# Patient Record
Sex: Female | Born: 1945 | Race: White | Hispanic: No | Marital: Married | Smoking: Former smoker
Health system: Southern US, Community
[De-identification: ages and names within clinical notes are randomized; demographics above are authoritative.]

## PROBLEM LIST (undated history)

## (undated) DIAGNOSIS — E119 Type 2 diabetes mellitus without complications: Secondary | ICD-10-CM

## (undated) DIAGNOSIS — N189 Chronic kidney disease, unspecified: Secondary | ICD-10-CM

## (undated) DIAGNOSIS — I10 Essential (primary) hypertension: Secondary | ICD-10-CM

## (undated) HISTORY — PX: BACK SURGERY: SHX140

## (undated) HISTORY — DX: Chronic kidney disease, unspecified: N18.9

## (undated) HISTORY — DX: Type 2 diabetes mellitus without complications: E11.9

## (undated) HISTORY — PX: BREAST BIOPSY: SHX20

## (undated) HISTORY — DX: Essential (primary) hypertension: I10

---

## 2014-07-14 DIAGNOSIS — E1149 Type 2 diabetes mellitus with other diabetic neurological complication: Secondary | ICD-10-CM | POA: Diagnosis not present

## 2014-07-14 DIAGNOSIS — E1165 Type 2 diabetes mellitus with hyperglycemia: Secondary | ICD-10-CM | POA: Diagnosis not present

## 2014-07-14 DIAGNOSIS — E782 Mixed hyperlipidemia: Secondary | ICD-10-CM | POA: Diagnosis not present

## 2014-07-14 DIAGNOSIS — I129 Hypertensive chronic kidney disease with stage 1 through stage 4 chronic kidney disease, or unspecified chronic kidney disease: Secondary | ICD-10-CM | POA: Diagnosis not present

## 2014-07-21 DIAGNOSIS — J189 Pneumonia, unspecified organism: Secondary | ICD-10-CM | POA: Diagnosis not present

## 2014-07-21 DIAGNOSIS — I129 Hypertensive chronic kidney disease with stage 1 through stage 4 chronic kidney disease, or unspecified chronic kidney disease: Secondary | ICD-10-CM | POA: Diagnosis not present

## 2014-07-21 DIAGNOSIS — E1165 Type 2 diabetes mellitus with hyperglycemia: Secondary | ICD-10-CM | POA: Diagnosis not present

## 2014-07-21 DIAGNOSIS — F17200 Nicotine dependence, unspecified, uncomplicated: Secondary | ICD-10-CM | POA: Diagnosis not present

## 2014-07-21 DIAGNOSIS — E1149 Type 2 diabetes mellitus with other diabetic neurological complication: Secondary | ICD-10-CM | POA: Diagnosis not present

## 2014-07-21 DIAGNOSIS — N183 Chronic kidney disease, stage 3 (moderate): Secondary | ICD-10-CM | POA: Diagnosis not present

## 2014-10-14 DIAGNOSIS — H5211 Myopia, right eye: Secondary | ICD-10-CM | POA: Diagnosis not present

## 2014-10-14 DIAGNOSIS — H52223 Regular astigmatism, bilateral: Secondary | ICD-10-CM | POA: Diagnosis not present

## 2014-10-14 DIAGNOSIS — H25813 Combined forms of age-related cataract, bilateral: Secondary | ICD-10-CM | POA: Diagnosis not present

## 2014-10-14 DIAGNOSIS — H43811 Vitreous degeneration, right eye: Secondary | ICD-10-CM | POA: Diagnosis not present

## 2014-10-14 DIAGNOSIS — I10 Essential (primary) hypertension: Secondary | ICD-10-CM | POA: Diagnosis not present

## 2014-10-14 DIAGNOSIS — H524 Presbyopia: Secondary | ICD-10-CM | POA: Diagnosis not present

## 2014-10-14 DIAGNOSIS — E119 Type 2 diabetes mellitus without complications: Secondary | ICD-10-CM | POA: Diagnosis not present

## 2014-10-28 DIAGNOSIS — N183 Chronic kidney disease, stage 3 (moderate): Secondary | ICD-10-CM | POA: Diagnosis not present

## 2014-10-28 DIAGNOSIS — N261 Atrophy of kidney (terminal): Secondary | ICD-10-CM | POA: Diagnosis not present

## 2014-10-28 DIAGNOSIS — I129 Hypertensive chronic kidney disease with stage 1 through stage 4 chronic kidney disease, or unspecified chronic kidney disease: Secondary | ICD-10-CM | POA: Diagnosis not present

## 2014-11-12 DIAGNOSIS — I129 Hypertensive chronic kidney disease with stage 1 through stage 4 chronic kidney disease, or unspecified chronic kidney disease: Secondary | ICD-10-CM | POA: Diagnosis not present

## 2014-11-12 DIAGNOSIS — E782 Mixed hyperlipidemia: Secondary | ICD-10-CM | POA: Diagnosis not present

## 2014-11-12 DIAGNOSIS — E1165 Type 2 diabetes mellitus with hyperglycemia: Secondary | ICD-10-CM | POA: Diagnosis not present

## 2014-11-12 DIAGNOSIS — E1149 Type 2 diabetes mellitus with other diabetic neurological complication: Secondary | ICD-10-CM | POA: Diagnosis not present

## 2014-11-17 DIAGNOSIS — E1149 Type 2 diabetes mellitus with other diabetic neurological complication: Secondary | ICD-10-CM | POA: Diagnosis not present

## 2014-11-17 DIAGNOSIS — N183 Chronic kidney disease, stage 3 (moderate): Secondary | ICD-10-CM | POA: Diagnosis not present

## 2014-11-17 DIAGNOSIS — E1165 Type 2 diabetes mellitus with hyperglycemia: Secondary | ICD-10-CM | POA: Diagnosis not present

## 2014-11-17 DIAGNOSIS — I129 Hypertensive chronic kidney disease with stage 1 through stage 4 chronic kidney disease, or unspecified chronic kidney disease: Secondary | ICD-10-CM | POA: Diagnosis not present

## 2015-02-10 DIAGNOSIS — E1149 Type 2 diabetes mellitus with other diabetic neurological complication: Secondary | ICD-10-CM | POA: Diagnosis not present

## 2015-02-10 DIAGNOSIS — E782 Mixed hyperlipidemia: Secondary | ICD-10-CM | POA: Diagnosis not present

## 2015-02-10 DIAGNOSIS — E1165 Type 2 diabetes mellitus with hyperglycemia: Secondary | ICD-10-CM | POA: Diagnosis not present

## 2015-02-10 DIAGNOSIS — I129 Hypertensive chronic kidney disease with stage 1 through stage 4 chronic kidney disease, or unspecified chronic kidney disease: Secondary | ICD-10-CM | POA: Diagnosis not present

## 2015-02-18 DIAGNOSIS — I129 Hypertensive chronic kidney disease with stage 1 through stage 4 chronic kidney disease, or unspecified chronic kidney disease: Secondary | ICD-10-CM | POA: Diagnosis not present

## 2015-02-18 DIAGNOSIS — E1165 Type 2 diabetes mellitus with hyperglycemia: Secondary | ICD-10-CM | POA: Diagnosis not present

## 2015-02-18 DIAGNOSIS — E1149 Type 2 diabetes mellitus with other diabetic neurological complication: Secondary | ICD-10-CM | POA: Diagnosis not present

## 2015-02-18 DIAGNOSIS — N183 Chronic kidney disease, stage 3 (moderate): Secondary | ICD-10-CM | POA: Diagnosis not present

## 2015-03-01 DIAGNOSIS — Z6827 Body mass index (BMI) 27.0-27.9, adult: Secondary | ICD-10-CM | POA: Diagnosis not present

## 2015-03-01 DIAGNOSIS — B370 Candidal stomatitis: Secondary | ICD-10-CM | POA: Diagnosis not present

## 2015-03-01 DIAGNOSIS — J029 Acute pharyngitis, unspecified: Secondary | ICD-10-CM | POA: Diagnosis not present

## 2015-04-18 DIAGNOSIS — Z23 Encounter for immunization: Secondary | ICD-10-CM | POA: Diagnosis not present

## 2015-07-05 DIAGNOSIS — E1149 Type 2 diabetes mellitus with other diabetic neurological complication: Secondary | ICD-10-CM | POA: Diagnosis not present

## 2015-07-05 DIAGNOSIS — E1165 Type 2 diabetes mellitus with hyperglycemia: Secondary | ICD-10-CM | POA: Diagnosis not present

## 2015-07-05 DIAGNOSIS — N183 Chronic kidney disease, stage 3 (moderate): Secondary | ICD-10-CM | POA: Diagnosis not present

## 2015-07-05 DIAGNOSIS — I129 Hypertensive chronic kidney disease with stage 1 through stage 4 chronic kidney disease, or unspecified chronic kidney disease: Secondary | ICD-10-CM | POA: Diagnosis not present

## 2015-07-05 DIAGNOSIS — E785 Hyperlipidemia, unspecified: Secondary | ICD-10-CM | POA: Diagnosis not present

## 2015-08-04 DIAGNOSIS — N183 Chronic kidney disease, stage 3 (moderate): Secondary | ICD-10-CM | POA: Diagnosis not present

## 2015-08-04 DIAGNOSIS — E1149 Type 2 diabetes mellitus with other diabetic neurological complication: Secondary | ICD-10-CM | POA: Diagnosis not present

## 2015-08-04 DIAGNOSIS — Z1389 Encounter for screening for other disorder: Secondary | ICD-10-CM | POA: Diagnosis not present

## 2015-08-04 DIAGNOSIS — E1165 Type 2 diabetes mellitus with hyperglycemia: Secondary | ICD-10-CM | POA: Diagnosis not present

## 2015-08-04 DIAGNOSIS — Z139 Encounter for screening, unspecified: Secondary | ICD-10-CM | POA: Diagnosis not present

## 2015-08-04 DIAGNOSIS — Z0000 Encounter for general adult medical examination without abnormal findings: Secondary | ICD-10-CM | POA: Diagnosis not present

## 2015-08-08 DIAGNOSIS — R809 Proteinuria, unspecified: Secondary | ICD-10-CM | POA: Diagnosis not present

## 2015-10-17 DIAGNOSIS — I129 Hypertensive chronic kidney disease with stage 1 through stage 4 chronic kidney disease, or unspecified chronic kidney disease: Secondary | ICD-10-CM | POA: Diagnosis not present

## 2015-10-17 DIAGNOSIS — N183 Chronic kidney disease, stage 3 (moderate): Secondary | ICD-10-CM | POA: Diagnosis not present

## 2015-10-17 DIAGNOSIS — R809 Proteinuria, unspecified: Secondary | ICD-10-CM | POA: Diagnosis not present

## 2015-10-17 DIAGNOSIS — N261 Atrophy of kidney (terminal): Secondary | ICD-10-CM | POA: Diagnosis not present

## 2015-11-03 DIAGNOSIS — E1149 Type 2 diabetes mellitus with other diabetic neurological complication: Secondary | ICD-10-CM | POA: Diagnosis not present

## 2015-11-03 DIAGNOSIS — R001 Bradycardia, unspecified: Secondary | ICD-10-CM | POA: Diagnosis not present

## 2015-11-03 DIAGNOSIS — E1165 Type 2 diabetes mellitus with hyperglycemia: Secondary | ICD-10-CM | POA: Diagnosis not present

## 2015-11-03 DIAGNOSIS — I129 Hypertensive chronic kidney disease with stage 1 through stage 4 chronic kidney disease, or unspecified chronic kidney disease: Secondary | ICD-10-CM | POA: Diagnosis not present

## 2015-11-03 DIAGNOSIS — E785 Hyperlipidemia, unspecified: Secondary | ICD-10-CM | POA: Diagnosis not present

## 2015-11-10 DIAGNOSIS — N289 Disorder of kidney and ureter, unspecified: Secondary | ICD-10-CM | POA: Diagnosis not present

## 2015-12-01 DIAGNOSIS — E1149 Type 2 diabetes mellitus with other diabetic neurological complication: Secondary | ICD-10-CM | POA: Diagnosis not present

## 2015-12-01 DIAGNOSIS — E785 Hyperlipidemia, unspecified: Secondary | ICD-10-CM | POA: Diagnosis not present

## 2015-12-01 DIAGNOSIS — E1165 Type 2 diabetes mellitus with hyperglycemia: Secondary | ICD-10-CM | POA: Diagnosis not present

## 2015-12-01 DIAGNOSIS — I129 Hypertensive chronic kidney disease with stage 1 through stage 4 chronic kidney disease, or unspecified chronic kidney disease: Secondary | ICD-10-CM | POA: Diagnosis not present

## 2015-12-19 DIAGNOSIS — I129 Hypertensive chronic kidney disease with stage 1 through stage 4 chronic kidney disease, or unspecified chronic kidney disease: Secondary | ICD-10-CM | POA: Diagnosis not present

## 2015-12-19 DIAGNOSIS — N183 Chronic kidney disease, stage 3 (moderate): Secondary | ICD-10-CM | POA: Diagnosis not present

## 2015-12-19 DIAGNOSIS — Z7189 Other specified counseling: Secondary | ICD-10-CM | POA: Diagnosis not present

## 2015-12-19 DIAGNOSIS — E1149 Type 2 diabetes mellitus with other diabetic neurological complication: Secondary | ICD-10-CM | POA: Diagnosis not present

## 2015-12-19 DIAGNOSIS — E1165 Type 2 diabetes mellitus with hyperglycemia: Secondary | ICD-10-CM | POA: Diagnosis not present

## 2015-12-23 DIAGNOSIS — R809 Proteinuria, unspecified: Secondary | ICD-10-CM | POA: Diagnosis not present

## 2015-12-23 DIAGNOSIS — I129 Hypertensive chronic kidney disease with stage 1 through stage 4 chronic kidney disease, or unspecified chronic kidney disease: Secondary | ICD-10-CM | POA: Diagnosis not present

## 2015-12-23 DIAGNOSIS — N261 Atrophy of kidney (terminal): Secondary | ICD-10-CM | POA: Diagnosis not present

## 2015-12-23 DIAGNOSIS — N183 Chronic kidney disease, stage 3 (moderate): Secondary | ICD-10-CM | POA: Diagnosis not present

## 2016-04-20 DIAGNOSIS — F17200 Nicotine dependence, unspecified, uncomplicated: Secondary | ICD-10-CM | POA: Diagnosis not present

## 2016-04-20 DIAGNOSIS — I129 Hypertensive chronic kidney disease with stage 1 through stage 4 chronic kidney disease, or unspecified chronic kidney disease: Secondary | ICD-10-CM | POA: Diagnosis not present

## 2016-04-20 DIAGNOSIS — E785 Hyperlipidemia, unspecified: Secondary | ICD-10-CM | POA: Diagnosis not present

## 2016-04-20 DIAGNOSIS — N183 Chronic kidney disease, stage 3 (moderate): Secondary | ICD-10-CM | POA: Diagnosis not present

## 2016-04-20 DIAGNOSIS — E1165 Type 2 diabetes mellitus with hyperglycemia: Secondary | ICD-10-CM | POA: Diagnosis not present

## 2016-04-20 DIAGNOSIS — E1149 Type 2 diabetes mellitus with other diabetic neurological complication: Secondary | ICD-10-CM | POA: Diagnosis not present

## 2016-04-20 DIAGNOSIS — Z23 Encounter for immunization: Secondary | ICD-10-CM | POA: Diagnosis not present

## 2016-05-01 DIAGNOSIS — I129 Hypertensive chronic kidney disease with stage 1 through stage 4 chronic kidney disease, or unspecified chronic kidney disease: Secondary | ICD-10-CM | POA: Diagnosis not present

## 2016-05-01 DIAGNOSIS — Z6829 Body mass index (BMI) 29.0-29.9, adult: Secondary | ICD-10-CM | POA: Diagnosis not present

## 2016-05-01 DIAGNOSIS — N183 Chronic kidney disease, stage 3 (moderate): Secondary | ICD-10-CM | POA: Diagnosis not present

## 2016-08-30 DIAGNOSIS — E1149 Type 2 diabetes mellitus with other diabetic neurological complication: Secondary | ICD-10-CM | POA: Diagnosis not present

## 2016-08-30 DIAGNOSIS — I129 Hypertensive chronic kidney disease with stage 1 through stage 4 chronic kidney disease, or unspecified chronic kidney disease: Secondary | ICD-10-CM | POA: Diagnosis not present

## 2016-08-30 DIAGNOSIS — E785 Hyperlipidemia, unspecified: Secondary | ICD-10-CM | POA: Diagnosis not present

## 2016-09-19 DIAGNOSIS — Z1389 Encounter for screening for other disorder: Secondary | ICD-10-CM | POA: Diagnosis not present

## 2016-09-19 DIAGNOSIS — E1122 Type 2 diabetes mellitus with diabetic chronic kidney disease: Secondary | ICD-10-CM | POA: Diagnosis not present

## 2016-09-19 DIAGNOSIS — Z0000 Encounter for general adult medical examination without abnormal findings: Secondary | ICD-10-CM | POA: Diagnosis not present

## 2016-09-19 DIAGNOSIS — N184 Chronic kidney disease, stage 4 (severe): Secondary | ICD-10-CM | POA: Diagnosis not present

## 2016-09-19 DIAGNOSIS — I129 Hypertensive chronic kidney disease with stage 1 through stage 4 chronic kidney disease, or unspecified chronic kidney disease: Secondary | ICD-10-CM | POA: Diagnosis not present

## 2016-09-19 DIAGNOSIS — Z139 Encounter for screening, unspecified: Secondary | ICD-10-CM | POA: Diagnosis not present

## 2016-09-19 DIAGNOSIS — E1121 Type 2 diabetes mellitus with diabetic nephropathy: Secondary | ICD-10-CM | POA: Diagnosis not present

## 2016-10-16 DIAGNOSIS — R05 Cough: Secondary | ICD-10-CM | POA: Diagnosis not present

## 2016-10-16 DIAGNOSIS — E1122 Type 2 diabetes mellitus with diabetic chronic kidney disease: Secondary | ICD-10-CM | POA: Diagnosis not present

## 2016-10-16 DIAGNOSIS — N184 Chronic kidney disease, stage 4 (severe): Secondary | ICD-10-CM | POA: Diagnosis not present

## 2016-10-16 DIAGNOSIS — I129 Hypertensive chronic kidney disease with stage 1 through stage 4 chronic kidney disease, or unspecified chronic kidney disease: Secondary | ICD-10-CM | POA: Diagnosis not present

## 2016-12-20 DIAGNOSIS — E785 Hyperlipidemia, unspecified: Secondary | ICD-10-CM | POA: Diagnosis not present

## 2016-12-20 DIAGNOSIS — E1121 Type 2 diabetes mellitus with diabetic nephropathy: Secondary | ICD-10-CM | POA: Diagnosis not present

## 2016-12-20 DIAGNOSIS — E1122 Type 2 diabetes mellitus with diabetic chronic kidney disease: Secondary | ICD-10-CM | POA: Diagnosis not present

## 2017-01-02 DIAGNOSIS — E1122 Type 2 diabetes mellitus with diabetic chronic kidney disease: Secondary | ICD-10-CM | POA: Diagnosis not present

## 2017-01-02 DIAGNOSIS — N183 Chronic kidney disease, stage 3 (moderate): Secondary | ICD-10-CM | POA: Diagnosis not present

## 2017-01-02 DIAGNOSIS — E785 Hyperlipidemia, unspecified: Secondary | ICD-10-CM | POA: Diagnosis not present

## 2017-01-02 DIAGNOSIS — I129 Hypertensive chronic kidney disease with stage 1 through stage 4 chronic kidney disease, or unspecified chronic kidney disease: Secondary | ICD-10-CM | POA: Diagnosis not present

## 2017-01-18 DIAGNOSIS — R42 Dizziness and giddiness: Secondary | ICD-10-CM | POA: Diagnosis not present

## 2017-01-18 DIAGNOSIS — I6523 Occlusion and stenosis of bilateral carotid arteries: Secondary | ICD-10-CM | POA: Diagnosis not present

## 2017-01-31 DIAGNOSIS — N183 Chronic kidney disease, stage 3 (moderate): Secondary | ICD-10-CM | POA: Diagnosis not present

## 2017-01-31 DIAGNOSIS — I129 Hypertensive chronic kidney disease with stage 1 through stage 4 chronic kidney disease, or unspecified chronic kidney disease: Secondary | ICD-10-CM | POA: Diagnosis not present

## 2017-01-31 DIAGNOSIS — N261 Atrophy of kidney (terminal): Secondary | ICD-10-CM | POA: Diagnosis not present

## 2017-01-31 DIAGNOSIS — R809 Proteinuria, unspecified: Secondary | ICD-10-CM | POA: Diagnosis not present

## 2017-02-18 DIAGNOSIS — R51 Headache: Secondary | ICD-10-CM | POA: Diagnosis not present

## 2017-02-18 DIAGNOSIS — E1121 Type 2 diabetes mellitus with diabetic nephropathy: Secondary | ICD-10-CM | POA: Diagnosis not present

## 2017-02-18 DIAGNOSIS — E1122 Type 2 diabetes mellitus with diabetic chronic kidney disease: Secondary | ICD-10-CM | POA: Diagnosis not present

## 2017-02-18 DIAGNOSIS — R42 Dizziness and giddiness: Secondary | ICD-10-CM | POA: Diagnosis not present

## 2017-02-18 DIAGNOSIS — Z139 Encounter for screening, unspecified: Secondary | ICD-10-CM | POA: Diagnosis not present

## 2017-02-18 DIAGNOSIS — Z1389 Encounter for screening for other disorder: Secondary | ICD-10-CM | POA: Diagnosis not present

## 2017-02-25 DIAGNOSIS — J302 Other seasonal allergic rhinitis: Secondary | ICD-10-CM | POA: Diagnosis not present

## 2017-02-25 DIAGNOSIS — J305 Allergic rhinitis due to food: Secondary | ICD-10-CM | POA: Diagnosis not present

## 2017-04-23 DIAGNOSIS — E1121 Type 2 diabetes mellitus with diabetic nephropathy: Secondary | ICD-10-CM | POA: Diagnosis not present

## 2017-04-23 DIAGNOSIS — E1122 Type 2 diabetes mellitus with diabetic chronic kidney disease: Secondary | ICD-10-CM | POA: Diagnosis not present

## 2017-04-23 DIAGNOSIS — E785 Hyperlipidemia, unspecified: Secondary | ICD-10-CM | POA: Diagnosis not present

## 2017-04-23 DIAGNOSIS — Z23 Encounter for immunization: Secondary | ICD-10-CM | POA: Diagnosis not present

## 2017-05-02 DIAGNOSIS — E1121 Type 2 diabetes mellitus with diabetic nephropathy: Secondary | ICD-10-CM | POA: Diagnosis not present

## 2017-05-02 DIAGNOSIS — E1122 Type 2 diabetes mellitus with diabetic chronic kidney disease: Secondary | ICD-10-CM | POA: Diagnosis not present

## 2017-05-02 DIAGNOSIS — I129 Hypertensive chronic kidney disease with stage 1 through stage 4 chronic kidney disease, or unspecified chronic kidney disease: Secondary | ICD-10-CM | POA: Diagnosis not present

## 2017-05-02 DIAGNOSIS — N183 Chronic kidney disease, stage 3 (moderate): Secondary | ICD-10-CM | POA: Diagnosis not present

## 2017-05-13 DIAGNOSIS — I10 Essential (primary) hypertension: Secondary | ICD-10-CM | POA: Diagnosis not present

## 2017-05-13 DIAGNOSIS — H5211 Myopia, right eye: Secondary | ICD-10-CM | POA: Diagnosis not present

## 2017-05-13 DIAGNOSIS — E119 Type 2 diabetes mellitus without complications: Secondary | ICD-10-CM | POA: Diagnosis not present

## 2017-05-13 DIAGNOSIS — H25813 Combined forms of age-related cataract, bilateral: Secondary | ICD-10-CM | POA: Diagnosis not present

## 2017-05-13 DIAGNOSIS — H524 Presbyopia: Secondary | ICD-10-CM | POA: Diagnosis not present

## 2017-05-13 DIAGNOSIS — H52223 Regular astigmatism, bilateral: Secondary | ICD-10-CM | POA: Diagnosis not present

## 2017-05-13 DIAGNOSIS — Z7984 Long term (current) use of oral hypoglycemic drugs: Secondary | ICD-10-CM | POA: Diagnosis not present

## 2017-07-26 DIAGNOSIS — E1121 Type 2 diabetes mellitus with diabetic nephropathy: Secondary | ICD-10-CM | POA: Diagnosis not present

## 2017-07-26 DIAGNOSIS — E1122 Type 2 diabetes mellitus with diabetic chronic kidney disease: Secondary | ICD-10-CM | POA: Diagnosis not present

## 2017-07-26 DIAGNOSIS — E785 Hyperlipidemia, unspecified: Secondary | ICD-10-CM | POA: Diagnosis not present

## 2017-08-02 DIAGNOSIS — Z6829 Body mass index (BMI) 29.0-29.9, adult: Secondary | ICD-10-CM | POA: Diagnosis not present

## 2017-08-02 DIAGNOSIS — R079 Chest pain, unspecified: Secondary | ICD-10-CM | POA: Diagnosis not present

## 2017-08-07 DIAGNOSIS — R9431 Abnormal electrocardiogram [ECG] [EKG]: Secondary | ICD-10-CM | POA: Diagnosis not present

## 2017-08-07 DIAGNOSIS — I429 Cardiomyopathy, unspecified: Secondary | ICD-10-CM | POA: Diagnosis not present

## 2017-08-07 DIAGNOSIS — R531 Weakness: Secondary | ICD-10-CM | POA: Diagnosis not present

## 2017-08-07 DIAGNOSIS — E119 Type 2 diabetes mellitus without complications: Secondary | ICD-10-CM | POA: Diagnosis not present

## 2017-08-07 DIAGNOSIS — I10 Essential (primary) hypertension: Secondary | ICD-10-CM | POA: Diagnosis not present

## 2017-08-07 DIAGNOSIS — E785 Hyperlipidemia, unspecified: Secondary | ICD-10-CM | POA: Diagnosis not present

## 2017-08-07 DIAGNOSIS — I2510 Atherosclerotic heart disease of native coronary artery without angina pectoris: Secondary | ICD-10-CM | POA: Diagnosis not present

## 2017-08-07 DIAGNOSIS — I208 Other forms of angina pectoris: Secondary | ICD-10-CM | POA: Diagnosis not present

## 2017-08-07 DIAGNOSIS — R0602 Shortness of breath: Secondary | ICD-10-CM | POA: Diagnosis not present

## 2017-08-12 DIAGNOSIS — I208 Other forms of angina pectoris: Secondary | ICD-10-CM | POA: Diagnosis not present

## 2017-08-12 DIAGNOSIS — E785 Hyperlipidemia, unspecified: Secondary | ICD-10-CM | POA: Diagnosis not present

## 2017-08-12 DIAGNOSIS — E119 Type 2 diabetes mellitus without complications: Secondary | ICD-10-CM | POA: Diagnosis not present

## 2017-08-12 DIAGNOSIS — I429 Cardiomyopathy, unspecified: Secondary | ICD-10-CM | POA: Diagnosis not present

## 2017-08-12 DIAGNOSIS — R531 Weakness: Secondary | ICD-10-CM | POA: Diagnosis not present

## 2017-08-12 DIAGNOSIS — R0602 Shortness of breath: Secondary | ICD-10-CM | POA: Diagnosis not present

## 2017-08-12 DIAGNOSIS — R9431 Abnormal electrocardiogram [ECG] [EKG]: Secondary | ICD-10-CM | POA: Diagnosis not present

## 2017-08-12 DIAGNOSIS — I10 Essential (primary) hypertension: Secondary | ICD-10-CM | POA: Diagnosis not present

## 2017-08-12 DIAGNOSIS — I2510 Atherosclerotic heart disease of native coronary artery without angina pectoris: Secondary | ICD-10-CM | POA: Diagnosis not present

## 2017-08-14 DIAGNOSIS — E119 Type 2 diabetes mellitus without complications: Secondary | ICD-10-CM | POA: Diagnosis not present

## 2017-08-14 DIAGNOSIS — R931 Abnormal findings on diagnostic imaging of heart and coronary circulation: Secondary | ICD-10-CM | POA: Diagnosis not present

## 2017-08-14 DIAGNOSIS — E785 Hyperlipidemia, unspecified: Secondary | ICD-10-CM | POA: Diagnosis not present

## 2017-08-14 DIAGNOSIS — I10 Essential (primary) hypertension: Secondary | ICD-10-CM | POA: Diagnosis not present

## 2017-08-14 DIAGNOSIS — Z8249 Family history of ischemic heart disease and other diseases of the circulatory system: Secondary | ICD-10-CM | POA: Diagnosis not present

## 2017-08-14 DIAGNOSIS — I208 Other forms of angina pectoris: Secondary | ICD-10-CM | POA: Diagnosis not present

## 2017-08-14 DIAGNOSIS — Z905 Acquired absence of kidney: Secondary | ICD-10-CM | POA: Diagnosis not present

## 2017-08-14 DIAGNOSIS — R9431 Abnormal electrocardiogram [ECG] [EKG]: Secondary | ICD-10-CM | POA: Diagnosis not present

## 2017-08-14 DIAGNOSIS — I2510 Atherosclerotic heart disease of native coronary artery without angina pectoris: Secondary | ICD-10-CM | POA: Diagnosis not present

## 2017-08-15 DIAGNOSIS — I25110 Atherosclerotic heart disease of native coronary artery with unstable angina pectoris: Secondary | ICD-10-CM | POA: Diagnosis not present

## 2017-08-15 DIAGNOSIS — R9439 Abnormal result of other cardiovascular function study: Secondary | ICD-10-CM | POA: Insufficient documentation

## 2017-08-15 DIAGNOSIS — N183 Chronic kidney disease, stage 3 (moderate): Secondary | ICD-10-CM | POA: Diagnosis not present

## 2017-08-15 DIAGNOSIS — K219 Gastro-esophageal reflux disease without esophagitis: Secondary | ICD-10-CM | POA: Diagnosis not present

## 2017-08-15 DIAGNOSIS — I129 Hypertensive chronic kidney disease with stage 1 through stage 4 chronic kidney disease, or unspecified chronic kidney disease: Secondary | ICD-10-CM | POA: Diagnosis not present

## 2017-08-15 DIAGNOSIS — E785 Hyperlipidemia, unspecified: Secondary | ICD-10-CM | POA: Diagnosis not present

## 2017-08-15 DIAGNOSIS — I700 Atherosclerosis of aorta: Secondary | ICD-10-CM | POA: Diagnosis not present

## 2017-08-15 DIAGNOSIS — E1122 Type 2 diabetes mellitus with diabetic chronic kidney disease: Secondary | ICD-10-CM | POA: Diagnosis not present

## 2017-08-15 DIAGNOSIS — R079 Chest pain, unspecified: Secondary | ICD-10-CM

## 2017-08-15 DIAGNOSIS — E049 Nontoxic goiter, unspecified: Secondary | ICD-10-CM | POA: Diagnosis not present

## 2017-08-15 HISTORY — DX: Chest pain, unspecified: R07.9

## 2017-08-15 HISTORY — DX: Abnormal result of other cardiovascular function study: R94.39

## 2017-08-16 DIAGNOSIS — E119 Type 2 diabetes mellitus without complications: Secondary | ICD-10-CM | POA: Insufficient documentation

## 2017-08-16 DIAGNOSIS — K219 Gastro-esophageal reflux disease without esophagitis: Secondary | ICD-10-CM

## 2017-08-16 DIAGNOSIS — N183 Chronic kidney disease, stage 3 unspecified: Secondary | ICD-10-CM | POA: Insufficient documentation

## 2017-08-16 DIAGNOSIS — E785 Hyperlipidemia, unspecified: Secondary | ICD-10-CM | POA: Insufficient documentation

## 2017-08-16 DIAGNOSIS — I200 Unstable angina: Secondary | ICD-10-CM | POA: Diagnosis not present

## 2017-08-16 DIAGNOSIS — I10 Essential (primary) hypertension: Secondary | ICD-10-CM

## 2017-08-16 DIAGNOSIS — N189 Chronic kidney disease, unspecified: Secondary | ICD-10-CM | POA: Insufficient documentation

## 2017-08-16 DIAGNOSIS — N179 Acute kidney failure, unspecified: Secondary | ICD-10-CM

## 2017-08-16 HISTORY — DX: Type 2 diabetes mellitus without complications: E11.9

## 2017-08-16 HISTORY — DX: Hyperlipidemia, unspecified: E78.5

## 2017-08-16 HISTORY — DX: Chronic kidney disease, unspecified: N17.9

## 2017-08-16 HISTORY — DX: Essential (primary) hypertension: I10

## 2017-08-16 HISTORY — DX: Gastro-esophageal reflux disease without esophagitis: K21.9

## 2017-08-17 DIAGNOSIS — E049 Nontoxic goiter, unspecified: Secondary | ICD-10-CM | POA: Insufficient documentation

## 2017-08-17 DIAGNOSIS — R0789 Other chest pain: Secondary | ICD-10-CM | POA: Diagnosis not present

## 2017-08-17 DIAGNOSIS — I10 Essential (primary) hypertension: Secondary | ICD-10-CM | POA: Diagnosis not present

## 2017-08-17 HISTORY — DX: Nontoxic goiter, unspecified: E04.9

## 2017-08-20 DIAGNOSIS — R062 Wheezing: Secondary | ICD-10-CM | POA: Diagnosis not present

## 2017-08-20 DIAGNOSIS — Z6829 Body mass index (BMI) 29.0-29.9, adult: Secondary | ICD-10-CM | POA: Diagnosis not present

## 2017-08-20 DIAGNOSIS — J101 Influenza due to other identified influenza virus with other respiratory manifestations: Secondary | ICD-10-CM | POA: Diagnosis not present

## 2017-08-23 DIAGNOSIS — E1122 Type 2 diabetes mellitus with diabetic chronic kidney disease: Secondary | ICD-10-CM | POA: Diagnosis not present

## 2017-08-23 DIAGNOSIS — R062 Wheezing: Secondary | ICD-10-CM | POA: Diagnosis not present

## 2017-08-23 DIAGNOSIS — I110 Hypertensive heart disease with heart failure: Secondary | ICD-10-CM | POA: Diagnosis not present

## 2017-08-23 DIAGNOSIS — I2510 Atherosclerotic heart disease of native coronary artery without angina pectoris: Secondary | ICD-10-CM | POA: Diagnosis not present

## 2017-08-23 DIAGNOSIS — R0689 Other abnormalities of breathing: Secondary | ICD-10-CM | POA: Diagnosis not present

## 2017-08-23 DIAGNOSIS — E876 Hypokalemia: Secondary | ICD-10-CM | POA: Diagnosis not present

## 2017-08-23 DIAGNOSIS — J9600 Acute respiratory failure, unspecified whether with hypoxia or hypercapnia: Secondary | ICD-10-CM | POA: Diagnosis not present

## 2017-08-23 DIAGNOSIS — J80 Acute respiratory distress syndrome: Secondary | ICD-10-CM | POA: Diagnosis not present

## 2017-08-23 DIAGNOSIS — Z87891 Personal history of nicotine dependence: Secondary | ICD-10-CM | POA: Diagnosis not present

## 2017-08-23 DIAGNOSIS — Z6829 Body mass index (BMI) 29.0-29.9, adult: Secondary | ICD-10-CM | POA: Diagnosis not present

## 2017-08-23 DIAGNOSIS — R918 Other nonspecific abnormal finding of lung field: Secondary | ICD-10-CM | POA: Diagnosis not present

## 2017-08-23 DIAGNOSIS — R0600 Dyspnea, unspecified: Secondary | ICD-10-CM | POA: Diagnosis not present

## 2017-08-23 DIAGNOSIS — R0781 Pleurodynia: Secondary | ICD-10-CM | POA: Diagnosis not present

## 2017-08-23 DIAGNOSIS — J9601 Acute respiratory failure with hypoxia: Secondary | ICD-10-CM | POA: Diagnosis not present

## 2017-08-23 DIAGNOSIS — I129 Hypertensive chronic kidney disease with stage 1 through stage 4 chronic kidney disease, or unspecified chronic kidney disease: Secondary | ICD-10-CM | POA: Diagnosis not present

## 2017-08-23 DIAGNOSIS — R05 Cough: Secondary | ICD-10-CM | POA: Diagnosis not present

## 2017-08-23 DIAGNOSIS — R109 Unspecified abdominal pain: Secondary | ICD-10-CM | POA: Diagnosis not present

## 2017-08-23 DIAGNOSIS — J111 Influenza due to unidentified influenza virus with other respiratory manifestations: Secondary | ICD-10-CM | POA: Diagnosis not present

## 2017-08-23 DIAGNOSIS — J1100 Influenza due to unidentified influenza virus with unspecified type of pneumonia: Secondary | ICD-10-CM | POA: Diagnosis not present

## 2017-08-23 DIAGNOSIS — J101 Influenza due to other identified influenza virus with other respiratory manifestations: Secondary | ICD-10-CM | POA: Diagnosis not present

## 2017-08-23 DIAGNOSIS — Z23 Encounter for immunization: Secondary | ICD-10-CM | POA: Diagnosis not present

## 2017-08-23 DIAGNOSIS — R740 Nonspecific elevation of levels of transaminase and lactic acid dehydrogenase [LDH]: Secondary | ICD-10-CM | POA: Diagnosis not present

## 2017-08-23 DIAGNOSIS — A419 Sepsis, unspecified organism: Secondary | ICD-10-CM | POA: Diagnosis not present

## 2017-08-26 DIAGNOSIS — E876 Hypokalemia: Secondary | ICD-10-CM | POA: Diagnosis not present

## 2017-08-26 DIAGNOSIS — J1100 Influenza due to unidentified influenza virus with unspecified type of pneumonia: Secondary | ICD-10-CM | POA: Diagnosis not present

## 2017-08-26 DIAGNOSIS — Z23 Encounter for immunization: Secondary | ICD-10-CM | POA: Diagnosis not present

## 2017-08-26 DIAGNOSIS — J9601 Acute respiratory failure with hypoxia: Secondary | ICD-10-CM | POA: Diagnosis not present

## 2017-08-26 DIAGNOSIS — R109 Unspecified abdominal pain: Secondary | ICD-10-CM | POA: Diagnosis not present

## 2017-08-26 DIAGNOSIS — A419 Sepsis, unspecified organism: Secondary | ICD-10-CM | POA: Diagnosis not present

## 2017-08-26 DIAGNOSIS — I2510 Atherosclerotic heart disease of native coronary artery without angina pectoris: Secondary | ICD-10-CM | POA: Diagnosis not present

## 2017-08-26 DIAGNOSIS — I129 Hypertensive chronic kidney disease with stage 1 through stage 4 chronic kidney disease, or unspecified chronic kidney disease: Secondary | ICD-10-CM | POA: Diagnosis not present

## 2017-08-26 DIAGNOSIS — Z87891 Personal history of nicotine dependence: Secondary | ICD-10-CM | POA: Diagnosis not present

## 2017-08-29 ENCOUNTER — Other Ambulatory Visit: Payer: Self-pay

## 2017-08-29 DIAGNOSIS — N179 Acute kidney failure, unspecified: Secondary | ICD-10-CM | POA: Diagnosis not present

## 2017-08-29 DIAGNOSIS — N189 Chronic kidney disease, unspecified: Secondary | ICD-10-CM | POA: Diagnosis not present

## 2017-08-29 DIAGNOSIS — J9600 Acute respiratory failure, unspecified whether with hypoxia or hypercapnia: Secondary | ICD-10-CM | POA: Diagnosis not present

## 2017-08-29 DIAGNOSIS — Z9189 Other specified personal risk factors, not elsewhere classified: Secondary | ICD-10-CM | POA: Diagnosis not present

## 2017-08-29 DIAGNOSIS — J09X1 Influenza due to identified novel influenza A virus with pneumonia: Secondary | ICD-10-CM | POA: Diagnosis not present

## 2017-08-29 NOTE — Patient Outreach (Signed)
[place] [facility]) Care Management  08/29/2017  Amber [name] [birth year] [id number]  [employer] Referral received on 08/28/17: discharged from an inpatient admission from [facility] on 08/26/17. RNCM called to complete transition of care. No answer. HIPPA compliant message left.  Plan: follow up next business day.  Thea Silversmith, RN, MSN, [place] Coordinator Cell: [phone]

## 2017-08-29 NOTE — Telephone Encounter (Signed)
This encounter was created in error - please disregard.

## 2017-08-29 NOTE — Addendum Note (Signed)
Addended by: Luretha Rued on: 08/29/2017 05:36 PM   Modules accepted: Level of Service, SmartSet

## 2017-08-30 ENCOUNTER — Other Ambulatory Visit: Payer: Self-pay

## 2017-08-30 NOTE — Patient Outreach (Signed)
[place] [facility]) Care Management  08/30/2017  Amber [name] 10-30-1945 [id number]     Transition of Care Referral  Referral Date: 08/28/17 Referral Source: [facility] Discharge Report Date of Admission: unknown Diagnosis: unknown Date of Discharge: 08/26/17 Facility: [place]: [facility]    Outreach attempt # 2 to patient. Spoke with patient. She voices she is resting and did not wish to talk long. She states that things are going "pretty good." She denies any worsening symptoms and/or issues. She voices that she has supportive spouse who is taking good care of her. She denies any issues or concerns regarding her meds. She voices she has picked up all her meds and understands when and how to take them. Her spouse took her to her MD follow up appt on yesterday. She is aware of how, when and who to contact for any changes in her condition.  She denies any further RN CM needs or concerns and voices no [facility]N needs. She is appreciative of call but does not feel that she needs services.     Plan: RN CM will notify [facility] administrative assistant of case status.    Enzo Montgomery, RN,BSN,CCM [place] Management Telephonic Care Management Coordinator Direct Phone: [phone] Toll Free: [phone] Fax: [phone]

## 2017-09-05 DIAGNOSIS — J09X1 Influenza due to identified novel influenza A virus with pneumonia: Secondary | ICD-10-CM | POA: Diagnosis not present

## 2017-09-05 DIAGNOSIS — D729 Disorder of white blood cells, unspecified: Secondary | ICD-10-CM | POA: Diagnosis not present

## 2017-09-05 DIAGNOSIS — Z6829 Body mass index (BMI) 29.0-29.9, adult: Secondary | ICD-10-CM | POA: Diagnosis not present

## 2017-09-05 DIAGNOSIS — N390 Urinary tract infection, site not specified: Secondary | ICD-10-CM | POA: Diagnosis not present

## 2017-09-12 DIAGNOSIS — Z0000 Encounter for general adult medical examination without abnormal findings: Secondary | ICD-10-CM | POA: Diagnosis not present

## 2017-09-12 DIAGNOSIS — Z1331 Encounter for screening for depression: Secondary | ICD-10-CM | POA: Diagnosis not present

## 2017-09-12 DIAGNOSIS — Z139 Encounter for screening, unspecified: Secondary | ICD-10-CM | POA: Diagnosis not present

## 2017-09-12 DIAGNOSIS — D729 Disorder of white blood cells, unspecified: Secondary | ICD-10-CM | POA: Diagnosis not present

## 2017-09-19 DIAGNOSIS — D729 Disorder of white blood cells, unspecified: Secondary | ICD-10-CM | POA: Diagnosis not present

## 2017-09-19 DIAGNOSIS — Z6829 Body mass index (BMI) 29.0-29.9, adult: Secondary | ICD-10-CM | POA: Diagnosis not present

## 2017-11-11 DIAGNOSIS — H25813 Combined forms of age-related cataract, bilateral: Secondary | ICD-10-CM | POA: Diagnosis not present

## 2018-03-20 DIAGNOSIS — Z23 Encounter for immunization: Secondary | ICD-10-CM | POA: Diagnosis not present

## 2018-03-20 DIAGNOSIS — Z6830 Body mass index (BMI) 30.0-30.9, adult: Secondary | ICD-10-CM | POA: Diagnosis not present

## 2018-03-20 DIAGNOSIS — E1121 Type 2 diabetes mellitus with diabetic nephropathy: Secondary | ICD-10-CM | POA: Diagnosis not present

## 2018-03-20 DIAGNOSIS — R9389 Abnormal findings on diagnostic imaging of other specified body structures: Secondary | ICD-10-CM | POA: Diagnosis not present

## 2018-03-26 DIAGNOSIS — N183 Chronic kidney disease, stage 3 (moderate): Secondary | ICD-10-CM | POA: Diagnosis not present

## 2018-03-26 DIAGNOSIS — I129 Hypertensive chronic kidney disease with stage 1 through stage 4 chronic kidney disease, or unspecified chronic kidney disease: Secondary | ICD-10-CM | POA: Diagnosis not present

## 2018-03-26 DIAGNOSIS — R809 Proteinuria, unspecified: Secondary | ICD-10-CM | POA: Diagnosis not present

## 2018-03-26 DIAGNOSIS — N261 Atrophy of kidney (terminal): Secondary | ICD-10-CM | POA: Diagnosis not present

## 2018-03-28 DIAGNOSIS — E1121 Type 2 diabetes mellitus with diabetic nephropathy: Secondary | ICD-10-CM | POA: Diagnosis not present

## 2018-03-28 DIAGNOSIS — E785 Hyperlipidemia, unspecified: Secondary | ICD-10-CM | POA: Diagnosis not present

## 2018-03-28 DIAGNOSIS — E1122 Type 2 diabetes mellitus with diabetic chronic kidney disease: Secondary | ICD-10-CM | POA: Diagnosis not present

## 2018-04-01 DIAGNOSIS — R9389 Abnormal findings on diagnostic imaging of other specified body structures: Secondary | ICD-10-CM | POA: Diagnosis not present

## 2018-04-01 DIAGNOSIS — I700 Atherosclerosis of aorta: Secondary | ICD-10-CM | POA: Diagnosis not present

## 2018-04-01 DIAGNOSIS — J984 Other disorders of lung: Secondary | ICD-10-CM | POA: Diagnosis not present

## 2018-04-01 DIAGNOSIS — I2510 Atherosclerotic heart disease of native coronary artery without angina pectoris: Secondary | ICD-10-CM | POA: Diagnosis not present

## 2018-04-01 DIAGNOSIS — R918 Other nonspecific abnormal finding of lung field: Secondary | ICD-10-CM | POA: Diagnosis not present

## 2018-04-04 DIAGNOSIS — E1121 Type 2 diabetes mellitus with diabetic nephropathy: Secondary | ICD-10-CM | POA: Diagnosis not present

## 2018-04-04 DIAGNOSIS — E1122 Type 2 diabetes mellitus with diabetic chronic kidney disease: Secondary | ICD-10-CM | POA: Diagnosis not present

## 2018-04-04 DIAGNOSIS — N183 Chronic kidney disease, stage 3 (moderate): Secondary | ICD-10-CM | POA: Diagnosis not present

## 2018-04-04 DIAGNOSIS — I129 Hypertensive chronic kidney disease with stage 1 through stage 4 chronic kidney disease, or unspecified chronic kidney disease: Secondary | ICD-10-CM | POA: Diagnosis not present

## 2018-07-28 DIAGNOSIS — E1122 Type 2 diabetes mellitus with diabetic chronic kidney disease: Secondary | ICD-10-CM | POA: Diagnosis not present

## 2018-07-28 DIAGNOSIS — E1121 Type 2 diabetes mellitus with diabetic nephropathy: Secondary | ICD-10-CM | POA: Diagnosis not present

## 2018-07-28 DIAGNOSIS — E785 Hyperlipidemia, unspecified: Secondary | ICD-10-CM | POA: Diagnosis not present

## 2018-08-07 DIAGNOSIS — Z1331 Encounter for screening for depression: Secondary | ICD-10-CM | POA: Diagnosis not present

## 2018-08-07 DIAGNOSIS — E1121 Type 2 diabetes mellitus with diabetic nephropathy: Secondary | ICD-10-CM | POA: Diagnosis not present

## 2018-08-07 DIAGNOSIS — Z0000 Encounter for general adult medical examination without abnormal findings: Secondary | ICD-10-CM | POA: Diagnosis not present

## 2018-08-07 DIAGNOSIS — N183 Chronic kidney disease, stage 3 (moderate): Secondary | ICD-10-CM | POA: Diagnosis not present

## 2018-08-07 DIAGNOSIS — I129 Hypertensive chronic kidney disease with stage 1 through stage 4 chronic kidney disease, or unspecified chronic kidney disease: Secondary | ICD-10-CM | POA: Diagnosis not present

## 2018-08-07 DIAGNOSIS — E1122 Type 2 diabetes mellitus with diabetic chronic kidney disease: Secondary | ICD-10-CM | POA: Diagnosis not present

## 2018-08-07 DIAGNOSIS — Z139 Encounter for screening, unspecified: Secondary | ICD-10-CM | POA: Diagnosis not present

## 2018-09-05 DIAGNOSIS — N183 Chronic kidney disease, stage 3 (moderate): Secondary | ICD-10-CM | POA: Diagnosis not present

## 2018-09-05 DIAGNOSIS — E1122 Type 2 diabetes mellitus with diabetic chronic kidney disease: Secondary | ICD-10-CM | POA: Diagnosis not present

## 2018-09-05 DIAGNOSIS — I25119 Atherosclerotic heart disease of native coronary artery with unspecified angina pectoris: Secondary | ICD-10-CM | POA: Diagnosis not present

## 2018-09-05 DIAGNOSIS — I129 Hypertensive chronic kidney disease with stage 1 through stage 4 chronic kidney disease, or unspecified chronic kidney disease: Secondary | ICD-10-CM | POA: Diagnosis not present

## 2018-09-10 DIAGNOSIS — E669 Obesity, unspecified: Secondary | ICD-10-CM | POA: Diagnosis not present

## 2018-09-10 DIAGNOSIS — Z6830 Body mass index (BMI) 30.0-30.9, adult: Secondary | ICD-10-CM | POA: Diagnosis not present

## 2018-11-26 DIAGNOSIS — E785 Hyperlipidemia, unspecified: Secondary | ICD-10-CM | POA: Diagnosis not present

## 2018-11-26 DIAGNOSIS — E1121 Type 2 diabetes mellitus with diabetic nephropathy: Secondary | ICD-10-CM | POA: Diagnosis not present

## 2018-11-26 DIAGNOSIS — E1122 Type 2 diabetes mellitus with diabetic chronic kidney disease: Secondary | ICD-10-CM | POA: Diagnosis not present

## 2018-12-11 DIAGNOSIS — I129 Hypertensive chronic kidney disease with stage 1 through stage 4 chronic kidney disease, or unspecified chronic kidney disease: Secondary | ICD-10-CM | POA: Diagnosis not present

## 2018-12-11 DIAGNOSIS — E1122 Type 2 diabetes mellitus with diabetic chronic kidney disease: Secondary | ICD-10-CM | POA: Diagnosis not present

## 2018-12-11 DIAGNOSIS — E1121 Type 2 diabetes mellitus with diabetic nephropathy: Secondary | ICD-10-CM | POA: Diagnosis not present

## 2018-12-11 DIAGNOSIS — N183 Chronic kidney disease, stage 3 (moderate): Secondary | ICD-10-CM | POA: Diagnosis not present

## 2018-12-19 DIAGNOSIS — M8580 Other specified disorders of bone density and structure, unspecified site: Secondary | ICD-10-CM

## 2018-12-19 DIAGNOSIS — Z780 Asymptomatic menopausal state: Secondary | ICD-10-CM | POA: Insufficient documentation

## 2018-12-19 HISTORY — DX: Other specified disorders of bone density and structure, unspecified site: M85.80

## 2018-12-31 DIAGNOSIS — E2839 Other primary ovarian failure: Secondary | ICD-10-CM | POA: Diagnosis not present

## 2018-12-31 DIAGNOSIS — M8588 Other specified disorders of bone density and structure, other site: Secondary | ICD-10-CM | POA: Diagnosis not present

## 2018-12-31 DIAGNOSIS — Z1231 Encounter for screening mammogram for malignant neoplasm of breast: Secondary | ICD-10-CM | POA: Diagnosis not present

## 2018-12-31 DIAGNOSIS — M85852 Other specified disorders of bone density and structure, left thigh: Secondary | ICD-10-CM | POA: Diagnosis not present

## 2019-01-08 DIAGNOSIS — R928 Other abnormal and inconclusive findings on diagnostic imaging of breast: Secondary | ICD-10-CM | POA: Diagnosis not present

## 2019-01-08 DIAGNOSIS — R922 Inconclusive mammogram: Secondary | ICD-10-CM | POA: Diagnosis not present

## 2019-01-08 DIAGNOSIS — N6321 Unspecified lump in the left breast, upper outer quadrant: Secondary | ICD-10-CM | POA: Diagnosis not present

## 2019-01-08 DIAGNOSIS — N6002 Solitary cyst of left breast: Secondary | ICD-10-CM | POA: Diagnosis not present

## 2019-01-08 DIAGNOSIS — N6311 Unspecified lump in the right breast, upper outer quadrant: Secondary | ICD-10-CM | POA: Diagnosis not present

## 2019-01-15 DIAGNOSIS — N6311 Unspecified lump in the right breast, upper outer quadrant: Secondary | ICD-10-CM | POA: Diagnosis not present

## 2019-01-15 DIAGNOSIS — D0511 Intraductal carcinoma in situ of right breast: Secondary | ICD-10-CM | POA: Diagnosis not present

## 2019-01-15 DIAGNOSIS — R922 Inconclusive mammogram: Secondary | ICD-10-CM | POA: Diagnosis not present

## 2019-01-15 DIAGNOSIS — R928 Other abnormal and inconclusive findings on diagnostic imaging of breast: Secondary | ICD-10-CM | POA: Diagnosis not present

## 2019-01-20 DIAGNOSIS — C50211 Malignant neoplasm of upper-inner quadrant of right female breast: Secondary | ICD-10-CM | POA: Insufficient documentation

## 2019-01-20 HISTORY — DX: Malignant neoplasm of upper-inner quadrant of right female breast: C50.211

## 2019-01-28 DIAGNOSIS — C50919 Malignant neoplasm of unspecified site of unspecified female breast: Secondary | ICD-10-CM | POA: Diagnosis not present

## 2019-01-28 DIAGNOSIS — Z6827 Body mass index (BMI) 27.0-27.9, adult: Secondary | ICD-10-CM | POA: Diagnosis not present

## 2019-01-30 DIAGNOSIS — D0511 Intraductal carcinoma in situ of right breast: Secondary | ICD-10-CM | POA: Diagnosis not present

## 2019-02-04 ENCOUNTER — Other Ambulatory Visit: Payer: Self-pay | Admitting: Surgery

## 2019-02-04 DIAGNOSIS — C50911 Malignant neoplasm of unspecified site of right female breast: Secondary | ICD-10-CM

## 2019-02-04 DIAGNOSIS — Z170 Estrogen receptor positive status [ER+]: Secondary | ICD-10-CM

## 2019-02-04 DIAGNOSIS — R928 Other abnormal and inconclusive findings on diagnostic imaging of breast: Secondary | ICD-10-CM | POA: Diagnosis not present

## 2019-02-04 DIAGNOSIS — N6314 Unspecified lump in the right breast, lower inner quadrant: Secondary | ICD-10-CM | POA: Diagnosis not present

## 2019-02-04 DIAGNOSIS — N6315 Unspecified lump in the right breast, overlapping quadrants: Secondary | ICD-10-CM | POA: Diagnosis not present

## 2019-02-04 DIAGNOSIS — D241 Benign neoplasm of right breast: Secondary | ICD-10-CM | POA: Diagnosis not present

## 2019-02-04 DIAGNOSIS — C50919 Malignant neoplasm of unspecified site of unspecified female breast: Secondary | ICD-10-CM | POA: Diagnosis not present

## 2019-02-09 ENCOUNTER — Other Ambulatory Visit: Payer: Self-pay | Admitting: Surgery

## 2019-02-09 ENCOUNTER — Ambulatory Visit
Admission: RE | Admit: 2019-02-09 | Discharge: 2019-02-09 | Disposition: A | Discharge status: Home / Self Care | Payer: Medicare HMO | Source: Ambulatory Visit | Attending: Surgery | Admitting: Surgery

## 2019-02-09 DIAGNOSIS — N6002 Solitary cyst of left breast: Secondary | ICD-10-CM | POA: Diagnosis not present

## 2019-02-09 DIAGNOSIS — R9389 Abnormal findings on diagnostic imaging of other specified body structures: Secondary | ICD-10-CM

## 2019-02-09 DIAGNOSIS — C50911 Malignant neoplasm of unspecified site of right female breast: Secondary | ICD-10-CM

## 2019-02-09 DIAGNOSIS — D0511 Intraductal carcinoma in situ of right breast: Secondary | ICD-10-CM | POA: Diagnosis not present

## 2019-02-09 MED ORDER — GADOBUTROL 1 MMOL/ML IV SOLN
6.0000 mL | Freq: Once | INTRAVENOUS | Status: AC | PRN
  Administered 2019-02-09: 6 mL via INTRAVENOUS

## 2019-02-16 ENCOUNTER — Other Ambulatory Visit: Payer: Medicare HMO

## 2019-02-18 ENCOUNTER — Other Ambulatory Visit: Payer: Self-pay

## 2019-02-18 ENCOUNTER — Ambulatory Visit
Admission: RE | Admit: 2019-02-18 | Discharge: 2019-02-18 | Disposition: A | Discharge status: Home / Self Care | Payer: Medicare HMO | Source: Ambulatory Visit | Attending: Surgery | Admitting: Surgery

## 2019-02-18 DIAGNOSIS — N6312 Unspecified lump in the right breast, upper inner quadrant: Secondary | ICD-10-CM | POA: Diagnosis not present

## 2019-02-18 DIAGNOSIS — R9389 Abnormal findings on diagnostic imaging of other specified body structures: Secondary | ICD-10-CM

## 2019-02-18 DIAGNOSIS — C50211 Malignant neoplasm of upper-inner quadrant of right female breast: Secondary | ICD-10-CM | POA: Diagnosis not present

## 2019-02-18 IMAGING — MR MR BREAST BX W LOC DEV 1ST LESION IMAGE BX SPEC MR GUIDE*R*
5 of 6 series · 33 of 48 positions shown · IV contrast (6 ml gadavist)
Comparison: Previous exams.
COMPARISON: Previous exams.

Addendum:
CLINICAL DATA: Biopsy of a mass in the upper inner right breast.

EXAM:
MRI GUIDED CORE NEEDLE BIOPSY OF THE RIGHT BREAST
TECHNIQUE: Multiplanar, multisequence MR imaging of the right breast was
performed both before and after administration of intravenous
contrast.
CONTRAST:  6 mL Gadavist

[Series 2: fiducial unilateral · sagittal · 2.0mm · 1.33mm/px · 3 of 52 slices shown]
[im 1/52]
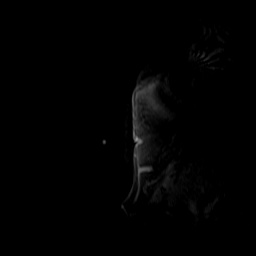
[im 26/52]
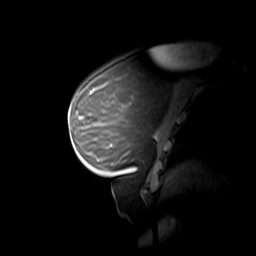
[im 52/52]
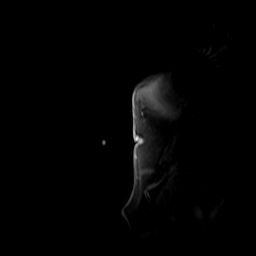

[Series 3: dynamic pre · axial · non-contrast · 1.3mm · 0.73mm/px · z∈[-89,+97]mm · 9 of 144 slices shown]
[im 1/144]
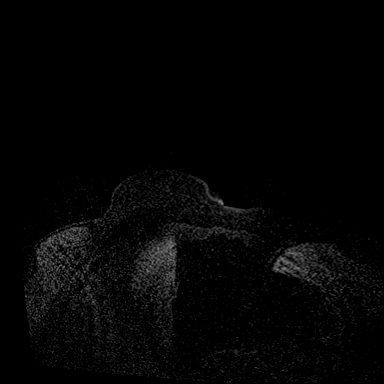
[im 18/144]
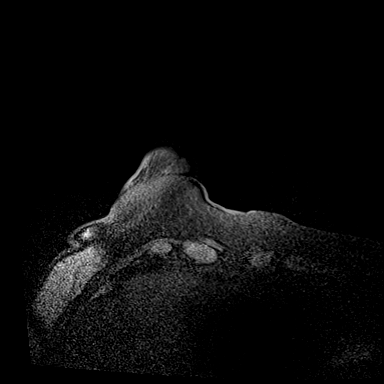
[im 36/144]
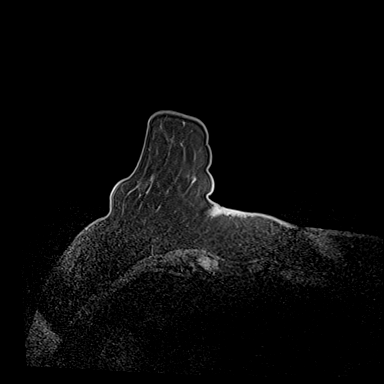
[im 54/144]
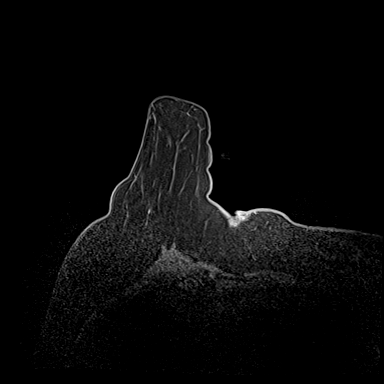
[im 72/144]
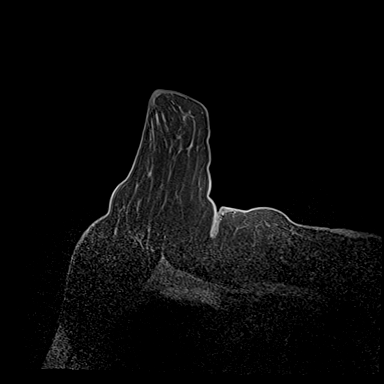
[im 90/144]
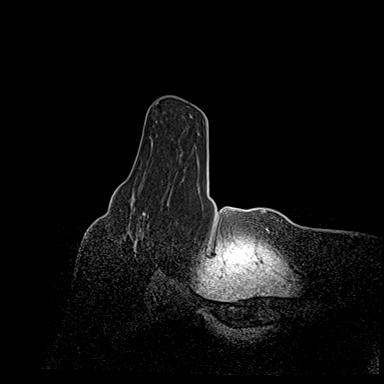
[im 108/144]
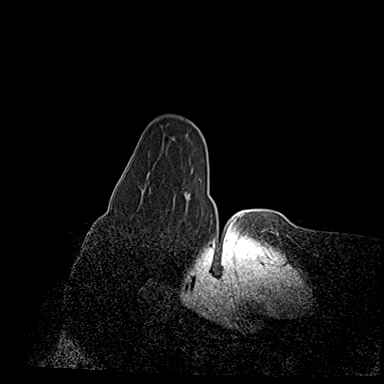
[im 126/144]
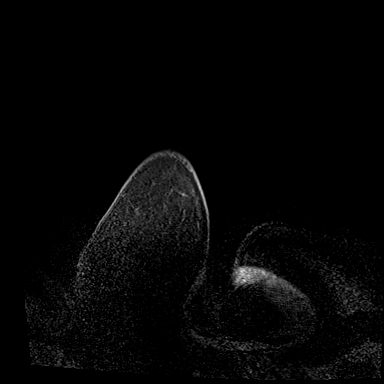
[im 144/144]
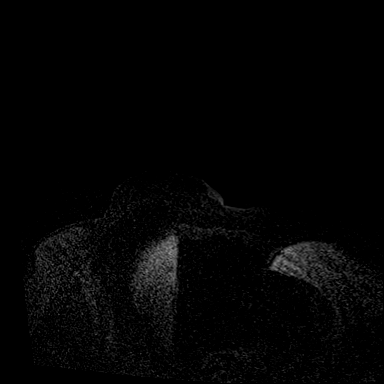

[Series 4: dynamic post 20 · axial · 1.3mm · 0.73mm/px · z∈[-89,+97]mm · 9 of 144 slices shown (1 of 2)]
[im 1/144]
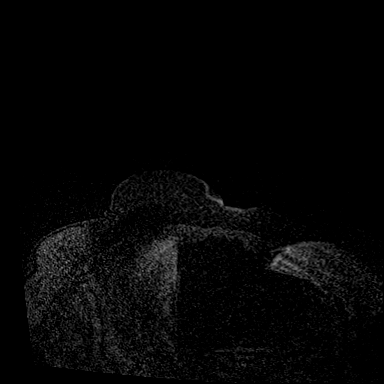
[im 18/144]
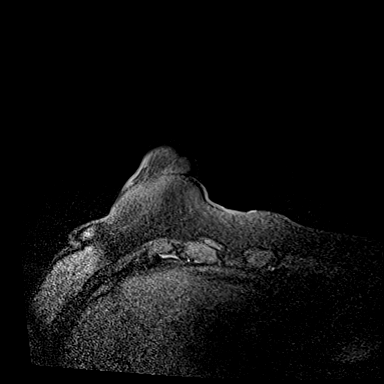
[im 36/144]
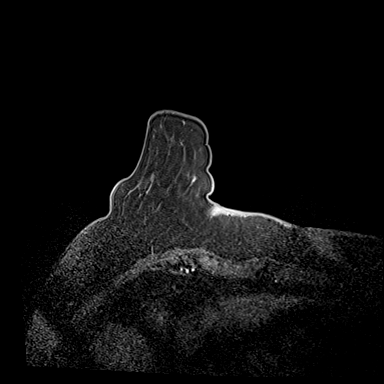
[im 54/144]
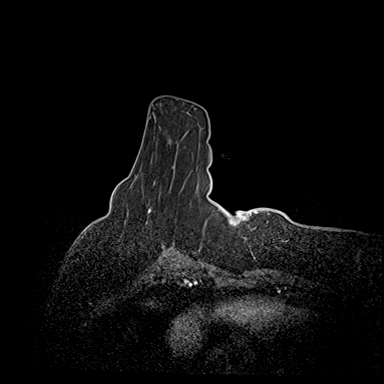
[im 72/144]
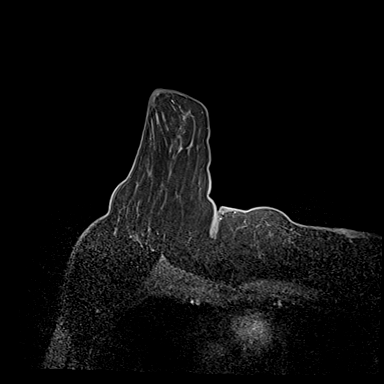
[im 90/144]
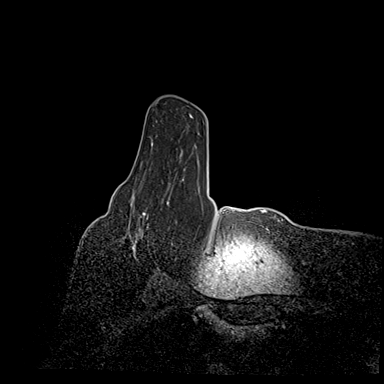
[im 108/144]
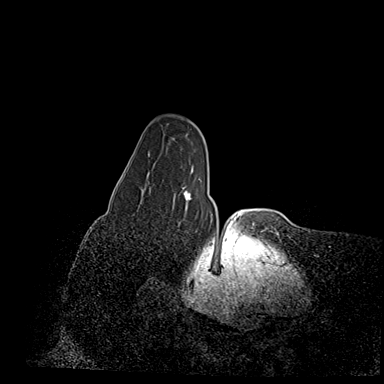
[im 126/144]
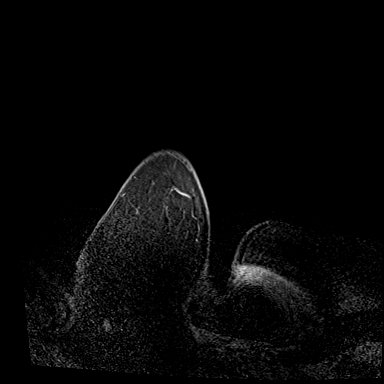
[im 144/144]
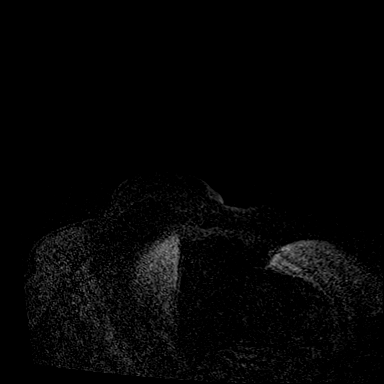

[Series 5: dynamic post 20 · axial · 1.3mm · 0.73mm/px · z∈[-89,+97]mm · 9 of 144 slices shown (2 of 2)]
[im 1/144]
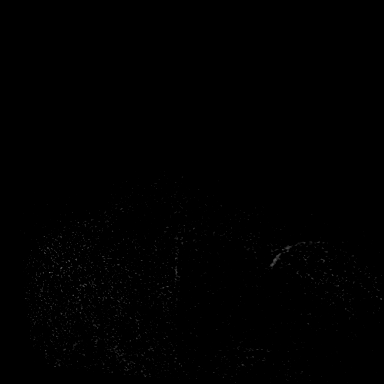
[im 18/144]
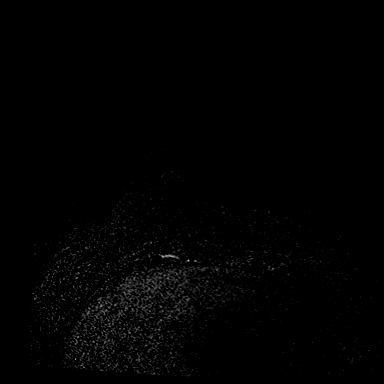
[im 36/144]
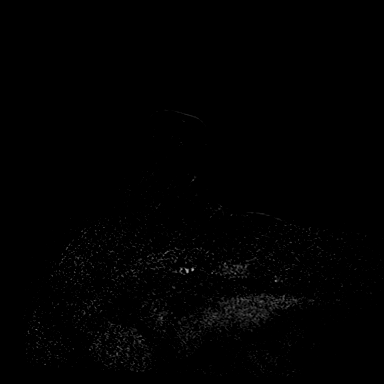
[im 54/144]
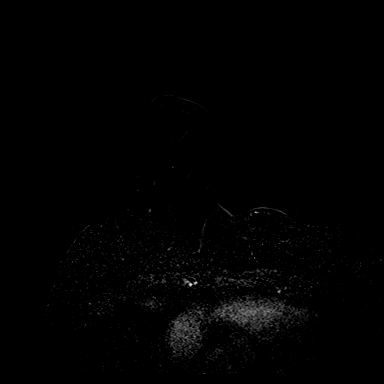
[im 72/144]
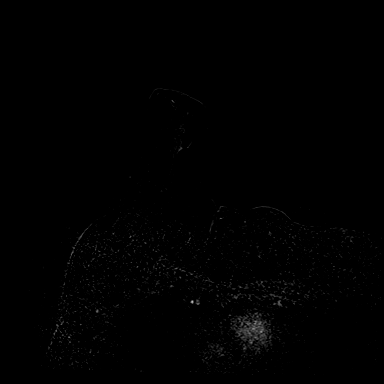
[im 90/144]
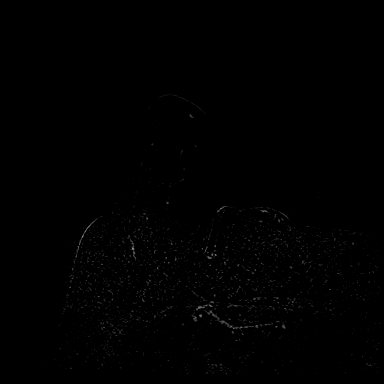
[im 108/144]
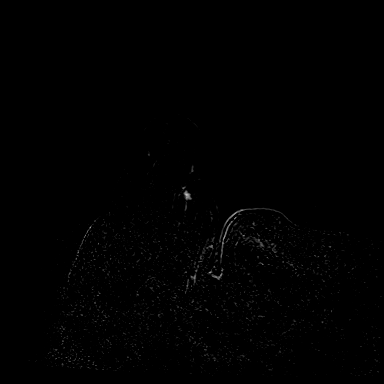
[im 126/144]
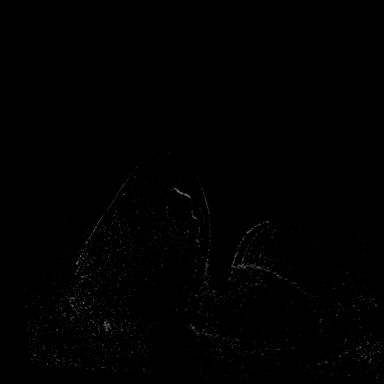
[im 144/144]
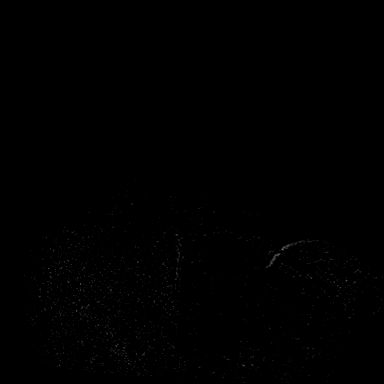

[Series 6: dynamic post 3 · axial · 1.3mm · 0.73mm/px · z∈[-89,-43]mm · 3 of 144 slices shown]
[im 1/144]
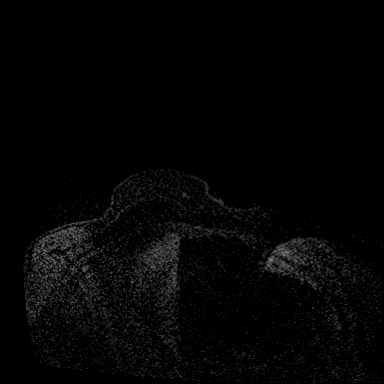
[im 18/144]
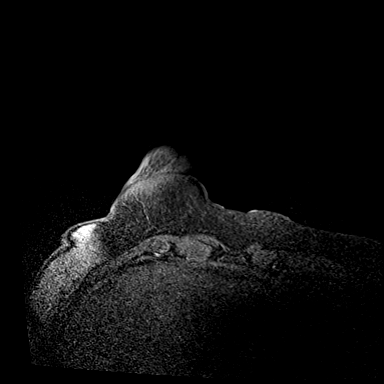
[im 36/144]
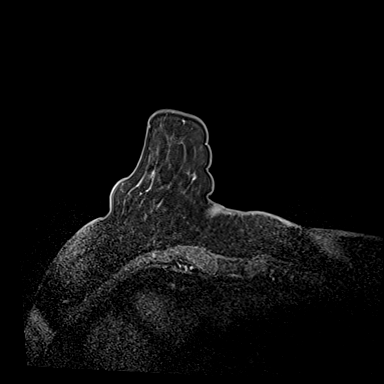

[33 of 48 positions shown; findings below may reference images not displayed]

FINDINGS: I met with the patient, and we discussed the procedure of MRI guided
biopsy, including risks, benefits, and alternatives. Specifically,
we discussed the risks of infection, bleeding, tissue injury, clip
migration, and inadequate sampling. Informed, written consent was
given. The usual time out protocol was performed immediately prior
to the procedure.

Using sterile technique, 1% Lidocaine, MRI guidance, and a 9 gauge
vacuum assisted device, biopsy was performed of a right breast mass
in the upper inner quadrant using a medial approach. At the
conclusion of the procedure, a tissue marker clip was deployed into
the biopsy cavity. Follow-up 2-view mammogram was performed and
dictated separately.
IMPRESSION: MRI guided biopsy of a right breast mass in the upper inner
quadrant. No apparent complications.

ADDENDUM:
Pathology revealed GRADE I INVASIVE DUCTAL CARCINOMA, DUCTAL
CARCINOMA IN SITU of the RIGHT breast, upper inner. This was found
to be concordant by Dr. YACOB.

Pathology results were discussed with the patient by telephone. The
patient reported doing well after the biopsy with firmness and large
amount of resolving swelling at the site. Dr. [REDACTED]
in [HOSPITAL] was notified and will call patient in a day early if she
needs to be seen. (She has scheduled appointment with Dr. YACOB on
[DATE].) Post biopsy instructions and care were reviewed and
questions were answered. The patient was encouraged to call The

The patient has a recent diagnosis of right breast cancer and should
follow her outlined treatment plan. As noted on the prior diagnostic
mammography and ultrasound from [DATE], six-month follow-up
diagnostic mammography and ultrasound of the LEFT breast is
recommended for the probably benign masses.

Pathology results reported by YACOB, RN on [DATE].

*** End of Addendum ***
FINDINGS: I met with the patient, and we discussed the procedure of MRI guided
biopsy, including risks, benefits, and alternatives. Specifically,
we discussed the risks of infection, bleeding, tissue injury, clip
migration, and inadequate sampling. Informed, written consent was
given. The usual time out protocol was performed immediately prior
to the procedure.

Using sterile technique, 1% Lidocaine, MRI guidance, and a 9 gauge
vacuum assisted device, biopsy was performed of a right breast mass
in the upper inner quadrant using a medial approach. At the
conclusion of the procedure, a tissue marker clip was deployed into
the biopsy cavity. Follow-up 2-view mammogram was performed and
dictated separately.
IMPRESSION: MRI guided biopsy of a right breast mass in the upper inner
quadrant. No apparent complications.

## 2019-02-18 MED ORDER — GADOBUTROL 1 MMOL/ML IV SOLN
6.0000 mL | Freq: Once | INTRAVENOUS | Status: AC | PRN
  Administered 2019-02-18: 09:00:00 6 mL via INTRAVENOUS

## 2019-02-19 DIAGNOSIS — N6489 Other specified disorders of breast: Secondary | ICD-10-CM | POA: Diagnosis not present

## 2019-02-25 DIAGNOSIS — C50911 Malignant neoplasm of unspecified site of right female breast: Secondary | ICD-10-CM | POA: Diagnosis not present

## 2019-02-25 DIAGNOSIS — D0511 Intraductal carcinoma in situ of right breast: Secondary | ICD-10-CM | POA: Diagnosis not present

## 2019-03-09 DIAGNOSIS — E1122 Type 2 diabetes mellitus with diabetic chronic kidney disease: Secondary | ICD-10-CM | POA: Diagnosis not present

## 2019-03-09 DIAGNOSIS — R809 Proteinuria, unspecified: Secondary | ICD-10-CM | POA: Diagnosis not present

## 2019-03-09 DIAGNOSIS — N261 Atrophy of kidney (terminal): Secondary | ICD-10-CM | POA: Diagnosis not present

## 2019-03-09 DIAGNOSIS — N183 Chronic kidney disease, stage 3 (moderate): Secondary | ICD-10-CM | POA: Diagnosis not present

## 2019-03-09 DIAGNOSIS — I129 Hypertensive chronic kidney disease with stage 1 through stage 4 chronic kidney disease, or unspecified chronic kidney disease: Secondary | ICD-10-CM | POA: Diagnosis not present

## 2019-03-23 DIAGNOSIS — C50411 Malignant neoplasm of upper-outer quadrant of right female breast: Secondary | ICD-10-CM | POA: Diagnosis not present

## 2019-03-23 DIAGNOSIS — K219 Gastro-esophageal reflux disease without esophagitis: Secondary | ICD-10-CM | POA: Diagnosis not present

## 2019-03-23 DIAGNOSIS — Q600 Renal agenesis, unilateral: Secondary | ICD-10-CM | POA: Diagnosis not present

## 2019-03-23 DIAGNOSIS — Z170 Estrogen receptor positive status [ER+]: Secondary | ICD-10-CM | POA: Diagnosis not present

## 2019-03-23 DIAGNOSIS — E7800 Pure hypercholesterolemia, unspecified: Secondary | ICD-10-CM | POA: Diagnosis not present

## 2019-03-23 DIAGNOSIS — I10 Essential (primary) hypertension: Secondary | ICD-10-CM | POA: Diagnosis not present

## 2019-03-23 DIAGNOSIS — E119 Type 2 diabetes mellitus without complications: Secondary | ICD-10-CM | POA: Diagnosis not present

## 2019-03-23 DIAGNOSIS — D0511 Intraductal carcinoma in situ of right breast: Secondary | ICD-10-CM | POA: Diagnosis not present

## 2019-03-23 DIAGNOSIS — Z87891 Personal history of nicotine dependence: Secondary | ICD-10-CM | POA: Diagnosis not present

## 2019-03-23 DIAGNOSIS — E785 Hyperlipidemia, unspecified: Secondary | ICD-10-CM | POA: Diagnosis not present

## 2019-03-23 DIAGNOSIS — C50811 Malignant neoplasm of overlapping sites of right female breast: Secondary | ICD-10-CM | POA: Diagnosis not present

## 2019-03-24 DIAGNOSIS — E7800 Pure hypercholesterolemia, unspecified: Secondary | ICD-10-CM | POA: Diagnosis not present

## 2019-03-24 DIAGNOSIS — Z87891 Personal history of nicotine dependence: Secondary | ICD-10-CM | POA: Diagnosis not present

## 2019-03-24 DIAGNOSIS — I10 Essential (primary) hypertension: Secondary | ICD-10-CM | POA: Diagnosis not present

## 2019-03-24 DIAGNOSIS — C50811 Malignant neoplasm of overlapping sites of right female breast: Secondary | ICD-10-CM | POA: Diagnosis not present

## 2019-03-24 DIAGNOSIS — Z170 Estrogen receptor positive status [ER+]: Secondary | ICD-10-CM | POA: Diagnosis not present

## 2019-03-24 DIAGNOSIS — K219 Gastro-esophageal reflux disease without esophagitis: Secondary | ICD-10-CM | POA: Diagnosis not present

## 2019-03-24 DIAGNOSIS — E119 Type 2 diabetes mellitus without complications: Secondary | ICD-10-CM | POA: Diagnosis not present

## 2019-03-24 DIAGNOSIS — Q600 Renal agenesis, unilateral: Secondary | ICD-10-CM | POA: Diagnosis not present

## 2019-03-24 DIAGNOSIS — E785 Hyperlipidemia, unspecified: Secondary | ICD-10-CM | POA: Diagnosis not present

## 2019-03-31 DIAGNOSIS — C50911 Malignant neoplasm of unspecified site of right female breast: Secondary | ICD-10-CM | POA: Diagnosis not present

## 2019-04-03 DIAGNOSIS — C50211 Malignant neoplasm of upper-inner quadrant of right female breast: Secondary | ICD-10-CM | POA: Diagnosis not present

## 2019-04-03 DIAGNOSIS — I10 Essential (primary) hypertension: Secondary | ICD-10-CM | POA: Diagnosis not present

## 2019-04-03 DIAGNOSIS — C50911 Malignant neoplasm of unspecified site of right female breast: Secondary | ICD-10-CM | POA: Diagnosis not present

## 2019-04-03 DIAGNOSIS — M8589 Other specified disorders of bone density and structure, multiple sites: Secondary | ICD-10-CM | POA: Diagnosis not present

## 2019-04-07 DIAGNOSIS — C50911 Malignant neoplasm of unspecified site of right female breast: Secondary | ICD-10-CM | POA: Diagnosis not present

## 2019-04-09 DIAGNOSIS — E1122 Type 2 diabetes mellitus with diabetic chronic kidney disease: Secondary | ICD-10-CM | POA: Diagnosis not present

## 2019-04-09 DIAGNOSIS — Z23 Encounter for immunization: Secondary | ICD-10-CM | POA: Diagnosis not present

## 2019-04-09 DIAGNOSIS — E785 Hyperlipidemia, unspecified: Secondary | ICD-10-CM | POA: Diagnosis not present

## 2019-04-16 DIAGNOSIS — C50919 Malignant neoplasm of unspecified site of unspecified female breast: Secondary | ICD-10-CM | POA: Diagnosis not present

## 2019-04-16 DIAGNOSIS — N183 Chronic kidney disease, stage 3 unspecified: Secondary | ICD-10-CM | POA: Diagnosis not present

## 2019-04-16 DIAGNOSIS — I129 Hypertensive chronic kidney disease with stage 1 through stage 4 chronic kidney disease, or unspecified chronic kidney disease: Secondary | ICD-10-CM | POA: Diagnosis not present

## 2019-04-16 DIAGNOSIS — E1121 Type 2 diabetes mellitus with diabetic nephropathy: Secondary | ICD-10-CM | POA: Diagnosis not present

## 2019-04-16 DIAGNOSIS — E1122 Type 2 diabetes mellitus with diabetic chronic kidney disease: Secondary | ICD-10-CM | POA: Diagnosis not present

## 2019-04-16 DIAGNOSIS — I25119 Atherosclerotic heart disease of native coronary artery with unspecified angina pectoris: Secondary | ICD-10-CM | POA: Diagnosis not present

## 2019-04-16 DIAGNOSIS — N1832 Chronic kidney disease, stage 3b: Secondary | ICD-10-CM | POA: Diagnosis not present

## 2019-04-16 DIAGNOSIS — R5383 Other fatigue: Secondary | ICD-10-CM | POA: Diagnosis not present

## 2019-04-21 DIAGNOSIS — C50911 Malignant neoplasm of unspecified site of right female breast: Secondary | ICD-10-CM | POA: Diagnosis not present

## 2019-04-28 DIAGNOSIS — C50911 Malignant neoplasm of unspecified site of right female breast: Secondary | ICD-10-CM | POA: Diagnosis not present

## 2019-05-06 DIAGNOSIS — E538 Deficiency of other specified B group vitamins: Secondary | ICD-10-CM | POA: Diagnosis not present

## 2019-05-08 DIAGNOSIS — E1122 Type 2 diabetes mellitus with diabetic chronic kidney disease: Secondary | ICD-10-CM | POA: Diagnosis not present

## 2019-05-08 DIAGNOSIS — E1121 Type 2 diabetes mellitus with diabetic nephropathy: Secondary | ICD-10-CM | POA: Diagnosis not present

## 2019-05-08 DIAGNOSIS — E785 Hyperlipidemia, unspecified: Secondary | ICD-10-CM | POA: Diagnosis not present

## 2019-05-13 DIAGNOSIS — E538 Deficiency of other specified B group vitamins: Secondary | ICD-10-CM | POA: Diagnosis not present

## 2019-05-20 DIAGNOSIS — E538 Deficiency of other specified B group vitamins: Secondary | ICD-10-CM | POA: Diagnosis not present

## 2019-05-22 DIAGNOSIS — C50911 Malignant neoplasm of unspecified site of right female breast: Secondary | ICD-10-CM | POA: Diagnosis not present

## 2019-05-27 DIAGNOSIS — C50911 Malignant neoplasm of unspecified site of right female breast: Secondary | ICD-10-CM | POA: Diagnosis not present

## 2019-06-02 DIAGNOSIS — D0511 Intraductal carcinoma in situ of right breast: Secondary | ICD-10-CM | POA: Diagnosis not present

## 2019-06-08 DIAGNOSIS — E785 Hyperlipidemia, unspecified: Secondary | ICD-10-CM | POA: Diagnosis not present

## 2019-06-08 DIAGNOSIS — E1121 Type 2 diabetes mellitus with diabetic nephropathy: Secondary | ICD-10-CM | POA: Diagnosis not present

## 2019-06-08 DIAGNOSIS — E1122 Type 2 diabetes mellitus with diabetic chronic kidney disease: Secondary | ICD-10-CM | POA: Diagnosis not present

## 2019-06-16 DIAGNOSIS — R5383 Other fatigue: Secondary | ICD-10-CM | POA: Diagnosis not present

## 2019-07-08 DIAGNOSIS — E1122 Type 2 diabetes mellitus with diabetic chronic kidney disease: Secondary | ICD-10-CM | POA: Diagnosis not present

## 2019-07-08 DIAGNOSIS — E1121 Type 2 diabetes mellitus with diabetic nephropathy: Secondary | ICD-10-CM | POA: Diagnosis not present

## 2019-07-08 DIAGNOSIS — E785 Hyperlipidemia, unspecified: Secondary | ICD-10-CM | POA: Diagnosis not present

## 2019-07-16 DIAGNOSIS — C50919 Malignant neoplasm of unspecified site of unspecified female breast: Secondary | ICD-10-CM | POA: Diagnosis not present

## 2019-07-16 DIAGNOSIS — C50211 Malignant neoplasm of upper-inner quadrant of right female breast: Secondary | ICD-10-CM | POA: Diagnosis not present

## 2019-07-16 DIAGNOSIS — D0511 Intraductal carcinoma in situ of right breast: Secondary | ICD-10-CM | POA: Diagnosis not present

## 2019-07-16 DIAGNOSIS — I700 Atherosclerosis of aorta: Secondary | ICD-10-CM | POA: Diagnosis not present

## 2019-07-16 DIAGNOSIS — I160 Hypertensive urgency: Secondary | ICD-10-CM | POA: Diagnosis not present

## 2019-07-16 DIAGNOSIS — I10 Essential (primary) hypertension: Secondary | ICD-10-CM | POA: Diagnosis not present

## 2019-07-16 DIAGNOSIS — R930 Abnormal findings on diagnostic imaging of skull and head, not elsewhere classified: Secondary | ICD-10-CM | POA: Diagnosis not present

## 2019-07-16 DIAGNOSIS — R8271 Bacteriuria: Secondary | ICD-10-CM | POA: Diagnosis not present

## 2019-07-16 DIAGNOSIS — R079 Chest pain, unspecified: Secondary | ICD-10-CM | POA: Diagnosis not present

## 2019-07-16 DIAGNOSIS — C50911 Malignant neoplasm of unspecified site of right female breast: Secondary | ICD-10-CM | POA: Diagnosis not present

## 2019-07-30 DIAGNOSIS — Z136 Encounter for screening for cardiovascular disorders: Secondary | ICD-10-CM | POA: Diagnosis not present

## 2019-07-30 DIAGNOSIS — E1122 Type 2 diabetes mellitus with diabetic chronic kidney disease: Secondary | ICD-10-CM | POA: Diagnosis not present

## 2019-07-30 DIAGNOSIS — Z7189 Other specified counseling: Secondary | ICD-10-CM | POA: Diagnosis not present

## 2019-07-30 DIAGNOSIS — Z0000 Encounter for general adult medical examination without abnormal findings: Secondary | ICD-10-CM | POA: Diagnosis not present

## 2019-07-30 DIAGNOSIS — Z139 Encounter for screening, unspecified: Secondary | ICD-10-CM | POA: Diagnosis not present

## 2019-07-30 DIAGNOSIS — Z1339 Encounter for screening examination for other mental health and behavioral disorders: Secondary | ICD-10-CM | POA: Diagnosis not present

## 2019-07-30 DIAGNOSIS — E538 Deficiency of other specified B group vitamins: Secondary | ICD-10-CM | POA: Diagnosis not present

## 2019-07-30 DIAGNOSIS — E785 Hyperlipidemia, unspecified: Secondary | ICD-10-CM | POA: Diagnosis not present

## 2019-07-30 DIAGNOSIS — Z1331 Encounter for screening for depression: Secondary | ICD-10-CM | POA: Diagnosis not present

## 2019-08-06 DIAGNOSIS — N183 Chronic kidney disease, stage 3 unspecified: Secondary | ICD-10-CM | POA: Diagnosis not present

## 2019-08-06 DIAGNOSIS — I129 Hypertensive chronic kidney disease with stage 1 through stage 4 chronic kidney disease, or unspecified chronic kidney disease: Secondary | ICD-10-CM | POA: Diagnosis not present

## 2019-08-06 DIAGNOSIS — E1122 Type 2 diabetes mellitus with diabetic chronic kidney disease: Secondary | ICD-10-CM | POA: Diagnosis not present

## 2019-08-06 DIAGNOSIS — E1121 Type 2 diabetes mellitus with diabetic nephropathy: Secondary | ICD-10-CM | POA: Diagnosis not present

## 2019-08-07 DIAGNOSIS — I129 Hypertensive chronic kidney disease with stage 1 through stage 4 chronic kidney disease, or unspecified chronic kidney disease: Secondary | ICD-10-CM | POA: Diagnosis not present

## 2019-08-07 DIAGNOSIS — E1122 Type 2 diabetes mellitus with diabetic chronic kidney disease: Secondary | ICD-10-CM | POA: Diagnosis not present

## 2019-08-07 DIAGNOSIS — E1121 Type 2 diabetes mellitus with diabetic nephropathy: Secondary | ICD-10-CM | POA: Diagnosis not present

## 2019-08-07 DIAGNOSIS — N183 Chronic kidney disease, stage 3 unspecified: Secondary | ICD-10-CM | POA: Diagnosis not present

## 2019-08-17 DIAGNOSIS — E1122 Type 2 diabetes mellitus with diabetic chronic kidney disease: Secondary | ICD-10-CM | POA: Diagnosis not present

## 2019-08-17 DIAGNOSIS — I129 Hypertensive chronic kidney disease with stage 1 through stage 4 chronic kidney disease, or unspecified chronic kidney disease: Secondary | ICD-10-CM | POA: Diagnosis not present

## 2019-08-17 DIAGNOSIS — N183 Chronic kidney disease, stage 3 unspecified: Secondary | ICD-10-CM | POA: Diagnosis not present

## 2019-08-18 DIAGNOSIS — E1122 Type 2 diabetes mellitus with diabetic chronic kidney disease: Secondary | ICD-10-CM | POA: Diagnosis not present

## 2019-08-18 DIAGNOSIS — I129 Hypertensive chronic kidney disease with stage 1 through stage 4 chronic kidney disease, or unspecified chronic kidney disease: Secondary | ICD-10-CM | POA: Diagnosis not present

## 2019-08-18 DIAGNOSIS — I160 Hypertensive urgency: Secondary | ICD-10-CM | POA: Diagnosis not present

## 2019-08-18 DIAGNOSIS — N183 Chronic kidney disease, stage 3 unspecified: Secondary | ICD-10-CM | POA: Diagnosis not present

## 2019-08-31 DIAGNOSIS — C50919 Malignant neoplasm of unspecified site of unspecified female breast: Secondary | ICD-10-CM | POA: Diagnosis not present

## 2019-08-31 DIAGNOSIS — N1832 Chronic kidney disease, stage 3b: Secondary | ICD-10-CM | POA: Diagnosis not present

## 2019-08-31 DIAGNOSIS — E538 Deficiency of other specified B group vitamins: Secondary | ICD-10-CM | POA: Diagnosis not present

## 2019-08-31 DIAGNOSIS — I25119 Atherosclerotic heart disease of native coronary artery with unspecified angina pectoris: Secondary | ICD-10-CM | POA: Diagnosis not present

## 2019-08-31 DIAGNOSIS — I10 Essential (primary) hypertension: Secondary | ICD-10-CM | POA: Diagnosis not present

## 2019-09-05 DIAGNOSIS — I10 Essential (primary) hypertension: Secondary | ICD-10-CM | POA: Diagnosis not present

## 2019-09-06 DIAGNOSIS — C50919 Malignant neoplasm of unspecified site of unspecified female breast: Secondary | ICD-10-CM | POA: Diagnosis not present

## 2019-09-06 DIAGNOSIS — I10 Essential (primary) hypertension: Secondary | ICD-10-CM | POA: Diagnosis not present

## 2019-09-06 DIAGNOSIS — I25119 Atherosclerotic heart disease of native coronary artery with unspecified angina pectoris: Secondary | ICD-10-CM | POA: Diagnosis not present

## 2019-09-06 DIAGNOSIS — N1832 Chronic kidney disease, stage 3b: Secondary | ICD-10-CM | POA: Diagnosis not present

## 2019-09-15 DIAGNOSIS — I129 Hypertensive chronic kidney disease with stage 1 through stage 4 chronic kidney disease, or unspecified chronic kidney disease: Secondary | ICD-10-CM | POA: Diagnosis not present

## 2019-09-15 DIAGNOSIS — I700 Atherosclerosis of aorta: Secondary | ICD-10-CM | POA: Diagnosis not present

## 2019-09-15 DIAGNOSIS — J449 Chronic obstructive pulmonary disease, unspecified: Secondary | ICD-10-CM | POA: Diagnosis not present

## 2019-09-15 DIAGNOSIS — E1122 Type 2 diabetes mellitus with diabetic chronic kidney disease: Secondary | ICD-10-CM | POA: Diagnosis not present

## 2019-09-15 DIAGNOSIS — N183 Chronic kidney disease, stage 3 unspecified: Secondary | ICD-10-CM | POA: Diagnosis not present

## 2019-09-15 DIAGNOSIS — R6883 Chills (without fever): Secondary | ICD-10-CM | POA: Diagnosis not present

## 2019-09-15 DIAGNOSIS — R0602 Shortness of breath: Secondary | ICD-10-CM | POA: Diagnosis not present

## 2019-09-25 DIAGNOSIS — N261 Atrophy of kidney (terminal): Secondary | ICD-10-CM | POA: Diagnosis not present

## 2019-09-25 DIAGNOSIS — E1122 Type 2 diabetes mellitus with diabetic chronic kidney disease: Secondary | ICD-10-CM | POA: Diagnosis not present

## 2019-09-25 DIAGNOSIS — I129 Hypertensive chronic kidney disease with stage 1 through stage 4 chronic kidney disease, or unspecified chronic kidney disease: Secondary | ICD-10-CM | POA: Diagnosis not present

## 2019-09-25 DIAGNOSIS — N1832 Chronic kidney disease, stage 3b: Secondary | ICD-10-CM | POA: Diagnosis not present

## 2019-09-25 DIAGNOSIS — R809 Proteinuria, unspecified: Secondary | ICD-10-CM | POA: Diagnosis not present

## 2019-09-28 DIAGNOSIS — Z6828 Body mass index (BMI) 28.0-28.9, adult: Secondary | ICD-10-CM | POA: Diagnosis not present

## 2019-09-28 DIAGNOSIS — E538 Deficiency of other specified B group vitamins: Secondary | ICD-10-CM | POA: Diagnosis not present

## 2019-09-28 DIAGNOSIS — I10 Essential (primary) hypertension: Secondary | ICD-10-CM | POA: Diagnosis not present

## 2019-09-28 DIAGNOSIS — N1832 Chronic kidney disease, stage 3b: Secondary | ICD-10-CM | POA: Diagnosis not present

## 2019-10-02 DIAGNOSIS — N1832 Chronic kidney disease, stage 3b: Secondary | ICD-10-CM | POA: Diagnosis not present

## 2019-10-06 DIAGNOSIS — I10 Essential (primary) hypertension: Secondary | ICD-10-CM | POA: Diagnosis not present

## 2019-10-07 DIAGNOSIS — I10 Essential (primary) hypertension: Secondary | ICD-10-CM | POA: Diagnosis not present

## 2019-10-07 DIAGNOSIS — E538 Deficiency of other specified B group vitamins: Secondary | ICD-10-CM | POA: Diagnosis not present

## 2019-10-07 DIAGNOSIS — I129 Hypertensive chronic kidney disease with stage 1 through stage 4 chronic kidney disease, or unspecified chronic kidney disease: Secondary | ICD-10-CM | POA: Diagnosis not present

## 2019-10-07 DIAGNOSIS — N1832 Chronic kidney disease, stage 3b: Secondary | ICD-10-CM | POA: Diagnosis not present

## 2019-10-15 DIAGNOSIS — I10 Essential (primary) hypertension: Secondary | ICD-10-CM | POA: Diagnosis not present

## 2019-10-15 DIAGNOSIS — D241 Benign neoplasm of right breast: Secondary | ICD-10-CM | POA: Diagnosis not present

## 2019-10-15 DIAGNOSIS — M8589 Other specified disorders of bone density and structure, multiple sites: Secondary | ICD-10-CM | POA: Diagnosis not present

## 2019-10-15 DIAGNOSIS — Z853 Personal history of malignant neoplasm of breast: Secondary | ICD-10-CM | POA: Diagnosis not present

## 2019-10-15 DIAGNOSIS — Z79811 Long term (current) use of aromatase inhibitors: Secondary | ICD-10-CM | POA: Diagnosis not present

## 2019-10-26 DIAGNOSIS — E1122 Type 2 diabetes mellitus with diabetic chronic kidney disease: Secondary | ICD-10-CM | POA: Diagnosis not present

## 2019-10-26 DIAGNOSIS — E785 Hyperlipidemia, unspecified: Secondary | ICD-10-CM | POA: Diagnosis not present

## 2019-11-02 DIAGNOSIS — Z6828 Body mass index (BMI) 28.0-28.9, adult: Secondary | ICD-10-CM | POA: Diagnosis not present

## 2019-11-02 DIAGNOSIS — E1121 Type 2 diabetes mellitus with diabetic nephropathy: Secondary | ICD-10-CM | POA: Diagnosis not present

## 2019-11-02 DIAGNOSIS — N184 Chronic kidney disease, stage 4 (severe): Secondary | ICD-10-CM | POA: Diagnosis not present

## 2019-11-02 DIAGNOSIS — J449 Chronic obstructive pulmonary disease, unspecified: Secondary | ICD-10-CM | POA: Diagnosis not present

## 2019-11-05 DIAGNOSIS — I10 Essential (primary) hypertension: Secondary | ICD-10-CM | POA: Diagnosis not present

## 2019-11-06 DIAGNOSIS — N184 Chronic kidney disease, stage 4 (severe): Secondary | ICD-10-CM | POA: Diagnosis not present

## 2019-11-06 DIAGNOSIS — I10 Essential (primary) hypertension: Secondary | ICD-10-CM | POA: Diagnosis not present

## 2019-11-06 DIAGNOSIS — J449 Chronic obstructive pulmonary disease, unspecified: Secondary | ICD-10-CM | POA: Diagnosis not present

## 2019-11-06 DIAGNOSIS — E1121 Type 2 diabetes mellitus with diabetic nephropathy: Secondary | ICD-10-CM | POA: Diagnosis not present

## 2019-11-30 DIAGNOSIS — N183 Chronic kidney disease, stage 3 unspecified: Secondary | ICD-10-CM | POA: Diagnosis not present

## 2019-11-30 DIAGNOSIS — E1121 Type 2 diabetes mellitus with diabetic nephropathy: Secondary | ICD-10-CM | POA: Diagnosis not present

## 2019-11-30 DIAGNOSIS — E1122 Type 2 diabetes mellitus with diabetic chronic kidney disease: Secondary | ICD-10-CM | POA: Diagnosis not present

## 2019-11-30 DIAGNOSIS — I129 Hypertensive chronic kidney disease with stage 1 through stage 4 chronic kidney disease, or unspecified chronic kidney disease: Secondary | ICD-10-CM | POA: Diagnosis not present

## 2019-12-03 DIAGNOSIS — H47392 Other disorders of optic disc, left eye: Secondary | ICD-10-CM | POA: Diagnosis not present

## 2019-12-03 DIAGNOSIS — H35033 Hypertensive retinopathy, bilateral: Secondary | ICD-10-CM | POA: Diagnosis not present

## 2019-12-03 DIAGNOSIS — H04123 Dry eye syndrome of bilateral lacrimal glands: Secondary | ICD-10-CM | POA: Diagnosis not present

## 2019-12-03 DIAGNOSIS — H25813 Combined forms of age-related cataract, bilateral: Secondary | ICD-10-CM | POA: Diagnosis not present

## 2019-12-07 DIAGNOSIS — I129 Hypertensive chronic kidney disease with stage 1 through stage 4 chronic kidney disease, or unspecified chronic kidney disease: Secondary | ICD-10-CM | POA: Diagnosis not present

## 2019-12-07 DIAGNOSIS — E1121 Type 2 diabetes mellitus with diabetic nephropathy: Secondary | ICD-10-CM | POA: Diagnosis not present

## 2019-12-07 DIAGNOSIS — E1122 Type 2 diabetes mellitus with diabetic chronic kidney disease: Secondary | ICD-10-CM | POA: Diagnosis not present

## 2019-12-07 DIAGNOSIS — N183 Chronic kidney disease, stage 3 unspecified: Secondary | ICD-10-CM | POA: Diagnosis not present

## 2019-12-22 DIAGNOSIS — N1832 Chronic kidney disease, stage 3b: Secondary | ICD-10-CM | POA: Diagnosis not present

## 2019-12-31 DIAGNOSIS — E1122 Type 2 diabetes mellitus with diabetic chronic kidney disease: Secondary | ICD-10-CM | POA: Diagnosis not present

## 2019-12-31 DIAGNOSIS — R809 Proteinuria, unspecified: Secondary | ICD-10-CM | POA: Diagnosis not present

## 2019-12-31 DIAGNOSIS — I129 Hypertensive chronic kidney disease with stage 1 through stage 4 chronic kidney disease, or unspecified chronic kidney disease: Secondary | ICD-10-CM | POA: Diagnosis not present

## 2019-12-31 DIAGNOSIS — N184 Chronic kidney disease, stage 4 (severe): Secondary | ICD-10-CM | POA: Diagnosis not present

## 2019-12-31 DIAGNOSIS — N261 Atrophy of kidney (terminal): Secondary | ICD-10-CM | POA: Diagnosis not present

## 2020-01-04 DIAGNOSIS — R928 Other abnormal and inconclusive findings on diagnostic imaging of breast: Secondary | ICD-10-CM | POA: Diagnosis not present

## 2020-01-04 DIAGNOSIS — Z853 Personal history of malignant neoplasm of breast: Secondary | ICD-10-CM | POA: Diagnosis not present

## 2020-01-04 DIAGNOSIS — N6012 Diffuse cystic mastopathy of left breast: Secondary | ICD-10-CM | POA: Diagnosis not present

## 2020-01-04 DIAGNOSIS — C50919 Malignant neoplasm of unspecified site of unspecified female breast: Secondary | ICD-10-CM | POA: Diagnosis not present

## 2020-01-04 DIAGNOSIS — N6321 Unspecified lump in the left breast, upper outer quadrant: Secondary | ICD-10-CM | POA: Diagnosis not present

## 2020-01-06 DIAGNOSIS — N261 Atrophy of kidney (terminal): Secondary | ICD-10-CM | POA: Diagnosis not present

## 2020-01-06 DIAGNOSIS — E1121 Type 2 diabetes mellitus with diabetic nephropathy: Secondary | ICD-10-CM | POA: Diagnosis not present

## 2020-01-06 DIAGNOSIS — N1330 Unspecified hydronephrosis: Secondary | ICD-10-CM | POA: Diagnosis not present

## 2020-01-06 DIAGNOSIS — N184 Chronic kidney disease, stage 4 (severe): Secondary | ICD-10-CM | POA: Diagnosis not present

## 2020-01-06 DIAGNOSIS — N281 Cyst of kidney, acquired: Secondary | ICD-10-CM | POA: Diagnosis not present

## 2020-01-06 DIAGNOSIS — I129 Hypertensive chronic kidney disease with stage 1 through stage 4 chronic kidney disease, or unspecified chronic kidney disease: Secondary | ICD-10-CM | POA: Diagnosis not present

## 2020-01-06 DIAGNOSIS — N189 Chronic kidney disease, unspecified: Secondary | ICD-10-CM | POA: Diagnosis not present

## 2020-01-07 DIAGNOSIS — D0511 Intraductal carcinoma in situ of right breast: Secondary | ICD-10-CM | POA: Diagnosis not present

## 2020-01-07 DIAGNOSIS — Z1231 Encounter for screening mammogram for malignant neoplasm of breast: Secondary | ICD-10-CM | POA: Diagnosis not present

## 2020-01-14 DIAGNOSIS — M6289 Other specified disorders of muscle: Secondary | ICD-10-CM | POA: Diagnosis not present

## 2020-01-14 DIAGNOSIS — C50211 Malignant neoplasm of upper-inner quadrant of right female breast: Secondary | ICD-10-CM | POA: Diagnosis not present

## 2020-01-14 DIAGNOSIS — M8589 Other specified disorders of bone density and structure, multiple sites: Secondary | ICD-10-CM | POA: Diagnosis not present

## 2020-01-14 DIAGNOSIS — Z853 Personal history of malignant neoplasm of breast: Secondary | ICD-10-CM | POA: Diagnosis not present

## 2020-01-14 DIAGNOSIS — I10 Essential (primary) hypertension: Secondary | ICD-10-CM | POA: Diagnosis not present

## 2020-01-14 DIAGNOSIS — Z79811 Long term (current) use of aromatase inhibitors: Secondary | ICD-10-CM | POA: Diagnosis not present

## 2020-01-25 DIAGNOSIS — E785 Hyperlipidemia, unspecified: Secondary | ICD-10-CM | POA: Diagnosis not present

## 2020-01-25 DIAGNOSIS — E1122 Type 2 diabetes mellitus with diabetic chronic kidney disease: Secondary | ICD-10-CM | POA: Diagnosis not present

## 2020-01-25 DIAGNOSIS — N184 Chronic kidney disease, stage 4 (severe): Secondary | ICD-10-CM | POA: Diagnosis not present

## 2020-02-01 DIAGNOSIS — J449 Chronic obstructive pulmonary disease, unspecified: Secondary | ICD-10-CM | POA: Diagnosis not present

## 2020-02-01 DIAGNOSIS — C44320 Squamous cell carcinoma of skin of unspecified parts of face: Secondary | ICD-10-CM | POA: Diagnosis not present

## 2020-02-01 DIAGNOSIS — E1121 Type 2 diabetes mellitus with diabetic nephropathy: Secondary | ICD-10-CM | POA: Diagnosis not present

## 2020-02-01 DIAGNOSIS — N1832 Chronic kidney disease, stage 3b: Secondary | ICD-10-CM | POA: Diagnosis not present

## 2020-02-07 DIAGNOSIS — J449 Chronic obstructive pulmonary disease, unspecified: Secondary | ICD-10-CM | POA: Diagnosis not present

## 2020-02-07 DIAGNOSIS — C44320 Squamous cell carcinoma of skin of unspecified parts of face: Secondary | ICD-10-CM | POA: Diagnosis not present

## 2020-02-07 DIAGNOSIS — E1121 Type 2 diabetes mellitus with diabetic nephropathy: Secondary | ICD-10-CM | POA: Diagnosis not present

## 2020-02-07 DIAGNOSIS — N1832 Chronic kidney disease, stage 3b: Secondary | ICD-10-CM | POA: Diagnosis not present

## 2020-02-17 DIAGNOSIS — C4401 Basal cell carcinoma of skin of lip: Secondary | ICD-10-CM | POA: Diagnosis not present

## 2020-02-17 DIAGNOSIS — L821 Other seborrheic keratosis: Secondary | ICD-10-CM | POA: Diagnosis not present

## 2020-02-17 DIAGNOSIS — L814 Other melanin hyperpigmentation: Secondary | ICD-10-CM | POA: Diagnosis not present

## 2020-02-17 DIAGNOSIS — D1801 Hemangioma of skin and subcutaneous tissue: Secondary | ICD-10-CM | POA: Diagnosis not present

## 2020-02-17 DIAGNOSIS — L578 Other skin changes due to chronic exposure to nonionizing radiation: Secondary | ICD-10-CM | POA: Diagnosis not present

## 2020-02-24 DIAGNOSIS — C4401 Basal cell carcinoma of skin of lip: Secondary | ICD-10-CM | POA: Diagnosis not present

## 2020-02-26 DIAGNOSIS — H25811 Combined forms of age-related cataract, right eye: Secondary | ICD-10-CM | POA: Diagnosis not present

## 2020-02-26 DIAGNOSIS — E113293 Type 2 diabetes mellitus with mild nonproliferative diabetic retinopathy without macular edema, bilateral: Secondary | ICD-10-CM | POA: Diagnosis not present

## 2020-03-08 DIAGNOSIS — J449 Chronic obstructive pulmonary disease, unspecified: Secondary | ICD-10-CM | POA: Diagnosis not present

## 2020-03-08 DIAGNOSIS — E1121 Type 2 diabetes mellitus with diabetic nephropathy: Secondary | ICD-10-CM | POA: Diagnosis not present

## 2020-03-08 DIAGNOSIS — C44320 Squamous cell carcinoma of skin of unspecified parts of face: Secondary | ICD-10-CM | POA: Diagnosis not present

## 2020-03-08 DIAGNOSIS — N1832 Chronic kidney disease, stage 3b: Secondary | ICD-10-CM | POA: Diagnosis not present

## 2020-03-09 DIAGNOSIS — C44320 Squamous cell carcinoma of skin of unspecified parts of face: Secondary | ICD-10-CM | POA: Diagnosis not present

## 2020-03-09 DIAGNOSIS — J449 Chronic obstructive pulmonary disease, unspecified: Secondary | ICD-10-CM | POA: Diagnosis not present

## 2020-03-09 DIAGNOSIS — E1121 Type 2 diabetes mellitus with diabetic nephropathy: Secondary | ICD-10-CM | POA: Diagnosis not present

## 2020-03-09 DIAGNOSIS — N1832 Chronic kidney disease, stage 3b: Secondary | ICD-10-CM | POA: Diagnosis not present

## 2020-03-22 DIAGNOSIS — C4401 Basal cell carcinoma of skin of lip: Secondary | ICD-10-CM | POA: Diagnosis not present

## 2020-03-22 DIAGNOSIS — N184 Chronic kidney disease, stage 4 (severe): Secondary | ICD-10-CM | POA: Diagnosis not present

## 2020-03-28 DIAGNOSIS — R809 Proteinuria, unspecified: Secondary | ICD-10-CM | POA: Diagnosis not present

## 2020-03-28 DIAGNOSIS — I129 Hypertensive chronic kidney disease with stage 1 through stage 4 chronic kidney disease, or unspecified chronic kidney disease: Secondary | ICD-10-CM | POA: Diagnosis not present

## 2020-03-28 DIAGNOSIS — E1122 Type 2 diabetes mellitus with diabetic chronic kidney disease: Secondary | ICD-10-CM | POA: Diagnosis not present

## 2020-03-28 DIAGNOSIS — D631 Anemia in chronic kidney disease: Secondary | ICD-10-CM | POA: Diagnosis not present

## 2020-03-28 DIAGNOSIS — N261 Atrophy of kidney (terminal): Secondary | ICD-10-CM | POA: Diagnosis not present

## 2020-03-28 DIAGNOSIS — N1832 Chronic kidney disease, stage 3b: Secondary | ICD-10-CM | POA: Diagnosis not present

## 2020-03-29 DIAGNOSIS — E119 Type 2 diabetes mellitus without complications: Secondary | ICD-10-CM | POA: Diagnosis not present

## 2020-03-29 DIAGNOSIS — H25811 Combined forms of age-related cataract, right eye: Secondary | ICD-10-CM | POA: Diagnosis not present

## 2020-03-29 DIAGNOSIS — Z01818 Encounter for other preprocedural examination: Secondary | ICD-10-CM | POA: Diagnosis not present

## 2020-04-05 DIAGNOSIS — H25811 Combined forms of age-related cataract, right eye: Secondary | ICD-10-CM | POA: Diagnosis not present

## 2020-04-05 DIAGNOSIS — E669 Obesity, unspecified: Secondary | ICD-10-CM | POA: Diagnosis not present

## 2020-04-05 DIAGNOSIS — H35033 Hypertensive retinopathy, bilateral: Secondary | ICD-10-CM | POA: Diagnosis not present

## 2020-04-05 DIAGNOSIS — E113293 Type 2 diabetes mellitus with mild nonproliferative diabetic retinopathy without macular edema, bilateral: Secondary | ICD-10-CM | POA: Diagnosis not present

## 2020-04-05 DIAGNOSIS — H259 Unspecified age-related cataract: Secondary | ICD-10-CM | POA: Diagnosis not present

## 2020-04-05 DIAGNOSIS — H52223 Regular astigmatism, bilateral: Secondary | ICD-10-CM | POA: Diagnosis not present

## 2020-04-05 DIAGNOSIS — I129 Hypertensive chronic kidney disease with stage 1 through stage 4 chronic kidney disease, or unspecified chronic kidney disease: Secondary | ICD-10-CM | POA: Diagnosis not present

## 2020-04-05 DIAGNOSIS — E1136 Type 2 diabetes mellitus with diabetic cataract: Secondary | ICD-10-CM | POA: Diagnosis not present

## 2020-04-05 DIAGNOSIS — K219 Gastro-esophageal reflux disease without esophagitis: Secondary | ICD-10-CM | POA: Diagnosis not present

## 2020-04-05 DIAGNOSIS — Z6830 Body mass index (BMI) 30.0-30.9, adult: Secondary | ICD-10-CM | POA: Diagnosis not present

## 2020-04-08 DIAGNOSIS — C44320 Squamous cell carcinoma of skin of unspecified parts of face: Secondary | ICD-10-CM | POA: Diagnosis not present

## 2020-04-08 DIAGNOSIS — N1832 Chronic kidney disease, stage 3b: Secondary | ICD-10-CM | POA: Diagnosis not present

## 2020-04-08 DIAGNOSIS — J449 Chronic obstructive pulmonary disease, unspecified: Secondary | ICD-10-CM | POA: Diagnosis not present

## 2020-04-08 DIAGNOSIS — E1121 Type 2 diabetes mellitus with diabetic nephropathy: Secondary | ICD-10-CM | POA: Diagnosis not present

## 2020-04-12 DIAGNOSIS — E119 Type 2 diabetes mellitus without complications: Secondary | ICD-10-CM | POA: Diagnosis not present

## 2020-04-13 DIAGNOSIS — Z23 Encounter for immunization: Secondary | ICD-10-CM | POA: Diagnosis not present

## 2020-04-18 DIAGNOSIS — C50211 Malignant neoplasm of upper-inner quadrant of right female breast: Secondary | ICD-10-CM | POA: Diagnosis not present

## 2020-04-18 DIAGNOSIS — M8589 Other specified disorders of bone density and structure, multiple sites: Secondary | ICD-10-CM | POA: Diagnosis not present

## 2020-04-29 DIAGNOSIS — E785 Hyperlipidemia, unspecified: Secondary | ICD-10-CM | POA: Diagnosis not present

## 2020-04-29 DIAGNOSIS — E1122 Type 2 diabetes mellitus with diabetic chronic kidney disease: Secondary | ICD-10-CM | POA: Diagnosis not present

## 2020-05-03 DIAGNOSIS — H259 Unspecified age-related cataract: Secondary | ICD-10-CM | POA: Diagnosis not present

## 2020-05-03 DIAGNOSIS — E785 Hyperlipidemia, unspecified: Secondary | ICD-10-CM | POA: Diagnosis not present

## 2020-05-03 DIAGNOSIS — H25812 Combined forms of age-related cataract, left eye: Secondary | ICD-10-CM | POA: Diagnosis not present

## 2020-05-03 DIAGNOSIS — N183 Chronic kidney disease, stage 3 unspecified: Secondary | ICD-10-CM | POA: Diagnosis not present

## 2020-05-03 DIAGNOSIS — I129 Hypertensive chronic kidney disease with stage 1 through stage 4 chronic kidney disease, or unspecified chronic kidney disease: Secondary | ICD-10-CM | POA: Diagnosis not present

## 2020-05-03 DIAGNOSIS — E1122 Type 2 diabetes mellitus with diabetic chronic kidney disease: Secondary | ICD-10-CM | POA: Diagnosis not present

## 2020-05-03 DIAGNOSIS — Z87891 Personal history of nicotine dependence: Secondary | ICD-10-CM | POA: Diagnosis not present

## 2020-05-03 DIAGNOSIS — E113293 Type 2 diabetes mellitus with mild nonproliferative diabetic retinopathy without macular edema, bilateral: Secondary | ICD-10-CM | POA: Diagnosis not present

## 2020-05-03 DIAGNOSIS — E1136 Type 2 diabetes mellitus with diabetic cataract: Secondary | ICD-10-CM | POA: Diagnosis not present

## 2020-05-03 DIAGNOSIS — K219 Gastro-esophageal reflux disease without esophagitis: Secondary | ICD-10-CM | POA: Diagnosis not present

## 2020-05-03 DIAGNOSIS — H52223 Regular astigmatism, bilateral: Secondary | ICD-10-CM | POA: Diagnosis not present

## 2020-05-06 DIAGNOSIS — N183 Chronic kidney disease, stage 3 unspecified: Secondary | ICD-10-CM | POA: Diagnosis not present

## 2020-05-06 DIAGNOSIS — E1121 Type 2 diabetes mellitus with diabetic nephropathy: Secondary | ICD-10-CM | POA: Diagnosis not present

## 2020-05-06 DIAGNOSIS — I129 Hypertensive chronic kidney disease with stage 1 through stage 4 chronic kidney disease, or unspecified chronic kidney disease: Secondary | ICD-10-CM | POA: Diagnosis not present

## 2020-05-06 DIAGNOSIS — E1122 Type 2 diabetes mellitus with diabetic chronic kidney disease: Secondary | ICD-10-CM | POA: Diagnosis not present

## 2020-05-09 DIAGNOSIS — E1121 Type 2 diabetes mellitus with diabetic nephropathy: Secondary | ICD-10-CM | POA: Diagnosis not present

## 2020-05-09 DIAGNOSIS — E1122 Type 2 diabetes mellitus with diabetic chronic kidney disease: Secondary | ICD-10-CM | POA: Diagnosis not present

## 2020-05-09 DIAGNOSIS — I129 Hypertensive chronic kidney disease with stage 1 through stage 4 chronic kidney disease, or unspecified chronic kidney disease: Secondary | ICD-10-CM | POA: Diagnosis not present

## 2020-05-09 DIAGNOSIS — N183 Chronic kidney disease, stage 3 unspecified: Secondary | ICD-10-CM | POA: Diagnosis not present

## 2020-06-08 DIAGNOSIS — N183 Chronic kidney disease, stage 3 unspecified: Secondary | ICD-10-CM | POA: Diagnosis not present

## 2020-06-08 DIAGNOSIS — E1122 Type 2 diabetes mellitus with diabetic chronic kidney disease: Secondary | ICD-10-CM | POA: Diagnosis not present

## 2020-06-08 DIAGNOSIS — I129 Hypertensive chronic kidney disease with stage 1 through stage 4 chronic kidney disease, or unspecified chronic kidney disease: Secondary | ICD-10-CM | POA: Diagnosis not present

## 2020-06-08 DIAGNOSIS — E1121 Type 2 diabetes mellitus with diabetic nephropathy: Secondary | ICD-10-CM | POA: Diagnosis not present

## 2020-06-21 DIAGNOSIS — C4401 Basal cell carcinoma of skin of lip: Secondary | ICD-10-CM | POA: Diagnosis not present

## 2020-07-09 DIAGNOSIS — I129 Hypertensive chronic kidney disease with stage 1 through stage 4 chronic kidney disease, or unspecified chronic kidney disease: Secondary | ICD-10-CM | POA: Diagnosis not present

## 2020-07-09 DIAGNOSIS — N183 Chronic kidney disease, stage 3 unspecified: Secondary | ICD-10-CM | POA: Diagnosis not present

## 2020-07-09 DIAGNOSIS — E1121 Type 2 diabetes mellitus with diabetic nephropathy: Secondary | ICD-10-CM | POA: Diagnosis not present

## 2020-07-09 DIAGNOSIS — E785 Hyperlipidemia, unspecified: Secondary | ICD-10-CM | POA: Diagnosis not present

## 2020-07-11 DIAGNOSIS — N1832 Chronic kidney disease, stage 3b: Secondary | ICD-10-CM | POA: Diagnosis not present

## 2020-07-11 DIAGNOSIS — D631 Anemia in chronic kidney disease: Secondary | ICD-10-CM | POA: Diagnosis not present

## 2020-07-11 DIAGNOSIS — D509 Iron deficiency anemia, unspecified: Secondary | ICD-10-CM | POA: Diagnosis not present

## 2020-07-14 DIAGNOSIS — Z961 Presence of intraocular lens: Secondary | ICD-10-CM | POA: Diagnosis not present

## 2020-07-22 DIAGNOSIS — N1832 Chronic kidney disease, stage 3b: Secondary | ICD-10-CM | POA: Diagnosis not present

## 2020-07-22 DIAGNOSIS — D631 Anemia in chronic kidney disease: Secondary | ICD-10-CM | POA: Diagnosis not present

## 2020-07-22 DIAGNOSIS — I129 Hypertensive chronic kidney disease with stage 1 through stage 4 chronic kidney disease, or unspecified chronic kidney disease: Secondary | ICD-10-CM | POA: Diagnosis not present

## 2020-07-22 DIAGNOSIS — N261 Atrophy of kidney (terminal): Secondary | ICD-10-CM | POA: Diagnosis not present

## 2020-07-22 DIAGNOSIS — E1122 Type 2 diabetes mellitus with diabetic chronic kidney disease: Secondary | ICD-10-CM | POA: Diagnosis not present

## 2020-07-22 DIAGNOSIS — R809 Proteinuria, unspecified: Secondary | ICD-10-CM | POA: Diagnosis not present

## 2020-08-09 DIAGNOSIS — E785 Hyperlipidemia, unspecified: Secondary | ICD-10-CM | POA: Diagnosis not present

## 2020-08-09 DIAGNOSIS — E1121 Type 2 diabetes mellitus with diabetic nephropathy: Secondary | ICD-10-CM | POA: Diagnosis not present

## 2020-08-09 DIAGNOSIS — N183 Chronic kidney disease, stage 3 unspecified: Secondary | ICD-10-CM | POA: Diagnosis not present

## 2020-08-09 DIAGNOSIS — I129 Hypertensive chronic kidney disease with stage 1 through stage 4 chronic kidney disease, or unspecified chronic kidney disease: Secondary | ICD-10-CM | POA: Diagnosis not present

## 2020-08-16 ENCOUNTER — Telehealth: Payer: Self-pay | Admitting: Oncology

## 2020-08-16 NOTE — Telephone Encounter (Signed)
Patient rescheduled her 2/11 Follow Up to 6/3 after her 6/1 Mammogram

## 2020-08-19 ENCOUNTER — Inpatient Hospital Stay: Payer: Medicare HMO | Admitting: Oncology

## 2020-09-06 DIAGNOSIS — I129 Hypertensive chronic kidney disease with stage 1 through stage 4 chronic kidney disease, or unspecified chronic kidney disease: Secondary | ICD-10-CM | POA: Diagnosis not present

## 2020-09-06 DIAGNOSIS — E1121 Type 2 diabetes mellitus with diabetic nephropathy: Secondary | ICD-10-CM | POA: Diagnosis not present

## 2020-09-06 DIAGNOSIS — E785 Hyperlipidemia, unspecified: Secondary | ICD-10-CM | POA: Diagnosis not present

## 2020-09-06 DIAGNOSIS — N183 Chronic kidney disease, stage 3 unspecified: Secondary | ICD-10-CM | POA: Diagnosis not present

## 2020-09-09 DIAGNOSIS — E1122 Type 2 diabetes mellitus with diabetic chronic kidney disease: Secondary | ICD-10-CM | POA: Diagnosis not present

## 2020-09-09 DIAGNOSIS — E785 Hyperlipidemia, unspecified: Secondary | ICD-10-CM | POA: Diagnosis not present

## 2020-09-16 DIAGNOSIS — Z1339 Encounter for screening examination for other mental health and behavioral disorders: Secondary | ICD-10-CM | POA: Diagnosis not present

## 2020-09-16 DIAGNOSIS — Z0000 Encounter for general adult medical examination without abnormal findings: Secondary | ICD-10-CM | POA: Diagnosis not present

## 2020-09-16 DIAGNOSIS — E1122 Type 2 diabetes mellitus with diabetic chronic kidney disease: Secondary | ICD-10-CM | POA: Diagnosis not present

## 2020-09-16 DIAGNOSIS — N1832 Chronic kidney disease, stage 3b: Secondary | ICD-10-CM | POA: Diagnosis not present

## 2020-09-16 DIAGNOSIS — I129 Hypertensive chronic kidney disease with stage 1 through stage 4 chronic kidney disease, or unspecified chronic kidney disease: Secondary | ICD-10-CM | POA: Diagnosis not present

## 2020-09-16 DIAGNOSIS — Z139 Encounter for screening, unspecified: Secondary | ICD-10-CM | POA: Diagnosis not present

## 2020-09-16 DIAGNOSIS — N183 Chronic kidney disease, stage 3 unspecified: Secondary | ICD-10-CM | POA: Diagnosis not present

## 2020-09-16 DIAGNOSIS — I700 Atherosclerosis of aorta: Secondary | ICD-10-CM | POA: Diagnosis not present

## 2020-09-16 DIAGNOSIS — E785 Hyperlipidemia, unspecified: Secondary | ICD-10-CM | POA: Diagnosis not present

## 2020-09-16 DIAGNOSIS — Z1331 Encounter for screening for depression: Secondary | ICD-10-CM | POA: Diagnosis not present

## 2020-10-07 DIAGNOSIS — I129 Hypertensive chronic kidney disease with stage 1 through stage 4 chronic kidney disease, or unspecified chronic kidney disease: Secondary | ICD-10-CM | POA: Diagnosis not present

## 2020-10-07 DIAGNOSIS — E1121 Type 2 diabetes mellitus with diabetic nephropathy: Secondary | ICD-10-CM | POA: Diagnosis not present

## 2020-10-07 DIAGNOSIS — C50919 Malignant neoplasm of unspecified site of unspecified female breast: Secondary | ICD-10-CM | POA: Diagnosis not present

## 2020-11-06 DIAGNOSIS — I129 Hypertensive chronic kidney disease with stage 1 through stage 4 chronic kidney disease, or unspecified chronic kidney disease: Secondary | ICD-10-CM | POA: Diagnosis not present

## 2020-11-06 DIAGNOSIS — C50919 Malignant neoplasm of unspecified site of unspecified female breast: Secondary | ICD-10-CM | POA: Diagnosis not present

## 2020-11-06 DIAGNOSIS — E1121 Type 2 diabetes mellitus with diabetic nephropathy: Secondary | ICD-10-CM | POA: Diagnosis not present

## 2020-12-07 DIAGNOSIS — E1122 Type 2 diabetes mellitus with diabetic chronic kidney disease: Secondary | ICD-10-CM | POA: Diagnosis not present

## 2020-12-07 DIAGNOSIS — I129 Hypertensive chronic kidney disease with stage 1 through stage 4 chronic kidney disease, or unspecified chronic kidney disease: Secondary | ICD-10-CM | POA: Diagnosis not present

## 2020-12-07 DIAGNOSIS — E1121 Type 2 diabetes mellitus with diabetic nephropathy: Secondary | ICD-10-CM | POA: Diagnosis not present

## 2020-12-09 ENCOUNTER — Inpatient Hospital Stay: Payer: Medicare HMO | Admitting: Oncology

## 2021-01-04 DIAGNOSIS — M7989 Other specified soft tissue disorders: Secondary | ICD-10-CM | POA: Diagnosis not present

## 2021-01-04 DIAGNOSIS — Z6829 Body mass index (BMI) 29.0-29.9, adult: Secondary | ICD-10-CM | POA: Diagnosis not present

## 2021-01-04 DIAGNOSIS — E1121 Type 2 diabetes mellitus with diabetic nephropathy: Secondary | ICD-10-CM | POA: Diagnosis not present

## 2021-01-06 DIAGNOSIS — E1121 Type 2 diabetes mellitus with diabetic nephropathy: Secondary | ICD-10-CM | POA: Diagnosis not present

## 2021-01-06 DIAGNOSIS — Z1231 Encounter for screening mammogram for malignant neoplasm of breast: Secondary | ICD-10-CM | POA: Diagnosis not present

## 2021-01-06 DIAGNOSIS — I129 Hypertensive chronic kidney disease with stage 1 through stage 4 chronic kidney disease, or unspecified chronic kidney disease: Secondary | ICD-10-CM | POA: Diagnosis not present

## 2021-01-12 DIAGNOSIS — M79642 Pain in left hand: Secondary | ICD-10-CM | POA: Diagnosis not present

## 2021-01-12 DIAGNOSIS — M79641 Pain in right hand: Secondary | ICD-10-CM | POA: Diagnosis not present

## 2021-01-12 DIAGNOSIS — M79602 Pain in left arm: Secondary | ICD-10-CM | POA: Diagnosis not present

## 2021-01-12 DIAGNOSIS — M7989 Other specified soft tissue disorders: Secondary | ICD-10-CM | POA: Diagnosis not present

## 2021-01-12 DIAGNOSIS — Z6829 Body mass index (BMI) 29.0-29.9, adult: Secondary | ICD-10-CM | POA: Diagnosis not present

## 2021-01-12 DIAGNOSIS — M79601 Pain in right arm: Secondary | ICD-10-CM | POA: Diagnosis not present

## 2021-01-16 DIAGNOSIS — E785 Hyperlipidemia, unspecified: Secondary | ICD-10-CM | POA: Diagnosis not present

## 2021-01-16 DIAGNOSIS — N1832 Chronic kidney disease, stage 3b: Secondary | ICD-10-CM | POA: Diagnosis not present

## 2021-01-16 DIAGNOSIS — E1122 Type 2 diabetes mellitus with diabetic chronic kidney disease: Secondary | ICD-10-CM | POA: Diagnosis not present

## 2021-01-17 DIAGNOSIS — D0511 Intraductal carcinoma in situ of right breast: Secondary | ICD-10-CM | POA: Diagnosis not present

## 2021-01-23 DIAGNOSIS — M19042 Primary osteoarthritis, left hand: Secondary | ICD-10-CM | POA: Diagnosis not present

## 2021-01-23 DIAGNOSIS — Z20822 Contact with and (suspected) exposure to covid-19: Secondary | ICD-10-CM | POA: Diagnosis not present

## 2021-01-23 DIAGNOSIS — M19041 Primary osteoarthritis, right hand: Secondary | ICD-10-CM | POA: Diagnosis not present

## 2021-01-23 DIAGNOSIS — R310 Gross hematuria: Secondary | ICD-10-CM | POA: Diagnosis not present

## 2021-01-23 DIAGNOSIS — J441 Chronic obstructive pulmonary disease with (acute) exacerbation: Secondary | ICD-10-CM | POA: Diagnosis not present

## 2021-01-23 DIAGNOSIS — R051 Acute cough: Secondary | ICD-10-CM | POA: Diagnosis not present

## 2021-01-27 DIAGNOSIS — M19042 Primary osteoarthritis, left hand: Secondary | ICD-10-CM | POA: Diagnosis not present

## 2021-01-27 DIAGNOSIS — M19041 Primary osteoarthritis, right hand: Secondary | ICD-10-CM | POA: Diagnosis not present

## 2021-01-27 DIAGNOSIS — J449 Chronic obstructive pulmonary disease, unspecified: Secondary | ICD-10-CM | POA: Diagnosis not present

## 2021-01-27 DIAGNOSIS — R310 Gross hematuria: Secondary | ICD-10-CM | POA: Diagnosis not present

## 2021-01-31 DIAGNOSIS — E872 Acidosis: Secondary | ICD-10-CM | POA: Diagnosis not present

## 2021-01-31 DIAGNOSIS — D631 Anemia in chronic kidney disease: Secondary | ICD-10-CM | POA: Diagnosis not present

## 2021-01-31 DIAGNOSIS — N261 Atrophy of kidney (terminal): Secondary | ICD-10-CM | POA: Diagnosis not present

## 2021-01-31 DIAGNOSIS — N1832 Chronic kidney disease, stage 3b: Secondary | ICD-10-CM | POA: Diagnosis not present

## 2021-01-31 DIAGNOSIS — R809 Proteinuria, unspecified: Secondary | ICD-10-CM | POA: Diagnosis not present

## 2021-01-31 DIAGNOSIS — I129 Hypertensive chronic kidney disease with stage 1 through stage 4 chronic kidney disease, or unspecified chronic kidney disease: Secondary | ICD-10-CM | POA: Diagnosis not present

## 2021-01-31 DIAGNOSIS — E1122 Type 2 diabetes mellitus with diabetic chronic kidney disease: Secondary | ICD-10-CM | POA: Diagnosis not present

## 2021-02-06 DIAGNOSIS — E1121 Type 2 diabetes mellitus with diabetic nephropathy: Secondary | ICD-10-CM | POA: Diagnosis not present

## 2021-02-06 DIAGNOSIS — N183 Chronic kidney disease, stage 3 unspecified: Secondary | ICD-10-CM | POA: Diagnosis not present

## 2021-02-06 DIAGNOSIS — I129 Hypertensive chronic kidney disease with stage 1 through stage 4 chronic kidney disease, or unspecified chronic kidney disease: Secondary | ICD-10-CM | POA: Diagnosis not present

## 2021-02-08 NOTE — Progress Notes (Signed)
[name]  [address] [place],  [place]  [address] [phone]  Clinic Day:  02/15/2021  Referring physician: Marco Collie, MD  This document serves as a record of services personally performed by Hosie Poisson, MD. It was created on their behalf by Curry,Lauren E, a trained medical scribe. The creation of this record is based on the scribe's personal observations and the provider's statements to them.  CHIEF COMPLAINT:  CC: Stage IA hormone receptor positive right breast cancer  Current Treatment:  Anastrozole 1 mg daily for a total of 5 years   HISTORY OF PRESENT ILLNESS:  Amber [name] is a 75 y.o. 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  She was treated with a right simple mastectomy.  She had not been undergoing routine annual mammograms, then had a screening mammogram in June showing possible masses in both breasts.  Diagnostic mammogram which showed a 7 x 3 x 7 mm lobular hypoechoic mass in the right breast at the 10 o'clock position 10 cm from the nipple.  Additional bilateral oval circumscribed masses favored to represent cysts or complicated cysts were also seen.  She had a biopsy of the right breast in July, which revealed ductal carcinoma in situ partially involving a complex sclerosing lesion.  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  No atypia, in situ or invasive malignancy was identified.  She had an MRI was done in early August, which revealed a suspicious lesion in the upper inner quadrant of the right breast, as well as multiple small enhancing masses in the left breast consistent with benign lesions.  Axillary lymph nodes appeared normal.  She then had a final biopsy of the upper inner quadrant of the right breast in August.  Pathology revealed invasive  ductal carcinoma, grade 1, and ductal carcinoma in situ.  Estrogen and progesterone receptors were both positive at 100% and HER2 was negative.  Ki67 was 5%.  She underwent right simple mastectomy and right axillary sentinel lymph node biopsy in September.  Three sentinel lymph nodes were negative for metastasis.  Adjuvant chemotherapy and radiation therapy were not recommended.  Bone density scan in June 2020 revealed osteopenia with a T-score of -2.1 in the left femur neck and a T-score of -1.5 in the spine, for which she is on calcium and vitamin-D.  She was placed on anastrozole 1 mg daily in October 2020.  Left diagnostic mammogram and breast ultrasound in June revealed 3 small, benign left breast cysts, which do not need any further follow-up.  There is no evidence of malignancy in the left breast.  She has stage IV chronic kidney disease and is followed by a nephrologist, Dr. Royce Macadamia.  INTERVAL HISTORY:  Amber [name] is here for routine follow up and reports pain, swelling and stiffness of the bilateral hands.  This has been ongoing for 2-3 months, but it improved after being treated for a kidney infection which presented as gross hematuria.  Now, it has worsened over the last month.  She continues anastrozole daily without significant difficulty.  Left mammogram on 01/06/21 was clear. She denies any changes in her right mastectomy site or left breast.  Her  appetite is good, and she has lost 3 pounds since her last visit.  She denies fever, chills or other signs of infection.  She denies nausea, vomiting, bowel issues, or abdominal pain.  She denies sore throat, cough, dyspnea, or chest pain.  REVIEW OF SYSTEMS:  Review of Systems  Constitutional: Negative.  Negative for appetite change, chills, fatigue, fever and unexpected weight change.  HENT:  Negative.    Eyes: Negative.   Respiratory: Negative.  Negative for chest tightness, cough, hemoptysis, shortness of breath and wheezing.   Cardiovascular:  Negative.  Negative for chest pain, leg swelling and palpitations.  Gastrointestinal: Negative.  Negative for abdominal distention, abdominal pain, blood in stool, constipation, diarrhea, nausea and vomiting.  Endocrine: Negative.   Genitourinary: Negative.  Negative for difficulty urinating, dysuria, frequency and hematuria.   Musculoskeletal:  Positive for arthralgias (swelling and stiffness of the bilateral hands). Negative for back pain, flank pain, gait problem and myalgias.  Skin: Negative.   Neurological: Negative.  Negative for dizziness, extremity weakness, gait problem, headaches, light-headedness, numbness, seizures and speech difficulty.  Hematological: Negative.   Psychiatric/Behavioral: Negative.  Negative for depression and sleep disturbance. The patient is not nervous/anxious.     VITALS:  Blood pressure (!) 183/93, pulse 71, temperature 98.2 F (36.8 C), temperature source Oral, resp. rate 18, height 5' 3.5" (1.613 m), weight 178 lb 3.2 oz (80.8 kg), SpO2 95 %.  Wt Readings from Last 3 Encounters:  02/15/21 178 lb 3.2 oz (80.8 kg)  04/18/20 181 lb 12.8 oz (82.5 kg)    Body mass index is 31.07 kg/m.  Performance status (ECOG): 1 - Symptomatic but completely ambulatory  PHYSICAL EXAM:  Physical Exam Constitutional:      General: She is not in acute distress.    Appearance: Normal appearance. She is normal weight.  HENT:     Head: Normocephalic and atraumatic.  Eyes:     General: No scleral icterus.    Extraocular Movements: Extraocular movements intact.     Conjunctiva/sclera: Conjunctivae normal.     Pupils: Pupils are equal, round, and reactive to light.  Cardiovascular:     Rate and Rhythm: Normal rate and regular rhythm.     Pulses: Normal pulses.     Heart sounds: Normal heart sounds. No murmur heard.   No friction rub. No gallop.  Pulmonary:     Effort: Pulmonary effort is normal. No respiratory distress.     Breath sounds: Normal breath sounds.  Chest:   Breasts:    Right: Absent.     Left: Normal.     Comments: Left breast is without masses and right mastectomy is negative Abdominal:     General: Bowel sounds are normal. There is no distension.     Palpations: Abdomen is soft. There is no hepatomegaly, splenomegaly or mass.     Tenderness: There is no abdominal tenderness.  Musculoskeletal:        General: Normal range of motion.     Cervical back: Normal range of motion and neck supple.     Right lower leg: No edema.     Left lower leg: No edema.     Comments: Palmar erythema with swelling and tenderness of all the fingers of both hands  Lymphadenopathy:     Cervical: No cervical adenopathy.  Skin:    General: Skin is warm and dry.  Neurological:     General: No focal deficit present.     Mental Status: She is alert and oriented to person, place, and time. Mental status is at baseline.  Psychiatric:        Mood and Affect: Mood normal.  Behavior: Behavior normal.        Thought Content: Thought content normal.        Judgment: Judgment normal.    LABS:  No flowsheet data found. No flowsheet data found.   STUDIES:  No results found.   Allergies: No Known Allergies  Current Medications: Current Outpatient Medications  Medication Sig Dispense Refill   amLODipine (NORVASC) 5 MG tablet      anastrozole (ARIMIDEX) 1 MG tablet      aspirin 81 MG EC tablet Take by mouth.     Cholecalciferol 125 MCG (5000 UT) capsule Take by mouth.     doxazosin (CARDURA) 2 MG tablet      furosemide (LASIX) 40 MG tablet      glimepiride (AMARYL) 2 MG tablet      hydrALAZINE (APRESOLINE) 100 MG tablet      metoprolol succinate (TOPROL-XL) 50 MG 24 hr tablet      omeprazole (PRILOSEC) 40 MG capsule      pioglitazone (ACTOS) 15 MG tablet      pravastatin (PRAVACHOL) 40 MG tablet      No current facility-administered medications for this visit.    ASSESSMENT & PLAN:   Assessment:   Stage IA hormone receptor positive breast  cancer.  She remains without evidence of recurrence.  She knows to continue anastrozole for at least a total of 5 years, until October 2025.    2.  Osteopenia, for which she takes calcium/vitamin D.  She is due for bone density scan, so I will schedule   this for her.  3.  Severe pain, swelling and stiffness of the bilateral hands.  Every finger on both hands are swollen and she has palmar erythema.  I am concerned that this could represent toxicities from her endocrine therapy or possible rheumatoid arthritis.  Her symptoms did improve while on steroids.  I will draw a sed rate and RA today for further evaluation and plan to discuss this with Dr. Nyra Capes.  Plan: I will schedule her for bone density scan.  In view of the changes of her bilateral hands, I am concerned that this may represent possible toxicity from her endocrine therapy but is more severe and different than we usually see, so it could represent rheumatoid arthritis or other inflammatory arthritis.  I will draw a CBC, CMP, sed rate and RA today and will plan to discuss this with Dr. Nyra Capes.  We will plan to see her back in 6 months for repeat examination.  The patient understands the plans discussed today and is in agreement with them.  She knows to contact our office if she develops concerns regarding her breast cancer.   I provided 15 minutes of face-to-face time during this this encounter and > 50% was spent counseling as documented under my assessment and plan.    Derwood Kaplan, MD [facility] AT [facility] [address] [place] [address] Dept: [phone] Dept Fax: [phone]   I, Rita Ohara, am acting as scribe for Derwood Kaplan, MD  I have reviewed this report as typed by the medical scribe, and it is complete and accurate.

## 2021-02-15 ENCOUNTER — Other Ambulatory Visit: Payer: Self-pay | Admitting: Oncology

## 2021-02-15 ENCOUNTER — Inpatient Hospital Stay: Payer: Medicare HMO

## 2021-02-15 ENCOUNTER — Inpatient Hospital Stay: Payer: Medicare HMO | Attending: Oncology | Admitting: Oncology

## 2021-02-15 ENCOUNTER — Telehealth: Payer: Self-pay | Admitting: Oncology

## 2021-02-15 ENCOUNTER — Other Ambulatory Visit: Payer: Self-pay

## 2021-02-15 ENCOUNTER — Encounter: Payer: Self-pay | Admitting: Oncology

## 2021-02-15 DIAGNOSIS — Z780 Asymptomatic menopausal state: Secondary | ICD-10-CM

## 2021-02-15 DIAGNOSIS — M1990 Unspecified osteoarthritis, unspecified site: Secondary | ICD-10-CM | POA: Diagnosis not present

## 2021-02-15 DIAGNOSIS — M8580 Other specified disorders of bone density and structure, unspecified site: Secondary | ICD-10-CM | POA: Diagnosis not present

## 2021-02-15 DIAGNOSIS — C50211 Malignant neoplasm of upper-inner quadrant of right female breast: Secondary | ICD-10-CM | POA: Insufficient documentation

## 2021-02-15 DIAGNOSIS — D649 Anemia, unspecified: Secondary | ICD-10-CM | POA: Diagnosis not present

## 2021-02-15 LAB — SEDIMENTATION RATE: Sed Rate: 10 mm/h (ref 0–22)

## 2021-02-15 NOTE — Telephone Encounter (Signed)
Per 8/10 LOS next appt scheduled and confirmed by patient 

## 2021-02-17 DIAGNOSIS — M8589 Other specified disorders of bone density and structure, multiple sites: Secondary | ICD-10-CM | POA: Diagnosis not present

## 2021-02-17 LAB — RHEUMATOID FACTOR: Rheumatoid fact SerPl-aCnc: <10 IU/mL (ref <14.0)

## 2021-02-20 ENCOUNTER — Encounter: Payer: Self-pay | Admitting: Oncology

## 2021-02-23 ENCOUNTER — Telehealth: Payer: Self-pay

## 2021-02-23 NOTE — Telephone Encounter (Addendum)
  Kainen Struckman,RN _ I spoke with pt, notified her to stop the anastrozole. I made her a f/u appt for 03/20/21 for labs & to see Dr Hinton Rao.   RE: Lab results Received: Today Phy, Beverly Gust, [facility]  Derwood Kaplan, MD; Dairl Ponder, RN Phone Number: [phone]   There is a case report of anastrozole causing pain and swelling of the wrists.  See link below.   [email or web address]   The swelling resolved in this case by discontinuing anastrozole and administration of a local corticosteroid injection.        Previous Messages   ----- Message -----  From: Derwood Kaplan, MD  Sent: 02/23/2021   6:35 PM EDT  To: Juanetta Beets, RPH, Wilmar Prabhakar Cherylann Banas, RN  Subject: RE: Lab results                                 I spoke with Dr. Maudry Mayhew and we are both puzzled by her hand inflammation.  I recommend we stop the anastrazole for a few weeks to see if it resolves.  I usually don't see this pattern of symmetrical swelling and pain of entire hands but it is possible.  See what Manuela Schwartz thinks.  Then I will need to see her next month to decide on other treatment  ----- Message -----    RE: Lab results Received: Today Derwood Kaplan, MD  Dairl Ponder, RN Phone Number: [phone]   Her creat was 1.2 at [facility] last year so is a change.  Pls send copies of all labs to Dr. Nyra Capes          ----- Message from Derwood Kaplan, MD sent at 02/23/2021  4:19 PM EDT ----- Regarding: RE: Lab results Contact: [phone] Her rheumatoid factor and sed rate were normal.  Her K is mildly low, prob from her Lasix, so rec she increase K in her diet.  Kidney function moderately abn with creat 1.7 and BUN 33, so getting dehydrated, but we need Dr. Nyra Capes to advise regarding the Lasix.  I don't have much prior readings so I don't know if this is change for her.  She is mildly anemic, hgb 11.5, normal is 12 and above.  I did try to call Dr. Nyra Capes, got  cut off on main line and her cell phone mail box is full. Will keep trying ----- Message ----- From: Dairl Ponder, RN Sent: 02/23/2021   1:54 PM EDT To: Derwood Kaplan, MD Subject: Lab results                                    Pt called to ask about the lab results drawn @ last visit.

## 2021-03-03 ENCOUNTER — Telehealth: Payer: Self-pay

## 2021-03-03 NOTE — Telephone Encounter (Signed)
-----   Message from Derwood Kaplan, MD sent at 03/03/2021  2:53 PM EDT ----- Regarding: call pt Tell her bone density is stable, still osteopenia but exactly the same, no worse

## 2021-03-03 NOTE — Telephone Encounter (Signed)
Patient notified

## 2021-03-09 DIAGNOSIS — N183 Chronic kidney disease, stage 3 unspecified: Secondary | ICD-10-CM | POA: Diagnosis not present

## 2021-03-09 DIAGNOSIS — I129 Hypertensive chronic kidney disease with stage 1 through stage 4 chronic kidney disease, or unspecified chronic kidney disease: Secondary | ICD-10-CM | POA: Diagnosis not present

## 2021-03-09 DIAGNOSIS — E1121 Type 2 diabetes mellitus with diabetic nephropathy: Secondary | ICD-10-CM | POA: Diagnosis not present

## 2021-03-10 NOTE — Progress Notes (Signed)
[place]  [address] [place],  [place]  [address] [phone]  Clinic Day:  03/20/2021  Referring physician: Marco Collie, MD  This document serves as a record of services personally performed by Hosie Poisson, MD. It was created on their behalf by Curry,Lauren E, a trained medical scribe. The creation of this record is based on the scribe's personal observations and the provider's statements to them.  CHIEF COMPLAINT:  CC: Stage IA hormone receptor positive right breast cancer  Current Treatment:  Anastrozole 1 mg daily currently on hold   HISTORY OF PRESENT ILLNESS:  Amber [name] is a 75 y.o. 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  She was treated with a right simple mastectomy.  She had not been undergoing routine annual mammograms, then had a screening mammogram in June showing possible masses in both breasts.  Diagnostic mammogram which showed a 7 x 3 x 7 mm lobular hypoechoic mass in the right breast at the 10 o'clock position 10 cm from the nipple.  Additional bilateral oval circumscribed masses favored to represent cysts or complicated cysts were also seen.  She had a biopsy of the right breast in July, which revealed ductal carcinoma in situ partially involving a complex sclerosing lesion.  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  No atypia, in situ or invasive malignancy was identified.  She had an MRI was done in early August, which revealed a suspicious lesion in the upper inner quadrant of the right breast, as well as multiple small enhancing masses in the left breast consistent with benign lesions.  Axillary lymph nodes appeared normal.  She then had a final biopsy of the upper inner quadrant of the right breast in August.  Pathology revealed invasive  ductal carcinoma, grade 1, and ductal carcinoma in situ.  Estrogen and progesterone receptors were both positive at 100% and HER2 was negative.  Ki67 was 5%.  She underwent right simple mastectomy and right axillary sentinel lymph node biopsy in September.  Three sentinel lymph nodes were negative for metastasis.  Adjuvant chemotherapy and radiation therapy were not recommended.  Bone density scan in June 2020 revealed osteopenia with a T-score of -2.1 in the left femur neck and a T-score of -1.5 in the spine, for which she is on calcium and vitamin-D.  She was placed on anastrozole 1 mg daily in October 2020.  Left diagnostic mammogram and breast ultrasound in June revealed 3 small, benign left breast cysts, which do not need any further follow-up.  There is no evidence of malignancy in the left breast.  She has stage IV chronic kidney disease and is followed by a nephrologist, Dr. Royce Macadamia.  INTERVAL HISTORY:  Amber [name] is here for routine follow up and continues to have significant swelling of the bilateral hands, left worse than right. I had seen her last month for this problem and had her stop the anastrozole 3 weeks ago, but she has not had significant improvement. She also notes severe pain, neuropathy and paresthesias of the right hand and throbbing pain of the left hand. She is having difficulty even using her hands. Rheumatoid factor was normal, but I am still suspicious of rheumatoid arthritis. I have discussed her case with Dr. Nyra Capes and called her again today. She will arrange for a STAT rheumatology  referral. Her granddaughter also notes labored breathing.  Bone density from August revealed stable to improved osteopenia with a T-score of -2.0 of the left femur neck, previously -2.1.  Dual femur total mean measures -1.7, stable.  AP Spine measures -1.5, stable. Hemoglobin has decreased from 11.5 to 10.8 and white count and platelets are normal. Chemistries are unremarkable except for a potassium of 3.2,  worse, a BUN of 31, a creatinine of 2.1, previously 1.7, and a total protein of 6.2. Her  appetite is good, and she has gained 1 and 1/2 pounds since her last visit.  She denies fever, chills or other signs of infection.  She denies nausea, vomiting, bowel issues, or abdominal pain.  She denies sore throat, cough, dyspnea, or chest pain.  REVIEW OF SYSTEMS:  Review of Systems  Constitutional: Negative.  Negative for appetite change, chills, fatigue, fever and unexpected weight change.  HENT:  Negative.    Eyes: Negative.   Respiratory:  Negative for chest tightness, cough, hemoptysis, shortness of breath and wheezing.   Cardiovascular: Negative.  Negative for chest pain, leg swelling and palpitations.  Gastrointestinal: Negative.  Negative for abdominal distention, abdominal pain, blood in stool, constipation, diarrhea, nausea and vomiting.  Endocrine: Negative.   Genitourinary: Negative.  Negative for difficulty urinating, dysuria, frequency and hematuria.   Musculoskeletal: Negative.  Negative for arthralgias, back pain, flank pain, gait problem and myalgias.       Swelling of the bilateral hands, left worse than right; throbbing pain of the left hand  Skin: Negative.   Neurological:  Positive for numbness (and paresthesias of the right hand). Negative for dizziness, extremity weakness, gait problem, headaches, light-headedness, seizures and speech difficulty.  Hematological: Negative.   Psychiatric/Behavioral: Negative.  Negative for depression and sleep disturbance. The patient is not nervous/anxious.     VITALS:  Blood pressure 128/78, pulse 67, temperature 97.8 F (36.6 C), temperature source Oral, resp. rate (!) 22, height 5' 3.5" (1.613 m), weight 179 lb 8 oz (81.4 kg), SpO2 96 %.  Wt Readings from Last 3 Encounters:  03/20/21 179 lb 8 oz (81.4 kg)  02/15/21 178 lb 3.2 oz (80.8 kg)  04/18/20 181 lb 12.8 oz (82.5 kg)    Body mass index is 31.3 kg/m.  Performance status (ECOG): 1  - Symptomatic but completely ambulatory  PHYSICAL EXAM:  Physical Exam Constitutional:      General: She is not in acute distress.    Appearance: Normal appearance. She is normal weight.  HENT:     Head: Normocephalic and atraumatic.  Eyes:     General: No scleral icterus.    Extraocular Movements: Extraocular movements intact.     Conjunctiva/sclera: Conjunctivae normal.     Pupils: Pupils are equal, round, and reactive to light.  Cardiovascular:     Rate and Rhythm: Normal rate and regular rhythm.     Pulses: Normal pulses.     Heart sounds: Normal heart sounds. No murmur heard.   No friction rub. No gallop.  Pulmonary:     Effort: Pulmonary effort is normal. No respiratory distress.     Breath sounds: Normal breath sounds.  Abdominal:     General: Bowel sounds are normal. There is no distension.     Palpations: Abdomen is soft. There is no hepatomegaly, splenomegaly or mass.     Tenderness: There is no abdominal tenderness.  Musculoskeletal:        General: Normal range of motion.     Right hand: Swelling (  warmth and erythema) present.     Left hand: Swelling (warmth and erythema) present.     Cervical back: Normal range of motion and neck supple.     Right lower leg: No edema.     Left lower leg: No edema.     Comments: Palmar erythema and warmth; mild crepitation of the bilateral knees, left greater than right  Lymphadenopathy:     Cervical: No cervical adenopathy.  Skin:    General: Skin is warm and dry.  Neurological:     General: No focal deficit present.     Mental Status: She is alert and oriented to person, place, and time. Mental status is at baseline.  Psychiatric:        Mood and Affect: Mood normal.        Behavior: Behavior normal.        Thought Content: Thought content normal.        Judgment: Judgment normal.   LABS:   CBC Latest Ref Rng & Units 03/20/2021  WBC - 8.9  Hemoglobin 12.0 - 16.0 10.8(A)  Hematocrit 36 - 46 33(A)  Platelets 150 - 399  188   CMP Latest Ref Rng & Units 03/20/2021  BUN 4 - 21 31(A)  Creatinine 0.5 - 1.1 2.1(A)  Sodium 137 - 147 137  Potassium 3.4 - 5.3 3.2(A)  Chloride 99 - 108 104  CO2 13 - 22 22  Calcium 8.7 - 10.7 8.8  Total Protein g/dL 6.2  Alkaline Phos 25 - 125 87  AST 13 - 35 24  ALT 7 - 35 11     STUDIES:  No results found.   EXAM: 02/17/2021 DUAL X-RAY ABSORPTIOMETRY (DXA) FOR BONE MINERAL DENSITY  IMPRESSION: Amber [name] completed a BMD test on 02/17/2021 using the [place] (analysis version: 13.60) manufactured by [employer]. The following summarizes the results of our evaluation. Technologist: RMG  PATIENT BIOGRAPHICAL: Name: Amber [name], Amber [name] Patient ID: [id number] [facility] Birth Date: Mar 20, 1946 Height: 64.0 in. Gender: Female Exam Date: 02/17/2021 Weight: 178.6 lbs. Indications: Secondary Osteoporosis Fractures: Treatments:  ASSESSMENT: The BMD measured at Femur Neck Left is 0.757 g/cm2 with a T-score of -2.0. This patient is considered osteopenic according to [facility]) criteria. The scan quality is good. Exclusions: L3 due to degenerative changes.  Site Region Measured Measured WHO Young Adult BMD Date      Age      Classification T-score AP Spine L1-L4 (L3) 1/17/2019 77.3 Osteopenia -1.5 0.990 g/cm2 AP Spine L1-L4 (L3) 12/31/2018 75.2 Osteopenia -1.5 0.987 g/cm2 AP Spine L1-L4 (L3) 11/30/2011 68.1 Osteopenia -1.7 0.969 g/cm2  DualFemur Neck Left 1/17/2019 77.3 Osteopenia -2.0 0.757 g/cm2 DualFemur Neck Left 12/31/2018 75.2 Osteopenia -2.1 0.749 g/cm2 DualFemur Neck Left 11/30/2011 68.1 Osteopenia -1.9 0.770 g/cm2  DualFemur Total Mean 02/17/2021 77.3 Osteopenia -1.7 0.788 g/cm2 DualFemur Total Mean 12/31/2018 75.2 Osteopenia -1.7 0.798 g/cm2 DualFemur Total Mean 11/30/2011 68.1 Osteopenia -1.8 0.785 g/cm2  Allergies: No Known Allergies  Current Medications: Current Outpatient Medications  Medication Sig Dispense Refill   amLODipine  (NORVASC) 5 MG tablet      aspirin 81 MG EC tablet Take by mouth.     Cholecalciferol 125 MCG (5000 UT) capsule Take by mouth.     doxazosin (CARDURA) 2 MG tablet      furosemide (LASIX) 40 MG tablet      glimepiride (AMARYL) 2 MG tablet      hydrALAZINE (APRESOLINE) 100 MG tablet  metoprolol succinate (TOPROL-XL) 50 MG 24 hr tablet      omeprazole (PRILOSEC) 40 MG capsule      pioglitazone (ACTOS) 15 MG tablet      pravastatin (PRAVACHOL) 40 MG tablet      No current facility-administered medications for this visit.    ASSESSMENT & PLAN:   Assessment:   Stage IA hormone receptor positive breast cancer, diagnosed in July 2020.  She remains without evidence of recurrence.  Anastrozole remains on hold due to her significant bilateral hand swelling.  We will discuss further hormonal treatment for her breast cancer after this is resolved.  2. Osteopenia, for which she takes calcium/vitamin D.  She will be due for repeat bone density scan in August 2024.  3. Severe pain, swelling and stiffness of the bilateral hands. Every finger on both hands are swollen and she has palmar erythema. Her symptoms did improve somewhat while on steroids. Her symptoms remain despite holding anastrozole, and this seems to be more inflammatory in nature. Sed rate and RA were normal, but I am still suspicious of rheumatoid arthritis.  I feel she would benefit from consultation with rheumatology, and Dr. Nyra Capes will make a STAT referral.  In the meantime, I will place her on prednisone starting at 60 mg to slowly taper by 10 mg every 3 days.  4. Anemia, mild.  She does continue oral iron supplement a couple times per week. I will evaluate further with iron studies, B12 and folate.  5. Worsening renal function. She knows to hold furosemide unless she develops severe swelling. She is scheduled with her nephrologist, Dr. Harrie Jeans, in October.  6. Hypokalemia. I will place her on oral supplement, 20 meq  daily.  Plan: I have spoken with Dr. Nyra Capes and she will make a STAT referral to rheumatology in view of her severe hand symptoms. In the meantime, I will place her on prednisone 60 mg daily to slowly taper and she will see Dr. Nyra Capes at the end of the week. She will continue to hold anastrozole. For her pain, I will try her on tramadol 50 mg Q6H prn. In view of her worsening renal function advised that she discontinue furosemide for now unless she develops severe swelling. I will also send in for oral potassium 20 meq daily. As she is more anemic, I will evaluate further with iron studies, B12 and folate. We will plan to see her back in 2 months with CBC and CMP for repeat examination.  The patient understands the plans discussed today and is in agreement with them.  She knows to contact our office if she develops concerns regarding her breast cancer.   I provided 20 minutes of face-to-face time during this this encounter and > 50% was spent counseling as documented under my assessment and plan.    Derwood Kaplan, MD [facility] [address] [place] [address] Dept: [phone] Dept Fax: [phone]   I, Rita Ohara, am acting as scribe for Derwood Kaplan, MD  I have reviewed this report as typed by the medical scribe, and it is complete and accurate.  ADDENDUM:  Her B12 level is low at 190 and she may require injections.  Her iron level is low at 34 with an iron saturation of 8% and ferritin of 17.  I will counsel her that she needs to get on oral iron but she may also need a GI evaluation.  Folate is  normal.

## 2021-03-17 ENCOUNTER — Other Ambulatory Visit: Payer: Self-pay | Admitting: Oncology

## 2021-03-17 DIAGNOSIS — C50211 Malignant neoplasm of upper-inner quadrant of right female breast: Secondary | ICD-10-CM

## 2021-03-20 ENCOUNTER — Other Ambulatory Visit: Payer: Self-pay

## 2021-03-20 ENCOUNTER — Other Ambulatory Visit: Payer: Self-pay | Admitting: Oncology

## 2021-03-20 ENCOUNTER — Telehealth: Payer: Self-pay | Admitting: Oncology

## 2021-03-20 ENCOUNTER — Other Ambulatory Visit: Payer: Self-pay | Admitting: Hematology and Oncology

## 2021-03-20 ENCOUNTER — Inpatient Hospital Stay: Payer: Medicare HMO

## 2021-03-20 ENCOUNTER — Inpatient Hospital Stay: Payer: Medicare HMO | Attending: Oncology | Admitting: Oncology

## 2021-03-20 ENCOUNTER — Encounter: Payer: Self-pay | Admitting: Oncology

## 2021-03-20 VITALS — BP 128/78 | HR 67 | Temp 97.8°F | Resp 22 | Ht 63.5 in | Wt 179.5 lb

## 2021-03-20 DIAGNOSIS — Z780 Asymptomatic menopausal state: Secondary | ICD-10-CM

## 2021-03-20 DIAGNOSIS — D519 Vitamin B12 deficiency anemia, unspecified: Secondary | ICD-10-CM | POA: Diagnosis not present

## 2021-03-20 DIAGNOSIS — D649 Anemia, unspecified: Secondary | ICD-10-CM | POA: Insufficient documentation

## 2021-03-20 DIAGNOSIS — D631 Anemia in chronic kidney disease: Secondary | ICD-10-CM | POA: Diagnosis not present

## 2021-03-20 DIAGNOSIS — D539 Nutritional anemia, unspecified: Secondary | ICD-10-CM

## 2021-03-20 DIAGNOSIS — C50211 Malignant neoplasm of upper-inner quadrant of right female breast: Secondary | ICD-10-CM

## 2021-03-20 DIAGNOSIS — M8580 Other specified disorders of bone density and structure, unspecified site: Secondary | ICD-10-CM

## 2021-03-20 DIAGNOSIS — N184 Chronic kidney disease, stage 4 (severe): Secondary | ICD-10-CM | POA: Diagnosis not present

## 2021-03-20 DIAGNOSIS — N189 Chronic kidney disease, unspecified: Secondary | ICD-10-CM | POA: Insufficient documentation

## 2021-03-20 DIAGNOSIS — D509 Iron deficiency anemia, unspecified: Secondary | ICD-10-CM

## 2021-03-20 HISTORY — DX: Anemia, unspecified: D64.9

## 2021-03-20 LAB — COMPREHENSIVE METABOLIC PANEL
Albumin: 4 (ref 3.5–5.0)
Calcium: 8.8 (ref 8.7–10.7)

## 2021-03-20 LAB — BASIC METABOLIC PANEL
BUN: 31 — AB (ref 4–21)
CO2: 22 (ref 13–22)
Chloride: 104 (ref 99–108)
Creatinine: 2.1 — AB (ref 0.5–1.1)
Glucose: 204
Potassium: 3.2 — AB (ref 3.4–5.3)
Sodium: 137 (ref 137–147)

## 2021-03-20 LAB — CBC AND DIFFERENTIAL
HCT: 33 — AB (ref 36–46)
Hemoglobin: 10.8 — AB (ref 12.0–16.0)
Neutrophils Absolute: 6.68
Platelets: 188 (ref 150–399)
WBC: 8.9

## 2021-03-20 LAB — IRON AND TIBC
Iron: 34 ug/dL (ref 28–170)
Saturation Ratios: 8 % — ABNORMAL LOW (ref 10.4–31.8)
TIBC: 408 ug/dL (ref 250–450)
UIBC: 374 ug/dL

## 2021-03-20 LAB — HEPATIC FUNCTION PANEL
ALT: 11 (ref 7–35)
AST: 24 (ref 13–35)
Alkaline Phosphatase: 87 (ref 25–125)
Bilirubin, Total: 0.3

## 2021-03-20 LAB — FOLATE: Folate: 21.5 ng/mL (ref >5.9)

## 2021-03-20 LAB — FERRITIN: Ferritin: 17 ng/mL (ref 11–307)

## 2021-03-20 LAB — PROTEIN, TOTAL: Total Protein: 6.2 g/dL

## 2021-03-20 LAB — CBC
MCV: 80 — AB (ref 81–99)
RBC: 4.12 (ref 3.87–5.11)

## 2021-03-20 LAB — VITAMIN B12: Vitamin B-12: 190 pg/mL (ref 180–914)

## 2021-03-20 MED ORDER — PREDNISONE 10 MG PO TABS: ORAL_TABLET | ORAL | 0 refills | Status: AC

## 2021-03-20 MED ORDER — POTASSIUM CHLORIDE CRYS ER 20 MEQ PO TBCR: 20.0000 meq | EXTENDED_RELEASE_TABLET | Freq: Every day | ORAL | 0 refills | Status: DC

## 2021-03-20 MED ORDER — TRAMADOL HCL 50 MG PO TABS: 50.0000 mg | ORAL_TABLET | Freq: Four times a day (QID) | ORAL | 0 refills | Status: DC | PRN

## 2021-03-20 NOTE — Telephone Encounter (Signed)
Per 9/12 LOS next appt scheduled and given to patient

## 2021-03-22 ENCOUNTER — Other Ambulatory Visit: Payer: Self-pay | Admitting: Oncology

## 2021-03-23 DIAGNOSIS — D519 Vitamin B12 deficiency anemia, unspecified: Secondary | ICD-10-CM

## 2021-03-23 DIAGNOSIS — D509 Iron deficiency anemia, unspecified: Secondary | ICD-10-CM | POA: Insufficient documentation

## 2021-03-23 HISTORY — DX: Vitamin B12 deficiency anemia, unspecified: D51.9

## 2021-03-23 HISTORY — DX: Iron deficiency anemia, unspecified: D50.9

## 2021-03-24 DIAGNOSIS — R600 Localized edema: Secondary | ICD-10-CM | POA: Diagnosis not present

## 2021-03-24 DIAGNOSIS — Z6829 Body mass index (BMI) 29.0-29.9, adult: Secondary | ICD-10-CM | POA: Diagnosis not present

## 2021-03-24 DIAGNOSIS — Z23 Encounter for immunization: Secondary | ICD-10-CM | POA: Diagnosis not present

## 2021-03-28 DIAGNOSIS — E538 Deficiency of other specified B group vitamins: Secondary | ICD-10-CM | POA: Diagnosis not present

## 2021-03-29 ENCOUNTER — Ambulatory Visit: Payer: Self-pay

## 2021-03-29 ENCOUNTER — Other Ambulatory Visit: Payer: Self-pay

## 2021-03-29 ENCOUNTER — Ambulatory Visit: Payer: Medicare HMO | Admitting: Internal Medicine

## 2021-03-29 ENCOUNTER — Encounter: Payer: Self-pay | Admitting: Internal Medicine

## 2021-03-29 VITALS — BP 182/84 | HR 51 | Ht 64.0 in | Wt 180.2 lb

## 2021-03-29 DIAGNOSIS — M25561 Pain in right knee: Secondary | ICD-10-CM

## 2021-03-29 DIAGNOSIS — M79641 Pain in right hand: Secondary | ICD-10-CM | POA: Diagnosis not present

## 2021-03-29 DIAGNOSIS — G8929 Other chronic pain: Secondary | ICD-10-CM | POA: Diagnosis not present

## 2021-03-29 DIAGNOSIS — M25562 Pain in left knee: Secondary | ICD-10-CM | POA: Diagnosis not present

## 2021-03-29 DIAGNOSIS — M7989 Other specified soft tissue disorders: Secondary | ICD-10-CM | POA: Insufficient documentation

## 2021-03-29 DIAGNOSIS — R200 Anesthesia of skin: Secondary | ICD-10-CM | POA: Insufficient documentation

## 2021-03-29 DIAGNOSIS — M79642 Pain in left hand: Secondary | ICD-10-CM

## 2021-03-29 HISTORY — DX: Anesthesia of skin: R20.0

## 2021-03-29 HISTORY — DX: Pain in right knee: M25.561

## 2021-03-29 HISTORY — DX: Other specified soft tissue disorders: M79.89

## 2021-03-29 NOTE — Progress Notes (Signed)
Office Visit Note  Patient: Amber [name]             Date of Birth: 05-17-1946           MRN: [id number]             PCP: Marco Collie, MD Referring: Marco Collie, MD Visit Date: 03/29/2021 Occupation: Former tailor/seamstress  Subjective:  New Patient (Initial Visit) (Patient complains of bilateral hand pain, bilateral shoulder pain, and bilateral knee pain. Patient has been taking Prednisone with some relief.)   History of Present Illness: Amber [name] is a 75 y.o. female with history of breast cancer and stage IV CKD here for joint pain and swelling involving bilateral hands. 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  Symptoms started about 2 to 3 months ago with pain and swelling initially in the hands then started developing increased pain and stiffness in the shoulders and then in the knees.  At the time also experienced gross hematuria and apparently clinic based labs concern for change in estimated GFR.  She saw her primary care clinic was treated with just a few days of oral antibiotics and steroids with symptom improvement.  Within 1 to 2 weeks she redeveloped the pain and swelling in multiple sites though her hematuria remains resolved.  She followed up with her primary care office and then with her oncologist and was placed back on a longer prednisone taper if referred for evaluation.  The work-up was negative for rheumatoid factor or sedimentation rate elevation.  Anastrozole was stopped due to concern of contributing to this pain and swelling process. She was started on a prednisone taper for symptoms which is ongoing currently down to 10 mg daily.  Joint pains are present most of the time she also feels symptoms of burning and numbness affecting both hands.  She has difficulty gripping tightly and has experienced dropping or losing grip  of things unexpectedly. She denies any skin rashes, reports chronic bruising on extensor surfaces is not new.  No falls.  Labs reviewed 02/2021 RF neg ESR 10    Activities of Daily Living:  Patient reports morning stiffness for 1-2 hours.   Patient Reports nocturnal pain.  Difficulty dressing/grooming: Reports Difficulty climbing stairs: Reports Difficulty getting out of chair: Reports Difficulty using hands for taps, buttons, cutlery, and/or writing: Reports  Review of Systems  Constitutional:  Positive for fatigue.  HENT:  Negative for mouth sores, mouth dryness and nose dryness.   Eyes:  Negative for pain, itching, visual disturbance and dryness.  Respiratory:  Positive for shortness of breath. Negative for cough, hemoptysis and difficulty breathing.   Cardiovascular:  Positive for swelling in legs/feet. Negative for chest pain and palpitations.  Gastrointestinal:  Negative for abdominal pain, blood in stool, constipation and diarrhea.  Endocrine: Negative for increased urination.  Genitourinary:  Negative for painful urination.  Musculoskeletal:  Positive for joint pain, joint pain, joint swelling and morning stiffness. Negative for myalgias, muscle weakness, muscle tenderness and myalgias.  Skin:  Negative for color change, rash and redness.  Allergic/Immunologic: Negative for susceptible to infections.  Neurological:  Positive for numbness and weakness. Negative for dizziness, headaches and memory loss.  Hematological:  Negative for swollen glands.  Psychiatric/Behavioral:  Negative for confusion and sleep disturbance.    PMFS History:  Patient Active Problem List   Diagnosis Date Noted   Bilateral hand swelling  03/29/2021   Bilateral knee pain 03/29/2021   Bilateral hand numbness 03/29/2021   Iron deficiency anemia 03/23/2021    Class: Diagnosis of   B12 deficiency anemia 03/23/2021    Class: Acute   Anemia in chronic kidney disease 03/20/2021    Class: Chronic    Primary cancer of upper inner quadrant of right female breast ([place]) 01/20/2019    Class: Diagnosis of   Osteopenia after menopause 12/19/2018    Class: Chronic   Euthyroid goiter 08/17/2017   Essential hypertension 08/16/2017   Gastroesophageal reflux disease without esophagitis 08/16/2017   HLD (hyperlipidemia) 08/16/2017   Stage 3 chronic kidney disease ([place]) 08/16/2017   Type 2 diabetes mellitus without complication, without long-term current use of insulin ([place]) 08/16/2017   Abnormal stress test 08/15/2017   Chest pain 08/15/2017    Past Medical History:  Diagnosis Date   Chronic kidney disease    Diabetes ([place])    Hypertension     Family History  Problem Relation Age of Onset   Throat cancer Mother    Heart disease Father    Diabetes Sister    Heart disease Sister    Kidney disease Sister    Healthy Son    Healthy Son    Past Surgical History:  Procedure Laterality Date   BACK SURGERY     x 2   BREAST BIOPSY Right    x3   Social History   Social History Narrative   Not on file    There is no immunization history on file for this patient.   Objective: Vital Signs: BP (!) 182/84 (BP Location: Left Arm, Patient Position: Sitting, Cuff Size: Normal)   Pulse (!) 51   Ht 5' 4" (1.626 m)   Wt 180 lb 3.2 oz (81.7 kg)   BMI 30.93 kg/m    Physical Exam HENT:     Mouth/Throat:     Mouth: Mucous membranes are moist.     Pharynx: Oropharynx is clear.  Cardiovascular:     Rate and Rhythm: Normal rate and regular rhythm.  Pulmonary:     Effort: Pulmonary effort is normal.     Breath sounds: Normal breath sounds.  Skin:    General: Skin is warm and dry.     Findings: No rash.  Neurological:     Mental Status: She is alert.     Deep Tendon Reflexes: Reflexes normal.  Psychiatric:        Mood and Affect: Mood normal.     Musculoskeletal Exam:  Neck full ROM no tenderness Shoulders bilateral pain with overhead abduction but range of motion passively  intact Wrists full ROM, mild swelling around left wrist without focal tenderness or warmth Fingers full ROM bilaterally, some tenderness and swelling around fourth MCP with slight skin hyperpigmentation overlying Knees slightly decreased flexion and extension range of motion bilaterally, right knee larger than left with no warmth or erythema is mildly tender, bilateral patellofemoral crepitus present Ankles full ROM no tenderness or swelling   CDAI Exam: CDAI Score: 11  Patient Global: 30 mm; Provider Global: 20 mm Swollen: 1 ; Tender: 5  Joint Exam 03/29/2021      Right  Left  Glenohumeral   Tender   Tender  Wrist      Tender  MCP 4     Swollen Tender  Knee   Tender        Investigation: No additional findings.  Imaging: XR Hand 2 View Left  Result Date: 03/31/2021  X-ray left hand 2 views Radiocarpal joint space appears normal.  Few cystic changes present in the carpal bones.  Mild degenerative change in first [facility] joint.  MCP and PIP joint spaces appear normal.  Mild asymmetric narrowing at DIP joints.  Bone mineralization consistent with severe osteopenia. Impression Mild degenerative arthritis at first [facility] and DIP joints, generalized osteopenia could be seen in chronic inflammatory arthritis or from systemic disease  XR Hand 2 View Right  Result Date: 03/31/2021 X-ray right hand 2 views Radiocarpal joint space appears normal.  Multiple cystic changes present throughout the carpal bones.  Mild first CMC joint degenerative arthritis change.  MCP and PIP joint spaces appear normal.  DIP joint space narrowing most advanced at the second digit.  Bone mineralization showing severe osteopenia. Impression Degenerative joint changes in first [facility] and in DIP joints, generalized osteopenia and multiple cystic changes could also be seen with chronic crystalline arthropathy or systemic disease  XR KNEE 3 VIEW LEFT  Result Date: 03/31/2021 X-ray left knee 3 views Medial worse than lateral  joint space narrowing without significant osteophytes.  Chondrocalcinosis present in both compartments.  Patellofemoral joint space appears normal.  No significant joint swelling seen. Impression Medial worse than lateral compartment osteoarthritis and chondrocalcinosis is present  XR KNEE 3 VIEW RIGHT  Result Date: 03/31/2021 X-ray right knee 3 views Medial and lateral compartment joint spaces appear intact probable early lateral osteophyte formation.  Calcifications present at joint margins bilaterally and joint cartilage surfaces.  Patellofemoral space mildly narrowed on medial aspect with slight lateral displacement.  No significant joint swelling is visible. 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  Procedures:  No procedures performed Allergies: Patient has no known allergies.   Assessment / Plan:     Visit Diagnoses: Bilateral hand swelling - Plan: XR Hand 2 View Right, XR Hand 2 View Left, 14-3-3 eta Protein, Cyclic citrul peptide antibody, IgG, C-reactive protein, Uric acid  Joint pain without synovitis in multiple sites but has overt swelling on the left ring finger today suggestive for inflammatory process.  There is associated overlying skin hyperpigmentation.  Bilateral hand x-rays checked do not show any erosive disease there is mild degenerative arthritis many cystic changes and looks like severe generalized osteopenia.  Could be a new seronegative rheumatoid arthritis but not  typical.  Crystalline arthropathy such as CPPD also possible but not sure what would provoke new polyarticular involvement.  Synovitis localized to specific joints would be very unusual t result from hormone deprivation therapy. Will check additional serology including CCP, _0 eta protein, also uric acid and C-reactive protein.  Chronic pain of both knees   Bilateral knee pain right knee chronically enlarged compared to left no appreciable effusions on exam at this time though.  X-rays of bilateral knees checked show mild osteoarthritis in multiple compartments also chondrocalcinosis present bilaterally.  Bilateral hand numbness   Symptoms are not  typical for carpal tunnel syndrome her treatment regimen did not include common peripheral neuropathy inducing agents.  Lack of symptom radiation suggest against higher level impingement also.  Orders: Orders Placed This Encounter  Procedures   XR Hand 2 View Right   XR Hand 2 View Left   XR KNEE 3 VIEW LEFT   XR KNEE 3 VIEW RIGHT   14-3-3 eta Protein   Cyclic citrul peptide antibody, IgG   C-reactive protein   Uric acid    No orders of the defined types were placed in this encounter.   Follow-Up Instructions: Return in about 19 days (around 04/17/2021) for New pt ?RA swelling f/u 2-3 wks.   Collier Salina, MD  Note - This record has been created using [employer].  Chart creation errors have been sought, but may not always  have been located. Such creation errors do not reflect on  the standard of medical care.

## 2021-04-04 DIAGNOSIS — E538 Deficiency of other specified B group vitamins: Secondary | ICD-10-CM | POA: Diagnosis not present

## 2021-04-06 LAB — CYCLIC CITRUL PEPTIDE ANTIBODY, IGG: Cyclic Citrullin Peptide Ab: <16 UNITS

## 2021-04-06 LAB — 14-3-3 ETA PROTEIN: 14-3-3 eta Protein: <0.2 ng/mL (ref <0.2)

## 2021-04-06 LAB — C-REACTIVE PROTEIN: CRP: 2.5 mg/L (ref <8.0)

## 2021-04-06 LAB — URIC ACID: Uric Acid, Serum: 6.4 mg/dL (ref 2.5–7.0)

## 2021-04-08 DIAGNOSIS — C50919 Malignant neoplasm of unspecified site of unspecified female breast: Secondary | ICD-10-CM | POA: Diagnosis not present

## 2021-04-08 DIAGNOSIS — I129 Hypertensive chronic kidney disease with stage 1 through stage 4 chronic kidney disease, or unspecified chronic kidney disease: Secondary | ICD-10-CM | POA: Diagnosis not present

## 2021-04-08 DIAGNOSIS — E1121 Type 2 diabetes mellitus with diabetic nephropathy: Secondary | ICD-10-CM | POA: Diagnosis not present

## 2021-04-11 DIAGNOSIS — E538 Deficiency of other specified B group vitamins: Secondary | ICD-10-CM | POA: Diagnosis not present

## 2021-04-16 ENCOUNTER — Other Ambulatory Visit: Payer: Self-pay | Admitting: Hematology and Oncology

## 2021-04-16 DIAGNOSIS — E876 Hypokalemia: Secondary | ICD-10-CM

## 2021-04-17 NOTE — Progress Notes (Signed)
Office Visit Note  Patient: Amber [name]             Date of Birth: 1946-03-06           MRN: [id number]             PCP: Marco Collie, MD Referring: Marco Collie, MD Visit Date: 04/18/2021   Subjective:  Follow-up (Bil hand swelling and pain)   History of Present Illness: Amber [name] is a 75 y.o. female here for follow up with hand swelling and pain   Previous HPI 03/29/21 Amber [name] is a 75 y.o. female with history of breast cancer and stage IV CKD here for joint pain and swelling involving bilateral hands. 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  Symptoms started about 2 to 3 months ago with pain and swelling initially in the hands then started developing increased pain and stiffness in the shoulders and then in the knees.  At the time also experienced gross hematuria and apparently clinic based labs concern for change in estimated GFR.  She saw her primary care clinic was treated with just a few days of oral antibiotics and steroids with symptom improvement.  Within 1 to 2 weeks she redeveloped the pain and swelling in multiple sites though her hematuria remains resolved.  She followed up with her primary care office and then with her oncologist and was placed back on a longer prednisone taper if referred for evaluation.  The work-up was negative for rheumatoid factor or sedimentation rate elevation.  Anastrozole was stopped due to concern of contributing to this pain and swelling process. She was started on a prednisone taper for symptoms which is ongoing currently down to 10 mg daily.  Joint pains are present most of the time she also feels symptoms of burning and numbness affecting both hands.  She has difficulty gripping tightly and has experienced dropping or losing grip of things unexpectedly. She denies any skin rashes,  reports chronic bruising on extensor surfaces is not new.  No falls.   Labs reviewed 02/2021 RF neg ESR 10   Review of Systems  Constitutional:  Positive for fatigue.  HENT:  Negative for mouth dryness.   Eyes:  Negative for dryness.  Respiratory:  Positive for shortness of breath.   Cardiovascular:  Positive for swelling in legs/feet.  Gastrointestinal:  Negative for constipation.  Endocrine: Positive for cold intolerance.  Genitourinary:  Negative for difficulty urinating.  Musculoskeletal:  Positive for joint pain, joint pain, joint swelling, muscle weakness, morning stiffness and muscle tenderness.  Skin:  Negative for rash.  Allergic/Immunologic: Negative for susceptible to infections.  Neurological:  Positive for numbness and weakness.  Hematological:  Positive for bruising/bleeding tendency.  Psychiatric/Behavioral:  Negative for sleep disturbance.    PMFS History:  Patient Active Problem List   Diagnosis Date Noted   Bilateral shoulder pain 04/18/2021   Bilateral hand swelling 03/29/2021   Bilateral knee pain 03/29/2021   Bilateral hand numbness 03/29/2021   Iron deficiency anemia 03/23/2021    Class: Diagnosis of   B12 deficiency anemia 03/23/2021    Class: Acute   Anemia in chronic kidney disease 03/20/2021    Class: Chronic   Primary cancer of upper inner quadrant of right female breast ([place]) 01/20/2019    Class: Diagnosis of   Osteopenia after menopause 12/19/2018    Class: Chronic   Euthyroid goiter  08/17/2017   Essential hypertension 08/16/2017   Gastroesophageal reflux disease without esophagitis 08/16/2017   HLD (hyperlipidemia) 08/16/2017   Stage 3 chronic kidney disease ([place]) 08/16/2017   Type 2 diabetes mellitus without complication, without long-term current use of insulin ([place]) 08/16/2017   Abnormal stress test 08/15/2017   Chest pain 08/15/2017    Past Medical History:  Diagnosis Date   Chronic kidney disease    Diabetes ([place])    Hypertension      Family History  Problem Relation Age of Onset   Throat cancer Mother    Heart disease Father    Diabetes Sister    Heart disease Sister    Kidney disease Sister    Healthy Son    Healthy Son    Past Surgical History:  Procedure Laterality Date   BACK SURGERY     x 2   BREAST BIOPSY Right    x3   Social History   Social History Narrative   Not on file   Immunization History  Administered Date(s) Administered   Moderna Covid-19 Vaccine Bivalent Booster 84yr & up 03/24/2021   PFIZER(Purple Top)SARS-COV-2 Vaccination 07/25/2019, 08/16/2019, 11/14/2020     Objective: Vital Signs: BP (!) 173/71 (BP Location: Left Arm, Patient Position: Sitting, Cuff Size: Normal)   Pulse 70   Resp 18   Ht 5' 4"  (1.626 m)   Wt 182 lb (82.6 kg)   BMI 31.24 kg/m    Physical Exam Musculoskeletal:     Right lower leg: No edema.     Left lower leg: No edema.  Skin:    General: Skin is warm and dry.     Findings: Bruising present. No rash.  Neurological:     Mental Status: She is alert.     Musculoskeletal Exam:  Bilateral shoulder pain with active abduction starting at about 30 degrees cannot raise overhead unaided, severe pain with left shoulder passive abduction overhead, right shoulder less severely painful, passive range of motion is intact bilaterally Elbows full ROM no tenderness or swelling Left hand diffusely swollen with decreased flexion ROM and grip strength, tenderness to pressure over MCP joints Knees full ROM no tenderness or swelling    Investigation: No additional findings.  Imaging: XR Hand 2 View Left  Result Date: 03/31/2021 X-ray left hand 2 views Radiocarpal joint space appears normal.  Few cystic changes present in the carpal bones.  M[facility].  MCP and PIP joint spaces appear normal.  Mild asymmetric narrowing at DIP joints.  Bone mineralization consistent with severe osteopenia. Impression Mild degenerative arthritis at  first [facility], generalized osteopenia could be seen in chronic inflammatory arthritis or from systemic disease  XR Hand 2 View Right  Result Date: 03/31/2021 X-ray right hand 2 views Radiocarpal joint space appears normal.  Multiple cystic changes present throughout the carpal bones.  Mild first CMC joint degenerative arthritis change.  MCP and PIP joint spaces appear normal.  DIP joint space narrowing most advanced at the second digit.  Bone mineralization showing severe osteopenia. Impression Degenerative [facility], generalized osteopenia and multiple cystic changes could also be seen with chronic crystalline arthropathy or systemic disease  XR KNEE 3 VIEW LEFT  Result Date: 03/31/2021 X-ray left knee 3 views Medial worse than lateral joint space narrowing without significant osteophytes.  Chondrocalcinosis present in both compartments.  Patellofemoral joint space appears normal.  No significant joint swelling seen. Impression Medial  worse than lateral compartment osteoarthritis and chondrocalcinosis is present  XR KNEE 3 VIEW RIGHT  Result Date: 03/31/2021 X-ray right knee 3 views Medial and lateral compartment joint spaces appear intact probable early lateral osteophyte formation.  Calcifications present at joint margins bilaterally and joint cartilage surfaces.  Patellofemoral space mildly narrowed on medial aspect with slight lateral displacement.  No significant joint swelling is visible. 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  AC joint space appears normal possible mild osteophytosis of distal clavicle.  No significant swelling or abnormal calcifications seen. Impression No significant arthritis or abnormalities seen  XR Shoulder Right  Result  Date: 04/19/2021 X-ray right shoulder 4 views Glenohumeral joint appears normal in all views.  No visualized calcifications or effusions.  Ac joint with some widening across superior surface, without osteophytosis or sclerosis. Impression No significant arthritis or abnromality seen consistent with her symptoms   Recent Labs: Lab Results  Component Value Date   WBC 8.9 03/20/2021   HGB 10.8 (A) 03/20/2021   PLT 188 03/20/2021   NA 139 04/18/2021   K 4.4 04/18/2021   CL 107 04/18/2021   CO2 21 04/18/2021   GLUCOSE 176 (H) 04/18/2021   BUN 30 (H) 04/18/2021   CREATININE 1.48 (H) 04/18/2021   ALKPHOS 87 03/20/2021   AST 24 03/20/2021   ALT 11 03/20/2021   PROT 6.2 03/20/2021   ALBUMIN 4.0 03/20/2021   CALCIUM 9.1 04/18/2021    Speciality Comments: No specialty comments available.  Procedures:  No procedures performed Allergies: Patient has no known allergies.   Assessment / Plan:     Visit Diagnoses: Bilateral hand swelling - Plan: C-reactive protein Bilateral hand numbness Acute pain of both shoulders - Plan: XR Shoulder Left, XR Shoulder Right, C-reactive protein  Markedly increased bilateral shoulder pain compared to our last visit cannot raise hands overhead and has severe pain with passive range of motion.  Bilateral shoulder x-rays checked do not show any significant arthritis that would account for the symptoms.  Clinically this is more suggestive for PMR type presentation. This can occur in association with ductal carcinoma in situ but development after mastectomy does not seem likely. The hand numbness is atypical for peripheral neuropathy but also no radiation suggesting shoulder or neck impingement right now.  We will recheck CRP level today in our office off the medication.  Stage 3b chronic kidney disease ([place]) - Plan: BASIC METABOLIC PANEL WITH GFR  Chronic kidney disease could be a limitation for trial of DMARD treatment could also contributed with crystalline  arthropathy although this seems less consistent.  We will repeat basic metabolic panel today.  Orders: Orders Placed This Encounter  Procedures   XR Shoulder Left   XR Shoulder Right   C-reactive protein   BASIC METABOLIC PANEL WITH GFR    No orders of the defined types were placed in this encounter.    Follow-Up Instructions: No follow-ups on file.   Collier Salina, MD  Note - This record has been created using [employer].  Chart creation errors have been sought, but may not always  have been located. Such creation errors do not reflect on  the standard of medical care.

## 2021-04-18 ENCOUNTER — Ambulatory Visit: Payer: Self-pay

## 2021-04-18 ENCOUNTER — Encounter: Payer: Self-pay | Admitting: Internal Medicine

## 2021-04-18 ENCOUNTER — Ambulatory Visit: Payer: Medicare HMO | Admitting: Internal Medicine

## 2021-04-18 ENCOUNTER — Other Ambulatory Visit: Payer: Self-pay

## 2021-04-18 VITALS — BP 173/71 | HR 70 | Resp 18 | Ht 64.0 in | Wt 182.0 lb

## 2021-04-18 DIAGNOSIS — M7989 Other specified soft tissue disorders: Secondary | ICD-10-CM

## 2021-04-18 DIAGNOSIS — M25512 Pain in left shoulder: Secondary | ICD-10-CM | POA: Diagnosis not present

## 2021-04-18 DIAGNOSIS — M25511 Pain in right shoulder: Secondary | ICD-10-CM

## 2021-04-18 DIAGNOSIS — R200 Anesthesia of skin: Secondary | ICD-10-CM | POA: Diagnosis not present

## 2021-04-18 DIAGNOSIS — E538 Deficiency of other specified B group vitamins: Secondary | ICD-10-CM | POA: Diagnosis not present

## 2021-04-18 DIAGNOSIS — Z23 Encounter for immunization: Secondary | ICD-10-CM | POA: Diagnosis not present

## 2021-04-18 DIAGNOSIS — N1832 Chronic kidney disease, stage 3b: Secondary | ICD-10-CM

## 2021-04-18 HISTORY — DX: Pain in right shoulder: M25.511

## 2021-04-19 LAB — BASIC METABOLIC PANEL WITH GFR
BUN/Creatinine Ratio: 20 (calc) (ref 6–22)
BUN: 30 mg/dL — ABNORMAL HIGH (ref 7–25)
CO2: 21 mmol/L (ref 20–32)
Calcium: 9.1 mg/dL (ref 8.6–10.4)
Chloride: 107 mmol/L (ref 98–110)
Creat: 1.48 mg/dL — ABNORMAL HIGH (ref 0.60–1.00)
Glucose, Bld: 176 mg/dL — ABNORMAL HIGH (ref 65–99)
Potassium: 4.4 mmol/L (ref 3.5–5.3)
Sodium: 139 mmol/L (ref 135–146)
eGFR: 36 mL/min/{1.73_m2} — ABNORMAL LOW (ref >=60)

## 2021-04-19 LAB — C-REACTIVE PROTEIN: CRP: 11.2 mg/L — ABNORMAL HIGH (ref <8.0)

## 2021-04-25 DIAGNOSIS — N1832 Chronic kidney disease, stage 3b: Secondary | ICD-10-CM | POA: Diagnosis not present

## 2021-04-25 DIAGNOSIS — E538 Deficiency of other specified B group vitamins: Secondary | ICD-10-CM | POA: Diagnosis not present

## 2021-04-27 ENCOUNTER — Telehealth: Payer: Self-pay

## 2021-04-27 DIAGNOSIS — M7989 Other specified soft tissue disorders: Secondary | ICD-10-CM

## 2021-04-27 DIAGNOSIS — M25511 Pain in right shoulder: Secondary | ICD-10-CM

## 2021-04-27 MED ORDER — PREDNISONE 10 MG PO TABS: ORAL_TABLET | ORAL | 0 refills | Status: DC

## 2021-04-27 NOTE — Telephone Encounter (Signed)
Patient called stating she had an appointment with Dr. Benjamine Mola last week and was told she would receive a call with the results of her labwork and x-rays.

## 2021-04-27 NOTE — Telephone Encounter (Signed)
Please schedule her for clinic follow up again in 4 weeks, possible PMR.  FYI- I spoke with Ms. Dower her xrays do not show significant arthritis her labs show CRP increased again to 11.2 after stopping the prednisone. Her kidney function numbers improved somewhat, still CKD stage 3 range. I recommended we resume prednisone at a moderate dose with slower tapering for now starting 20 mg daily then 15 mg daily after 2 weeks.

## 2021-04-28 NOTE — Telephone Encounter (Signed)
I LMOM for patient to call to schedule a 4 week follow up with Dr. Benjamine Mola.

## 2021-05-02 DIAGNOSIS — N261 Atrophy of kidney (terminal): Secondary | ICD-10-CM | POA: Diagnosis not present

## 2021-05-02 DIAGNOSIS — E538 Deficiency of other specified B group vitamins: Secondary | ICD-10-CM | POA: Diagnosis not present

## 2021-05-02 DIAGNOSIS — R809 Proteinuria, unspecified: Secondary | ICD-10-CM | POA: Diagnosis not present

## 2021-05-02 DIAGNOSIS — N1832 Chronic kidney disease, stage 3b: Secondary | ICD-10-CM | POA: Diagnosis not present

## 2021-05-02 DIAGNOSIS — I129 Hypertensive chronic kidney disease with stage 1 through stage 4 chronic kidney disease, or unspecified chronic kidney disease: Secondary | ICD-10-CM | POA: Diagnosis not present

## 2021-05-02 DIAGNOSIS — E1122 Type 2 diabetes mellitus with diabetic chronic kidney disease: Secondary | ICD-10-CM | POA: Diagnosis not present

## 2021-05-02 DIAGNOSIS — D631 Anemia in chronic kidney disease: Secondary | ICD-10-CM | POA: Diagnosis not present

## 2021-05-02 DIAGNOSIS — E872 Acidosis, unspecified: Secondary | ICD-10-CM | POA: Diagnosis not present

## 2021-05-09 DIAGNOSIS — E1121 Type 2 diabetes mellitus with diabetic nephropathy: Secondary | ICD-10-CM | POA: Diagnosis not present

## 2021-05-09 DIAGNOSIS — I129 Hypertensive chronic kidney disease with stage 1 through stage 4 chronic kidney disease, or unspecified chronic kidney disease: Secondary | ICD-10-CM | POA: Diagnosis not present

## 2021-05-09 DIAGNOSIS — E538 Deficiency of other specified B group vitamins: Secondary | ICD-10-CM | POA: Diagnosis not present

## 2021-05-09 DIAGNOSIS — C50919 Malignant neoplasm of unspecified site of unspecified female breast: Secondary | ICD-10-CM | POA: Diagnosis not present

## 2021-05-12 NOTE — Progress Notes (Signed)
[facility]  [address] [place],  [place]  [address] [phone]  Clinic Day:  05/19/2021  Referring physician: Marco Collie, MD  This document serves as a record of services personally performed by Hosie Poisson, MD. It was created on their behalf by Curry,Lauren E, a trained medical scribe. The creation of this record is based on the scribe's personal observations and the provider's statements to them.  CHIEF COMPLAINT:  CC: Stage IA hormone receptor positive right breast cancer  Current Treatment:  Surveillance   HISTORY OF PRESENT ILLNESS:  Amber [name] is a 75 y.o. 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  She was treated with a right simple mastectomy.  She had not been undergoing routine annual mammograms, then had a screening mammogram in June showing possible masses in both breasts.  Diagnostic mammogram which showed a 7 x 3 x 7 mm lobular hypoechoic mass in the right breast at the 10 o'clock position 10 cm from the nipple.  Additional bilateral oval circumscribed masses favored to represent cysts or complicated cysts were also seen.  She had a biopsy of the right breast in July, which revealed ductal carcinoma in situ partially involving a complex sclerosing lesion.  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  No atypia, in situ or invasive malignancy was identified.  She had an MRI was done in early August, which revealed a suspicious lesion in the upper inner quadrant of the right breast, as well as multiple small enhancing masses in the left breast consistent with benign lesions.  Axillary lymph nodes appeared normal.  She then had a final biopsy of the upper inner quadrant of the right breast in August.  Pathology revealed invasive ductal carcinoma, grade 1, and  ductal carcinoma in situ.  Estrogen and progesterone receptors were both positive at 100% and HER2 was negative.  Ki67 was 5%.  She underwent right simple mastectomy and right axillary sentinel lymph node biopsy in September.  Three sentinel lymph nodes were negative for metastasis.  Adjuvant chemotherapy and radiation therapy were not recommended.  Bone density scan in June 2020 revealed osteopenia with a T-score of -2.1 in the left femur neck and a T-score of -1.5 in the spine, for which she is on calcium and vitamin-D.  She was placed on anastrozole 1 mg daily in October 2020.  Left diagnostic mammogram and breast ultrasound in June revealed 3 small, benign left breast cysts, which do not need any further follow-up.  There is no evidence of malignancy in the left breast.  She has stage IV chronic kidney disease and is followed by a nephrologist, Dr. Royce Macadamia.   Annual mammogram from July 2022 was clear. Bone density from August revealed stable to improved osteopenia with a T-score of -2.0 of the left femur neck, previously -2.1.  Dual femur total mean measures -1.7, stable.  AP Spine measures -1.5, stable. She developed severe swelling and stiffness of both hands associated with erythema and inability to use her hands. We stopped the anastrozole but it did not seem to improve. She was given two short course of corticosteroids with limited benefit and then referred to a rheumatologist. ANA and RA were negative. She was found to have anemia with iron deficiency and B12 deficiency and is on supplements of both.  INTERVAL  HISTORY:  Amber [name] is here for routine follow up and states that her hands are doing better. She has been following with Dr. Benjamine Mola of rheumatology and is currently on a slowly tapering course of corticosteroids with prednisone. She most recently is down to 15 mg daily. She continues oral iron supplement and monthly B12 injections. She is no longer on oral potassium but includes potassium rich foods  in her diet. She remains off anastrozole. White count is elevated at 14.5, secondary to corticosteroids, hemoglobin has improved from 10.8 to 11.4, and platelets are normal. Chemistries are remarkable for a BUN of 34, a creatinine of 1.5, improved from 2.1, and a total protein of 5.6. Non-fasting blood glucose is 232. Her  appetite is good, and she has gained 2 and 1/2 pounds since her last visit.  She denies fever, chills or other signs of infection.  She denies nausea, vomiting, bowel issues, or abdominal pain.  She denies sore throat, cough, dyspnea, or chest pain.  REVIEW OF SYSTEMS:  Review of Systems  Constitutional: Negative.  Negative for appetite change, chills, fatigue, fever and unexpected weight change.  HENT:  Negative.    Eyes: Negative.   Respiratory: Negative.  Negative for chest tightness, cough, hemoptysis, shortness of breath and wheezing.   Cardiovascular: Negative.  Negative for chest pain, leg swelling and palpitations.  Gastrointestinal: Negative.  Negative for abdominal distention, abdominal pain, blood in stool, constipation, diarrhea, nausea and vomiting.  Endocrine: Negative.   Genitourinary: Negative.  Negative for difficulty urinating, dysuria, frequency and hematuria.   Musculoskeletal: Negative.  Negative for arthralgias, back pain, flank pain, gait problem and myalgias.       Swelling and erythema of the bilateral hands  Skin: Negative.   Neurological:  Positive for numbness (and paresthesias of the bilateral hands). Negative for dizziness, extremity weakness, gait problem, headaches, light-headedness, seizures and speech difficulty.  Hematological: Negative.   Psychiatric/Behavioral: Negative.  Negative for depression and sleep disturbance. The patient is not nervous/anxious.     VITALS:  Blood pressure (!) 148/82, pulse 65, temperature 98.3 F (36.8 C), temperature source Oral, resp. rate 18, height _0  (1.626 m), weight 182 lb 8 oz (82.8 kg), SpO2 97 %.  Wt  Readings from Last 3 Encounters:  05/19/21 182 lb 8 oz (82.8 kg)  04/18/21 182 lb (82.6 kg)  03/29/21 180 lb 3.2 oz (81.7 kg)    Body mass index is 31.33 kg/m.  Performance status (ECOG): 1 - Symptomatic but completely ambulatory  PHYSICAL EXAM:  Physical Exam Constitutional:      General: She is not in acute distress.    Appearance: Normal appearance. She is normal weight.  HENT:     Head: Normocephalic and atraumatic.  Eyes:     General: No scleral icterus.    Extraocular Movements: Extraocular movements intact.     Conjunctiva/sclera: Conjunctivae normal.     Pupils: Pupils are equal, round, and reactive to light.  Cardiovascular:     Rate and Rhythm: Normal rate and regular rhythm.     Pulses: Normal pulses.     Heart sounds: Normal heart sounds. No murmur heard.   No friction rub. No gallop.  Pulmonary:     Effort: Pulmonary effort is normal. No respiratory distress.     Breath sounds: Normal breath sounds.  Chest:     Comments: Fibrocystic changes throughout the left breast which are tender. Right mastectomy is negative. Abdominal:     General: Bowel sounds are normal. There is  no distension.     Palpations: Abdomen is soft. There is no hepatomegaly, splenomegaly or mass.     Tenderness: There is no abdominal tenderness.  Musculoskeletal:        General: Normal range of motion.     Right hand: Swelling (with palmar erythema) present.     Left hand: Swelling (with palmar erythema) present.     Cervical back: Normal range of motion and neck supple.     Right lower leg: No edema.     Left lower leg: No edema.     Comments: She still has joint swelling and stiffness but much better than previous.  Lymphadenopathy:     Cervical: No cervical adenopathy.  Skin:    General: Skin is warm and dry.  Neurological:     General: No focal deficit present.     Mental Status: She is alert and oriented to person, place, and time. Mental status is at baseline.  Psychiatric:         Mood and Affect: Mood normal.        Behavior: Behavior normal.        Thought Content: Thought content normal.        Judgment: Judgment normal.   LABS:   CBC Latest Ref Rng & Units 05/19/2021 03/20/2021  WBC - 14.5 8.9  Hemoglobin 12.0 - 16.0 11.4(A) 10.8(A)  Hematocrit 36 - 46 35(A) 33(A)  Platelets 150 - 399 146(A) 188   CMP Latest Ref Rng & Units 05/19/2021 04/18/2021 03/20/2021  Glucose 65 - 99 mg/dL - 176(H) -  BUN 4 - 21 34(A) 30(H) 31(A)  Creatinine 0.5 - 1.1 1.5(A) 1.48(H) 2.1(A)  Sodium 137 - 147 136(A) 139 137  Potassium 3.4 - 5.3 4.0 4.4 3.2(A)  Chloride 99 - 108 104 107 104  CO2 13 - 22 23(A) 21 22  Calcium 8.7 - 10.7 9.0 9.1 8.8  Total Protein g/dL - - 6.2  Alkaline Phos 25 - 125 88 - 87  AST 13 - 35 27 - 24  ALT 7 - 35 24 - 11     STUDIES:  No results found.   Allergies: No Known Allergies  Current Medications: Current Outpatient Medications  Medication Sig Dispense Refill   KLOR-CON M20 20 MEQ tablet TAKE 1 TABLET BY MOUTH EVERY DAY (Patient not taking: Reported on 04/18/2021) 30 tablet 1   amLODipine (NORVASC) 5 MG tablet      aspirin 81 MG EC tablet Take by mouth.     Cholecalciferol 125 MCG (5000 UT) capsule Take by mouth.     doxazosin (CARDURA) 2 MG tablet      Ferrous Gluconate (IRON) 240 (27 Fe) MG TABS Take by mouth.     fluticasone furoate-vilanterol (BREO ELLIPTA) 100-25 MCG/INH AEPB Inhale 1 puff into the lungs daily.     furosemide (LASIX) 40 MG tablet  (Patient not taking: No sig reported)     glimepiride (AMARYL) 2 MG tablet      hydrALAZINE (APRESOLINE) 100 MG tablet 3 (three) times daily.     metoprolol succinate (TOPROL-XL) 50 MG 24 hr tablet      omeprazole (PRILOSEC) 40 MG capsule      pioglitazone (ACTOS) 15 MG tablet      pravastatin (PRAVACHOL) 40 MG tablet      predniSONE (DELTASONE) 10 MG tablet Take 2 tablets (20 mg total) by mouth daily with breakfast for 14 days, THEN 1.5 tablets (15 mg total) daily with breakfast for  14  days. 49 tablet 0   traMADol (ULTRAM) 50 MG tablet Take 1 tablet (50 mg total) by mouth every 6 (six) hours as needed. (Patient not taking: Reported on 04/18/2021) 90 tablet 0   No current facility-administered medications for this visit.    ASSESSMENT & PLAN:   Assessment:   Stage IA hormone receptor positive breast cancer, diagnosed in July 2020.  She remains without evidence of recurrence.  Anastrozole remains on hold due to her bilateral hand changes.  We will discuss further hormonal treatment for her breast cancer after this is resolved.  2. Osteopenia, for which she takes calcium/vitamin D.  She will be due for repeat bone density scan in August 2024.  3. Severe pain, swelling and stiffness of the bilateral hands. Her symptoms have improved with corticosteroids. She will continue to follow with Dr. Benjamine Mola of rheumatology. I am still not sure whether the anastrazole was the cause so we are hesitant to restart this, especially with her very low risk for breast cancer recurrence.  4. Anemia, mild and responding to supplement.  She does continue oral iron supplement a couple times per week and B12 injections.    5. Renal insufficiency, improved. She continues to follow with her nephrologist, Dr. Harrie Jeans.  Plan: She will continue to follow with Dr. Benjamine Mola, rheumatologist, and continues prednisone 15 mg daily. For now, we will continue to hold anastrozole. As she is doing well from a breast cancer standpoint, we will plan to see her back in 5 months with CBC and CMP for repeat examination.  The patient understands the plans discussed today and is in agreement with them.  She knows to contact our office if she develops concerns regarding her breast cancer.   I provided 20 minutes of face-to-face time during this this encounter and > 50% was spent counseling as documented under my assessment and plan.    Derwood Kaplan, MD [facility] AT  [facility] [address] [place] [address] Dept: [phone] Dept Fax: [phone]   I, Rita Ohara, am acting as scribe for Derwood Kaplan, MD  I have reviewed this report as typed by the medical scribe, and it is complete and accurate.

## 2021-05-16 DIAGNOSIS — E538 Deficiency of other specified B group vitamins: Secondary | ICD-10-CM | POA: Diagnosis not present

## 2021-05-18 ENCOUNTER — Telehealth: Payer: Self-pay | Admitting: Oncology

## 2021-05-18 NOTE — Telephone Encounter (Signed)
Patient called to Confirm she will be here tomorrow

## 2021-05-19 ENCOUNTER — Encounter: Payer: Self-pay | Admitting: Oncology

## 2021-05-19 ENCOUNTER — Inpatient Hospital Stay: Payer: Medicare HMO

## 2021-05-19 ENCOUNTER — Other Ambulatory Visit: Payer: Self-pay

## 2021-05-19 ENCOUNTER — Inpatient Hospital Stay: Payer: Medicare HMO | Attending: Oncology | Admitting: Oncology

## 2021-05-19 ENCOUNTER — Other Ambulatory Visit: Payer: Self-pay | Admitting: Oncology

## 2021-05-19 VITALS — BP 148/82 | HR 65 | Temp 98.3°F | Resp 18 | Ht 64.0 in | Wt 182.5 lb

## 2021-05-19 DIAGNOSIS — C50211 Malignant neoplasm of upper-inner quadrant of right female breast: Secondary | ICD-10-CM | POA: Diagnosis not present

## 2021-05-19 DIAGNOSIS — D509 Iron deficiency anemia, unspecified: Secondary | ICD-10-CM

## 2021-05-19 DIAGNOSIS — D649 Anemia, unspecified: Secondary | ICD-10-CM | POA: Diagnosis not present

## 2021-05-19 DIAGNOSIS — N189 Chronic kidney disease, unspecified: Secondary | ICD-10-CM | POA: Diagnosis not present

## 2021-05-19 DIAGNOSIS — D631 Anemia in chronic kidney disease: Secondary | ICD-10-CM | POA: Diagnosis not present

## 2021-05-19 LAB — BASIC METABOLIC PANEL
BUN: 34 — AB (ref 4–21)
CO2: 23 — AB (ref 13–22)
Chloride: 104 (ref 99–108)
Creatinine: 1.5 — AB (ref 0.5–1.1)
Glucose: 232
Potassium: 4 (ref 3.4–5.3)
Sodium: 136 — AB (ref 137–147)

## 2021-05-19 LAB — CBC AND DIFFERENTIAL
HCT: 35 — AB (ref 36–46)
Hemoglobin: 11.4 — AB (ref 12.0–16.0)
Neutrophils Absolute: 12.91
Platelets: 146 — AB (ref 150–399)
WBC: 14.5

## 2021-05-19 LAB — HEPATIC FUNCTION PANEL
ALT: 24 (ref 7–35)
AST: 27 (ref 13–35)
Alkaline Phosphatase: 88 (ref 25–125)
Bilirubin, Total: 0.5

## 2021-05-19 LAB — CBC: RBC: 4.32 (ref 3.87–5.11)

## 2021-05-19 LAB — COMPREHENSIVE METABOLIC PANEL
Albumin: 3.8 (ref 3.5–5.0)
Calcium: 9 (ref 8.7–10.7)

## 2021-05-23 ENCOUNTER — Other Ambulatory Visit: Payer: Self-pay | Admitting: Internal Medicine

## 2021-05-25 ENCOUNTER — Telehealth: Payer: Self-pay

## 2021-05-25 NOTE — Telephone Encounter (Signed)
Office note faxed to Dr. Hodges office. 

## 2021-05-25 NOTE — Progress Notes (Signed)
Office Visit Note  Patient: Amber [name]             Date of Birth: Jan 16, 1946           MRN: [id number]             PCP: Marco Collie, MD Referring: Marco Collie, MD Visit Date: 05/26/2021   Subjective:   History of Present Illness: Amber [name] is a 75 y.o. female here for follow up for joint pains especially bilateral hand pain and swelling currently taking tapering prednisone treatment at 15 mg PO daily.  She is doing much better while remaining on the moderately high amount of prednisone.  She has not noticed any significant side effects.  She has easy bruising but describes this predating the steroid treatment.  She is discussed with her oncologist and is just remaining off the anastrozole at this time.  Previous HPI 04/18/21 Amber [name] is a 75 y.o. female here for follow up with hand swelling and pain substantially increased after our last visit after quitting the previous prednisone dose.   Previous HPI 03/29/21 Amber [name] is a 75 y.o. female with history of breast cancer and stage IV CKD here for joint pain and swelling involving bilateral hands. 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  Symptoms started about 2 to 3 months ago with pain and swelling initially in the hands then started developing increased pain and stiffness in the shoulders and then in the knees.  At the time also experienced gross hematuria and apparently clinic based labs concern for change in estimated GFR.  She saw her primary care clinic was treated with just a few days of oral antibiotics and steroids with symptom improvement.  Within 1 to 2 weeks she redeveloped the pain and swelling in multiple sites though her hematuria remains resolved.  She followed up with her primary care office and then with her oncologist and was placed back on a  longer prednisone taper if referred for evaluation.  The work-up was negative for rheumatoid factor or sedimentation rate elevation.  Anastrozole was stopped due to concern of contributing to this pain and swelling process. She was started on a prednisone taper for symptoms which is ongoing currently down to 10 mg daily.  Joint pains are present most of the time she also feels symptoms of burning and numbness affecting both hands.  She has difficulty gripping tightly and has experienced dropping or losing grip of things unexpectedly. She denies any skin rashes, reports chronic bruising on extensor surfaces is not new.  No falls.   Labs reviewed 02/2021 RF neg ESR 10   Review of Systems  Constitutional:  Positive for fatigue.  HENT:  Negative for mouth dryness.   Eyes:  Negative for dryness.  Respiratory:  Positive for shortness of breath.   Cardiovascular:  Positive for swelling in legs/feet.  Gastrointestinal:  Negative for constipation.  Genitourinary:  Negative for difficulty urinating.  Musculoskeletal:  Positive for joint swelling and morning stiffness.  Skin:  Negative for rash.  Allergic/Immunologic: Negative for susceptible to infections.  Neurological:  Positive for numbness and weakness.  Hematological:  Negative for bruising/bleeding tendency.  Psychiatric/Behavioral:  Negative for sleep disturbance.    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   Essential hypertension 08/16/2017   Gastroesophageal reflux disease without esophagitis 08/16/2017   HLD (hyperlipidemia) 08/16/2017   Stage 3 chronic kidney disease ([place]) 08/16/2017   Type 2 diabetes mellitus without complication, without long-term current use of insulin ([place]) 08/16/2017   Abnormal stress test 08/15/2017   Chest pain 08/15/2017    Past Medical History:  Diagnosis Date   Chronic kidney disease    Diabetes ([place])    Hypertension     Family History  Problem Relation Age of Onset   Throat cancer Mother    Heart disease Father    Diabetes Sister    Heart disease Sister    Kidney disease Sister    Healthy Son    Healthy Son    Past Surgical History:  Procedure Laterality Date   BACK SURGERY     x 2   BREAST BIOPSY Right    x3   Social History   Social History Narrative   Not on file   Immunization History  Administered Date(s) Administered   Moderna Covid-19 Vaccine Bivalent Booster 74yr & up 03/24/2021   PFIZER(Purple Top)SARS-COV-2 Vaccination 07/25/2019, 08/16/2019, 11/14/2020     Objective: Vital Signs: BP (!) 175/75 (BP Location: Left Arm, Patient Position: Sitting, Cuff Size: Large)   Pulse 64   Resp 13   Ht 5' 4"  (1.626 m)   Wt 183 lb 6.4 oz (83.2 kg)   BMI 31.48 kg/m    Physical Exam Cardiovascular:     Rate and Rhythm: Normal rate and regular rhythm.  Pulmonary:     Effort: Pulmonary effort is normal.     Breath sounds: Normal breath sounds.  Skin:    General: Skin is warm and dry.     Findings: Bruising present. No rash.  Neurological:     Mental Status: She is alert.  Psychiatric:        Mood and Affect: Mood normal.     Musculoskeletal Exam:  Shoulders full ROM no tenderness or swelling Elbows full ROM no tenderness or swelling Wrists full ROM, trace left wrist swelling compared to right without tenderness Fingers full ROM no tenderness or swelling Knees full ROM no tenderness or  swelling   Investigation: No additional findings.  Imaging: No results found.  Recent Labs: Lab Results  Component Value Date   WBC 14.5 05/19/2021   HGB 11.4 (A) 05/19/2021   PLT 146 (A) 05/19/2021   NA 136 (A) 05/19/2021   K 4.0 05/19/2021   CL 104 05/19/2021   CO2 23 (A) 05/19/2021   GLUCOSE 176 (H) 04/18/2021   BUN 34 (A) 05/19/2021   CREATININE 1.5 (A) 05/19/2021   ALKPHOS 88 05/19/2021   AST 27 05/19/2021   ALT 24 05/19/2021   PROT 6.2 03/20/2021   ALBUMIN 3.8 05/19/2021   CALCIUM 9.0 05/19/2021    Speciality Comments: No specialty comments available.  Procedures:  No procedures performed Allergies: Patient has no known allergies.   Assessment / Plan:     Visit Diagnoses: Bilateral hand swelling -  Plan: predniSONE (DELTASONE) 5 MG tablet  Swelling is almost gone left wrist very slight swelling no discoloration or particular tenderness. I discussed findings so far are nonspecific, medication related cause is somewhat unlikely based on the timing now she is off for months already. Sometimes a reactive process can last for up to 6-12 months. Otherwise could be a seronegative arthritis, treatment options somewhat limited by comorbidity especially CKD and previous cancer. For now recommend slow steroid tapering down to 10, 7.5 ,5, 2.5 mg doses monthly.  Acute pain of both shoulders  Shoulder are currently fine on exam and without complaint today.  Orders: No orders of the defined types were placed in this encounter.  Meds ordered this encounter  Medications   predniSONE (DELTASONE) 5 MG tablet    Sig: Take 2 tablets (10 mg total) by mouth daily with breakfast for 30 days, THEN 1.5 tablets (7.5 mg total) daily with breakfast for 30 days, THEN 1 tablet (5 mg total) daily with breakfast for 30 days, THEN 0.5 tablets (2.5 mg total) daily with breakfast.    Dispense:  75 tablet    Refill:  1      Follow-Up Instructions: Return in about 3 months (around 08/26/2021)  for ?Arthritis on GC taper f/u 68mo.   CCollier Salina MD  Note - This record has been created using [employer]  Chart creation errors have been sought, but may not always  have been located. Such creation errors do not reflect on  the standard of medical care.

## 2021-05-25 NOTE — Telephone Encounter (Signed)
-----   Message from Derwood Kaplan, MD sent at 05/25/2021  7:15 AM EST ----- Regarding: note Pls send her 11/11 note to Dr. Marco Collie

## 2021-05-26 ENCOUNTER — Encounter: Payer: Self-pay | Admitting: Internal Medicine

## 2021-05-26 ENCOUNTER — Ambulatory Visit: Payer: Medicare HMO | Admitting: Internal Medicine

## 2021-05-26 ENCOUNTER — Other Ambulatory Visit: Payer: Self-pay

## 2021-05-26 VITALS — BP 175/75 | HR 64 | Resp 13 | Ht 64.0 in | Wt 183.4 lb

## 2021-05-26 DIAGNOSIS — M25512 Pain in left shoulder: Secondary | ICD-10-CM

## 2021-05-26 DIAGNOSIS — M7989 Other specified soft tissue disorders: Secondary | ICD-10-CM

## 2021-05-26 DIAGNOSIS — M25511 Pain in right shoulder: Secondary | ICD-10-CM | POA: Diagnosis not present

## 2021-05-26 MED ORDER — PREDNISONE 5 MG PO TABS: ORAL_TABLET | ORAL | 1 refills | Status: DC

## 2021-06-08 DIAGNOSIS — I129 Hypertensive chronic kidney disease with stage 1 through stage 4 chronic kidney disease, or unspecified chronic kidney disease: Secondary | ICD-10-CM | POA: Diagnosis not present

## 2021-06-08 DIAGNOSIS — C50919 Malignant neoplasm of unspecified site of unspecified female breast: Secondary | ICD-10-CM | POA: Diagnosis not present

## 2021-06-08 DIAGNOSIS — E1121 Type 2 diabetes mellitus with diabetic nephropathy: Secondary | ICD-10-CM | POA: Diagnosis not present

## 2021-06-13 DIAGNOSIS — E538 Deficiency of other specified B group vitamins: Secondary | ICD-10-CM | POA: Diagnosis not present

## 2021-06-14 ENCOUNTER — Other Ambulatory Visit: Payer: Self-pay | Admitting: Oncology

## 2021-06-22 ENCOUNTER — Other Ambulatory Visit: Payer: Self-pay | Admitting: Internal Medicine

## 2021-07-09 DIAGNOSIS — E785 Hyperlipidemia, unspecified: Secondary | ICD-10-CM | POA: Diagnosis not present

## 2021-07-09 DIAGNOSIS — E1121 Type 2 diabetes mellitus with diabetic nephropathy: Secondary | ICD-10-CM | POA: Diagnosis not present

## 2021-07-09 DIAGNOSIS — J449 Chronic obstructive pulmonary disease, unspecified: Secondary | ICD-10-CM | POA: Diagnosis not present

## 2021-07-09 DIAGNOSIS — I25119 Atherosclerotic heart disease of native coronary artery with unspecified angina pectoris: Secondary | ICD-10-CM | POA: Diagnosis not present

## 2021-07-12 DIAGNOSIS — R059 Cough, unspecified: Secondary | ICD-10-CM | POA: Diagnosis not present

## 2021-07-12 DIAGNOSIS — Z20822 Contact with and (suspected) exposure to covid-19: Secondary | ICD-10-CM | POA: Diagnosis not present

## 2021-07-12 DIAGNOSIS — J029 Acute pharyngitis, unspecified: Secondary | ICD-10-CM | POA: Diagnosis not present

## 2021-07-12 DIAGNOSIS — E538 Deficiency of other specified B group vitamins: Secondary | ICD-10-CM | POA: Diagnosis not present

## 2021-07-12 DIAGNOSIS — J441 Chronic obstructive pulmonary disease with (acute) exacerbation: Secondary | ICD-10-CM | POA: Diagnosis not present

## 2021-08-09 DIAGNOSIS — I25119 Atherosclerotic heart disease of native coronary artery with unspecified angina pectoris: Secondary | ICD-10-CM | POA: Diagnosis not present

## 2021-08-09 DIAGNOSIS — E1121 Type 2 diabetes mellitus with diabetic nephropathy: Secondary | ICD-10-CM | POA: Diagnosis not present

## 2021-08-09 DIAGNOSIS — E785 Hyperlipidemia, unspecified: Secondary | ICD-10-CM | POA: Diagnosis not present

## 2021-08-09 DIAGNOSIS — J449 Chronic obstructive pulmonary disease, unspecified: Secondary | ICD-10-CM | POA: Diagnosis not present

## 2021-08-14 DIAGNOSIS — N1832 Chronic kidney disease, stage 3b: Secondary | ICD-10-CM | POA: Diagnosis not present

## 2021-08-18 ENCOUNTER — Ambulatory Visit: Payer: Medicare HMO | Admitting: Hematology and Oncology

## 2021-08-23 DIAGNOSIS — N1832 Chronic kidney disease, stage 3b: Secondary | ICD-10-CM | POA: Diagnosis not present

## 2021-08-23 DIAGNOSIS — E872 Acidosis, unspecified: Secondary | ICD-10-CM | POA: Diagnosis not present

## 2021-08-23 DIAGNOSIS — N261 Atrophy of kidney (terminal): Secondary | ICD-10-CM | POA: Diagnosis not present

## 2021-08-23 DIAGNOSIS — I129 Hypertensive chronic kidney disease with stage 1 through stage 4 chronic kidney disease, or unspecified chronic kidney disease: Secondary | ICD-10-CM | POA: Diagnosis not present

## 2021-08-23 DIAGNOSIS — E1122 Type 2 diabetes mellitus with diabetic chronic kidney disease: Secondary | ICD-10-CM | POA: Diagnosis not present

## 2021-08-23 DIAGNOSIS — R809 Proteinuria, unspecified: Secondary | ICD-10-CM | POA: Diagnosis not present

## 2021-08-23 DIAGNOSIS — D631 Anemia in chronic kidney disease: Secondary | ICD-10-CM | POA: Diagnosis not present

## 2021-08-23 DIAGNOSIS — E876 Hypokalemia: Secondary | ICD-10-CM | POA: Diagnosis not present

## 2021-08-25 ENCOUNTER — Ambulatory Visit: Payer: Medicare HMO | Admitting: Internal Medicine

## 2021-08-25 ENCOUNTER — Other Ambulatory Visit: Payer: Self-pay

## 2021-08-25 ENCOUNTER — Encounter: Payer: Self-pay | Admitting: Internal Medicine

## 2021-08-25 VITALS — BP 175/67 | HR 72 | Resp 16 | Ht 64.0 in | Wt 188.0 lb

## 2021-08-25 DIAGNOSIS — N1832 Chronic kidney disease, stage 3b: Secondary | ICD-10-CM

## 2021-08-25 DIAGNOSIS — M130 Polyarthritis, unspecified: Secondary | ICD-10-CM | POA: Insufficient documentation

## 2021-08-25 HISTORY — DX: Polyarthritis, unspecified: M13.0

## 2021-08-25 MED ORDER — HYDROXYCHLOROQUINE SULFATE 200 MG PO TABS: 200.0000 mg | ORAL_TABLET | Freq: Every day | ORAL | 0 refills | Status: AC

## 2021-08-25 NOTE — Progress Notes (Unsigned)
Office Visit Note  Patient: Amber [name]             Date of Birth: 1945/07/30           MRN: [id number]             PCP: Marco Collie, MD Referring: Marco Collie, MD Visit Date: 08/25/2021   Subjective:  Follow-up (Bil hand swelling, bil shoulder pain, bil feet and leg swelling, symptoms started again after finishing prednisone)   History of Present Illness: Amber [name] is a 76 y.o. female here for follow up for multiple joint pains with osteoarthritis and concern for reactive or seronegative arthritis also stopped anastrozole treatment. She is on prednisone plan to taper dose down from 15 mg to off during the past 3 months. Since stopping the prednisone she has had increased swelling return in her left hand and has increased edema in bilateral legs.  Previous HPI 05/26/21 Amber [name] is a 76 y.o. female here for follow up for joint pains especially bilateral hand pain and swelling currently taking tapering prednisone treatment at 15 mg PO daily.  She is doing much better while remaining on the moderately high amount of prednisone.  She has not noticed any significant side effects.  She has easy bruising but describes this predating the steroid treatment.  She is discussed with her oncologist and is just remaining off the anastrozole at this time.  Previous HPI 03/29/21 Amber [name] is a 76 y.o. female with history of breast cancer and stage IV CKD here for joint pain and swelling involving bilateral hands. 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  Symptoms started about 2 to 3 months ago with pain and swelling initially in the hands then started developing increased pain and stiffness in the shoulders and then in the knees.  At the time also experienced gross hematuria and apparently clinic based labs concern for change in  estimated GFR.  She saw her primary care clinic was treated with just a few days of oral antibiotics and steroids with symptom improvement.  Within 1 to 2 weeks she redeveloped the pain and swelling in multiple sites though her hematuria remains resolved.  She followed up with her primary care office and then with her oncologist and was placed back on a longer prednisone taper if referred for evaluation.  The work-up was negative for rheumatoid factor or sedimentation rate elevation.  Anastrozole was stopped due to concern of contributing to this pain and swelling process. She was started on a prednisone taper for symptoms which is ongoing currently down to 10 mg daily.  Joint pains are present most of the time she also feels symptoms of burning and numbness affecting both hands.  She has difficulty gripping tightly and has experienced dropping or losing grip of things unexpectedly. She denies any skin rashes, reports chronic bruising on extensor surfaces is not new.  No falls.   Review of Systems  Constitutional:  Positive for fatigue.  HENT:  Positive for mouth dryness.   Eyes:  Positive for dryness.  Respiratory:  Positive for shortness of breath.   Cardiovascular:  Positive for swelling in legs/feet.  Gastrointestinal:  Negative for constipation.  Endocrine: Positive for excessive thirst.  Genitourinary:  Negative for difficulty urinating.  Musculoskeletal:  Positive for joint pain, joint pain, joint swelling and morning stiffness.  Skin:  Negative for rash.  Allergic/Immunologic: Negative for susceptible to infections.  Neurological:  Positive for weakness.  Hematological:  Positive for bruising/bleeding tendency.  Psychiatric/Behavioral:  Negative for sleep disturbance.    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   Bilateral shoulder pain 04/18/2021   Bilateral hand swelling 03/29/2021   Bilateral knee pain 03/29/2021   Bilateral  hand numbness 03/29/2021   Iron deficiency anemia 03/23/2021    Class: Diagnosis of   B12 deficiency anemia 03/23/2021    Class: Acute   Anemia in chronic kidney disease 03/20/2021    Class: Chronic   Primary cancer of upper inner quadrant of right female breast ([place]) 01/20/2019    Class: Diagnosis of   Osteopenia after menopause 12/19/2018    Class: Chronic   Euthyroid goiter 08/17/2017   Essential hypertension 08/16/2017   Gastroesophageal reflux disease without esophagitis 08/16/2017   HLD (hyperlipidemia) 08/16/2017   Stage 3 chronic kidney disease ([place]) 08/16/2017   Type 2 diabetes mellitus without complication, without long-term current use of insulin ([place]) 08/16/2017   Abnormal stress test 08/15/2017   Chest pain 08/15/2017    Past Medical History:  Diagnosis Date   Chronic kidney disease    Diabetes ([place])    Hypertension     Family History  Problem Relation Age of Onset   Throat cancer Mother    Heart disease Father    Diabetes Sister    Heart disease Sister    Kidney disease Sister    Healthy Son    Healthy Son    Past Surgical History:  Procedure Laterality Date   BACK SURGERY     x 2   BREAST BIOPSY Right    x3   Social History   Social History Narrative   Not on file   Immunization History  Administered Date(s) Administered   Moderna Covid-19 Vaccine Bivalent Booster 60yrs & up 03/24/2021   PFIZER(Purple Top)SARS-COV-2 Vaccination 07/25/2019, 08/16/2019, 11/14/2020     Objective: Vital Signs: BP (!) 175/67 (BP Location: Left Arm, Patient Position: Sitting, Cuff Size: Normal)    Pulse 72    Resp 16    Ht 5\' 4"  (1.626 m)    Wt 188 lb (85.3 kg)    BMI 32.27 kg/m    Physical Exam Skin:    General: Skin is warm and dry.     Comments: Scattered bruising present worst on forearm extensor surfaces  Neurological:     Mental Status: She is alert.     Musculoskeletal Exam:  Shoulders full ROM no tenderness or swelling Elbows full ROM no tenderness  or swelling Generalized swelling in left hand from wrist to MCPs with tenderness, slightly decreased ROM present Knees full ROM no tenderness or swelling   Investigation: No additional findings.  Imaging: No results found.  Recent Labs: Lab Results  Component Value Date   WBC 14.5 05/19/2021   HGB 11.4 (A) 05/19/2021   PLT 146 (A) 05/19/2021   NA 136 (A) 05/19/2021   K 4.0 05/19/2021   CL 104 05/19/2021   CO2 23 (A) 05/19/2021   GLUCOSE 176 (H) 04/18/2021   BUN 34 (A) 05/19/2021   CREATININE 1.5 (A) 05/19/2021   ALKPHOS 88 05/19/2021   AST 27 05/19/2021   ALT 24 05/19/2021   PROT 6.2 03/20/2021   ALBUMIN 3.8 05/19/2021   CALCIUM 9.0 05/19/2021    Speciality Comments: No specialty comments available.  Procedures:  No procedures performed Allergies: Patient has no  known allergies.   Assessment / Plan:     Visit Diagnoses: Polyarthritis - Plan: C-reactive protein, hydroxychloroquine (PLAQUENIL) 200 MG tablet  Stage 3b chronic kidney disease (HCC)  ***  Orders: Orders Placed This Encounter  Procedures   C-reactive protein   Meds ordered this encounter  Medications   hydroxychloroquine (PLAQUENIL) 200 MG tablet    Sig: Take 1 tablet (200 mg total) by mouth daily.    Dispense:  90 tablet    Refill:  0     Follow-Up Instructions: Return in about 3 months (around 11/22/2021) for ?PMR/?RA HCQ start f/u 32mos.   Collier Salina, MD  Note - This record has been created using [employer].  Chart creation errors have been sought, but may not always  have been located. Such creation errors do not reflect on  the standard of medical care.

## 2021-08-25 NOTE — Patient Instructions (Signed)

## 2021-08-26 LAB — C-REACTIVE PROTEIN: CRP: 19.9 mg/L — ABNORMAL HIGH (ref <8.0)

## 2021-09-04 DIAGNOSIS — N1832 Chronic kidney disease, stage 3b: Secondary | ICD-10-CM | POA: Diagnosis not present

## 2021-09-06 DIAGNOSIS — J449 Chronic obstructive pulmonary disease, unspecified: Secondary | ICD-10-CM | POA: Diagnosis not present

## 2021-09-06 DIAGNOSIS — I25119 Atherosclerotic heart disease of native coronary artery with unspecified angina pectoris: Secondary | ICD-10-CM | POA: Diagnosis not present

## 2021-09-06 DIAGNOSIS — E785 Hyperlipidemia, unspecified: Secondary | ICD-10-CM | POA: Diagnosis not present

## 2021-09-06 DIAGNOSIS — E1121 Type 2 diabetes mellitus with diabetic nephropathy: Secondary | ICD-10-CM | POA: Diagnosis not present

## 2021-09-08 DIAGNOSIS — E785 Hyperlipidemia, unspecified: Secondary | ICD-10-CM | POA: Diagnosis not present

## 2021-09-08 DIAGNOSIS — E1122 Type 2 diabetes mellitus with diabetic chronic kidney disease: Secondary | ICD-10-CM | POA: Diagnosis not present

## 2021-09-18 ENCOUNTER — Telehealth: Payer: Self-pay

## 2021-09-18 DIAGNOSIS — C50919 Malignant neoplasm of unspecified site of unspecified female breast: Secondary | ICD-10-CM | POA: Diagnosis not present

## 2021-09-18 DIAGNOSIS — E1121 Type 2 diabetes mellitus with diabetic nephropathy: Secondary | ICD-10-CM | POA: Diagnosis not present

## 2021-09-18 DIAGNOSIS — E1122 Type 2 diabetes mellitus with diabetic chronic kidney disease: Secondary | ICD-10-CM | POA: Diagnosis not present

## 2021-09-18 DIAGNOSIS — J449 Chronic obstructive pulmonary disease, unspecified: Secondary | ICD-10-CM | POA: Diagnosis not present

## 2021-09-18 DIAGNOSIS — I700 Atherosclerosis of aorta: Secondary | ICD-10-CM | POA: Diagnosis not present

## 2021-09-18 DIAGNOSIS — I25119 Atherosclerotic heart disease of native coronary artery with unspecified angina pectoris: Secondary | ICD-10-CM | POA: Diagnosis not present

## 2021-09-18 NOTE — Telephone Encounter (Signed)
Patient left a voicemail stating she has a question for Dr. Benjamine Mola and requested a return call.   ?

## 2021-09-19 NOTE — Telephone Encounter (Signed)
I recommend she stop taking the medication if it is causing side effects. These should improve or go away within a week if it is related to the medication. If it does get better after stopping she should not resume the medicine we will have to discuss alternative options.

## 2021-09-19 NOTE — Telephone Encounter (Signed)
Patient advised Dr. Benjamine Mola recommends she stop taking the medication if it is causing side effects. These should improve or go away within a week if it is related to the medication. If it does get better after stopping she should not resume the medicine we will have to discuss alternative options. Patient will contact the office if symptoms resolve.  ?

## 2021-10-04 ENCOUNTER — Telehealth: Payer: Self-pay

## 2021-10-04 NOTE — Telephone Encounter (Signed)
Patient called stating Dr. Benjamine Mola had her discontinue her medication due to nausea.  Patient states the nausea is gone and requested a return call to let her know if there is another medication he recommends. ?

## 2021-10-07 DIAGNOSIS — E785 Hyperlipidemia, unspecified: Secondary | ICD-10-CM | POA: Diagnosis not present

## 2021-10-07 DIAGNOSIS — E1121 Type 2 diabetes mellitus with diabetic nephropathy: Secondary | ICD-10-CM | POA: Diagnosis not present

## 2021-10-17 ENCOUNTER — Inpatient Hospital Stay: Payer: Medicare HMO | Attending: Hematology and Oncology | Admitting: Hematology and Oncology

## 2021-10-17 ENCOUNTER — Inpatient Hospital Stay: Payer: Medicare HMO

## 2021-10-17 ENCOUNTER — Encounter: Payer: Self-pay | Admitting: Hematology and Oncology

## 2021-10-17 DIAGNOSIS — M8580 Other specified disorders of bone density and structure, unspecified site: Secondary | ICD-10-CM

## 2021-10-17 DIAGNOSIS — D509 Iron deficiency anemia, unspecified: Secondary | ICD-10-CM | POA: Diagnosis not present

## 2021-10-17 DIAGNOSIS — M7989 Other specified soft tissue disorders: Secondary | ICD-10-CM | POA: Diagnosis not present

## 2021-10-17 DIAGNOSIS — C50211 Malignant neoplasm of upper-inner quadrant of right female breast: Secondary | ICD-10-CM

## 2021-10-17 DIAGNOSIS — Z780 Asymptomatic menopausal state: Secondary | ICD-10-CM

## 2021-10-17 LAB — CBC AND DIFFERENTIAL
HCT: 38 (ref 36–46)
Hemoglobin: 12.3 (ref 12.0–16.0)
Neutrophils Absolute: 7.62
Platelets: 197 10*3/uL (ref 150–400)
WBC: 9.9

## 2021-10-17 LAB — BASIC METABOLIC PANEL
BUN: 32 — AB (ref 4–21)
CO2: 21 (ref 13–22)
Chloride: 106 (ref 99–108)
Creatinine: 1.8 — AB (ref 0.5–1.1)
Glucose: 114
Potassium: 3.7 mEq/L (ref 3.5–5.1)
Sodium: 142 (ref 137–147)

## 2021-10-17 LAB — COMPREHENSIVE METABOLIC PANEL
Albumin: 4.5 (ref 3.5–5.0)
Calcium: 9 (ref 8.7–10.7)

## 2021-10-17 LAB — HEPATIC FUNCTION PANEL
ALT: 14 U/L (ref 7–35)
AST: 21 (ref 13–35)
Alkaline Phosphatase: 81 (ref 25–125)
Bilirubin, Total: 0.5

## 2021-10-17 LAB — CBC: RBC: 4.78 (ref 3.87–5.11)

## 2021-10-17 NOTE — Assessment & Plan Note (Signed)
Stage IA hormone receptor positive breast cancer, diagnosed in July 2020.  She remains without evidence of recurrence.  Anastrozole remains on hold due to her bilateral hand changes.  We will discuss further hormonal treatment for her breast cancer after this is resolved. ?

## 2021-10-17 NOTE — Assessment & Plan Note (Signed)
Severe pain, swelling and stiffness of the bilateral hands. She is being managed by rheumatology without much improvement. She was started on Plaquenil, but can not take due to nausea.  ?

## 2021-10-17 NOTE — Assessment & Plan Note (Signed)
Osteopenia, for which she takes calcium/vitamin D.  She will be due for repeat bone density scan in August 2024. ?? ?

## 2021-10-17 NOTE — Progress Notes (Signed)
?Patient Care Team: ?Marco Collie, MD as PCP - General (Family Medicine) ?Derwood Kaplan, MD as Consulting Physician (Oncology) ?Claudia Desanctis, MD as Consulting Physician (Nephrology) ?Collier Salina, MD as Consulting Physician (Rheumatology) ? ?Clinic Day:  10/17/2021 ? ?Referring physician: Marco Collie, MD ? ?ASSESSMENT & PLAN:  ? ?Assessment & Plan: ?Primary cancer of upper inner quadrant of right female breast ([place]) ?Stage IA hormone receptor positive breast cancer, diagnosed in July 2020.  She remains without evidence of recurrence.  Anastrozole remains on hold due to her bilateral hand changes.  We will discuss further hormonal treatment for her breast cancer after this is resolved. ? ?Osteopenia after menopause ?Osteopenia, for which she takes calcium/vitamin D.  She will be due for repeat bone density scan in August 2024. ?  ? ?Bilateral hand swelling ?Severe pain, swelling and stiffness of the bilateral hands. She is being managed by rheumatology without much improvement. She was started on Plaquenil, but can not take due to nausea.  ?  ? ?The patient understands the plans discussed today and is in agreement with them.  She knows to contact our office if she develops concerns prior to her next appointment. ? ? ? ?Melodye Ped, NP  ?[address] [address] ?Dept: [phone] ?Dept Fax: [phone]  ? ?No orders of the defined types were placed in this encounter. ?  ? ? ?CHIEF COMPLAINT:  ?CC: A 76 year old female with history of breast cancer here for 5 month evaluation ? ?Current Treatment:  Surveillance ? ?INTERVAL HISTORY:  ?Haelie is here today for repeat clinical assessment. She denies fevers or chills. She denies pain. Her appetite is good. Her weight has been stable. ? ?I have reviewed the past medical history, past surgical history, social history and family history with the patient and  they are unchanged from previous note. ? ?ALLERGIES:  has No Known Allergies. ? ?MEDICATIONS:  ?Current Outpatient Medications  ?Medication Sig Dispense Refill  ? amLODipine (NORVASC) 5 MG tablet     ? anastrozole (ARIMIDEX) 1 MG tablet TAKE 1 TABLET EVERY DAY (Patient not taking: Reported on 08/25/2021) 90 tablet 3  ? aspirin 81 MG EC tablet Take by mouth.    ? Cholecalciferol 125 MCG (5000 UT) capsule Take by mouth.    ? doxazosin (CARDURA) 2 MG tablet     ? Ferrous Gluconate (IRON) 240 (27 Fe) MG TABS Take by mouth.    ? fluticasone furoate-vilanterol (BREO ELLIPTA) 100-25 MCG/INH AEPB Inhale 1 puff into the lungs daily.    ? furosemide (LASIX) 40 MG tablet     ? glimepiride (AMARYL) 2 MG tablet     ? hydrALAZINE (APRESOLINE) 100 MG tablet 3 (three) times daily.    ? hydroxychloroquine (PLAQUENIL) 200 MG tablet Take 1 tablet (200 mg total) by mouth daily. 90 tablet 0  ? metoprolol succinate (TOPROL-XL) 50 MG 24 hr tablet     ? omeprazole (PRILOSEC) 40 MG capsule     ? pioglitazone (ACTOS) 15 MG tablet     ? potassium chloride (KLOR-CON) 10 MEQ tablet Take by mouth.    ? pravastatin (PRAVACHOL) 40 MG tablet     ? traMADol (ULTRAM) 50 MG tablet Take 1 tablet (50 mg total) by mouth every 6 (six) hours as needed. (Patient not taking: Reported on 04/18/2021) 90 tablet 0  ? ?No current facility-administered medications for this visit.  ? ? ?HISTORY OF PRESENT  ILLNESS:  ? ?Oncology History  ?Primary cancer of upper inner quadrant of right female breast ([place])  ?01/20/2019 Initial Diagnosis  ? Primary cancer of upper inner quadrant of right female breast ([place]) ?  ?01/20/2019 Cancer Staging  ? Staging form: Breast, AJCC 8th Edition ?- Clinical stage from 01/20/2019: Stage IA (cT1a, cN0(sn), cM0, G1, ER+, PR+, HER2-) - Signed by Derwood Kaplan, MD on 02/19/2021 ?Histopathologic type: Infiltrating duct carcinoma, NOS ?Stage prefix: Initial diagnosis ?Method of lymph node assessment: Sentinel lymph node biopsy ?Nuclear  grade: G1 ?Histologic grading system: 3 grade system ?Menopausal status: Postmenopausal ?Circulating tumor cells (CTC): Not assessed ?Ki-67 (%): 5 ?Stage used in treatment planning: Yes ?National guidelines used in treatment planning: Yes ?Type of national guideline used in treatment planning: NCCN ?Staging comments: 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 ?  ?  ? ? ?REVIEW OF SYSTEMS:  ? ?Constitutional: Denies fevers, chills or abnormal weight loss ?Eyes: Denies blurriness of vision ?Ears, nose, mouth, throat, and face: Denies mucositis or sore throat ?Respiratory: Denies cough, dyspnea or wheezes ?Cardiovascular: Denies palpitation, chest discomfort or lower extremity swelling ?Gastrointestinal:  Denies nausea, heartburn or change in bowel habits ?Skin: Denies abnormal skin rashes ?Lymphatics: Denies new lymphadenopathy or easy bruising ?Neurological:Denies numbness, tingling or new weaknesses ?Behavioral/Psych: Mood is stable, no new changes  ?All other systems were reviewed with the patient and are negative. ? ? ?VITALS:  ?Blood pressure (!) 178/78, pulse (!) 56, temperature 98.3 ?F (36.8 ?C), temperature source Oral, resp. rate 18, height 5' 4"  (1.626 m), weight 188 lb (85.3 kg), SpO2 96 %.  ?Wt Readings from Last 3 Encounters:  ?10/17/21 188 lb (85.3 kg)  ?08/25/21 188 lb (85.3 kg)  ?05/26/21 183 lb 6.4 oz (83.2 kg)  ?  ?Body mass index is 32.27 kg/m?. ? ?Performance status (ECOG): 1 - Symptomatic but completely ambulatory ? ?PHYSICAL EXAM:  ? ?GENERAL:alert, no distress and comfortable ?SKIN: skin color, texture, turgor are normal, no rashes or significant lesions ?EYES: normal, Conjunctiva are pink and non-injected, sclera clear ?OROPHARYNX:no exudate, no erythema and lips, buccal mucosa, and tongue normal  ?NECK: supple, thyroid normal size, non-tender, without nodularity ?LYMPH:  no palpable lymphadenopathy in the cervical, axillary or inguinal ?LUNGS: clear to auscultation and  percussion with normal breathing effort ?HEART: regular rate & rhythm and no murmurs and no lower extremity edema ?ABDOMEN:abdomen soft, non-tender and normal bowel sounds ?Musculoskeletal:no cyanosis of digits and no clubbing  ?NEURO: alert & oriented x 3 with fluent speech, no focal motor/sensory deficits ? ?LABORATORY DATA:  ?I have reviewed the data as listed ?   ?Component Value Date/Time  ? NA 142 10/17/2021 0000  ? K 3.7 10/17/2021 0000  ? CL 106 10/17/2021 0000  ? CO2 21 10/17/2021 0000  ? GLUCOSE 176 (H) 04/18/2021 1521  ? BUN 32 (A) 10/17/2021 0000  ? CREATININE 1.8 (A) 10/17/2021 0000  ? CREATININE 1.48 (H) 04/18/2021 1521  ? CALCIUM 9.0 10/17/2021 0000  ? PROT 6.2 03/20/2021 0000  ? ALBUMIN 4.5 10/17/2021 0000  ? AST 21 10/17/2021 0000  ? ALT 14 10/17/2021 0000  ? ALKPHOS 81 10/17/2021 0000  ? ? ?No results found for: SPEP, UPEP ? ?Lab Results  ?Component Value Date  ? WBC 9.9 10/17/2021  ? NEUTROABS 7.62 10/17/2021  ? HGB 12.3 10/17/2021  ? HCT 38 10/17/2021  ? MCV 80 (A) 03/20/2021  ? PLT 197 10/17/2021  ? ? ?  Chemistry   ?   ?  Component Value Date/Time  ? NA 142 10/17/2021 0000  ? K 3.7 10/17/2021 0000  ? CL 106 10/17/2021 0000  ? CO2 21 10/17/2021 0000  ? BUN 32 (A) 10/17/2021 0000  ? CREATININE 1.8 (A) 10/17/2021 0000  ? CREATININE 1.48 (H) 04/18/2021 1521  ? GLU 114 10/17/2021 0000  ?    ?Component Value Date/Time  ? CALCIUM 9.0 10/17/2021 0000  ? ALKPHOS 81 10/17/2021 0000  ? AST 21 10/17/2021 0000  ? ALT 14 10/17/2021 0000  ?  ? ? ? ?RADIOGRAPHIC STUDIES: ?I have personally reviewed the radiological images as listed and agreed with the findings in the report. ?No results found. ?

## 2021-11-06 DIAGNOSIS — E1121 Type 2 diabetes mellitus with diabetic nephropathy: Secondary | ICD-10-CM | POA: Diagnosis not present

## 2021-11-06 DIAGNOSIS — E785 Hyperlipidemia, unspecified: Secondary | ICD-10-CM | POA: Diagnosis not present

## 2021-11-13 DIAGNOSIS — N1832 Chronic kidney disease, stage 3b: Secondary | ICD-10-CM | POA: Diagnosis not present

## 2021-11-13 NOTE — Progress Notes (Deleted)
Office Visit Note  Patient: Amber [name]             Date of Birth: Nov 21, 1945           MRN: [id number]             PCP: Marco Collie, MD Referring: Marco Collie, MD Visit Date: 11/23/2021   Subjective:  No chief complaint on file.   History of Present Illness: Amber [name] is a 76 y.o. female here for follow up for multiple joint pains with osteoarthritis and concern for reactive or seronegative arthritis also stopped anastrozole treatment.    Previous HPI 08/25/2021  Amber [name] is a 76 y.o. female here for follow up for multiple joint pains with osteoarthritis and concern for reactive or seronegative arthritis also stopped anastrozole treatment. She is on prednisone plan to taper dose down from 15 mg to off during the past 3 months. Since stopping the prednisone she has had increased swelling return in her left hand and has increased edema in bilateral legs.   Previous HPI 05/26/21 Amber [name] is a 76 y.o. female here for follow up for joint pains especially bilateral hand pain and swelling currently taking tapering prednisone treatment at 15 mg PO daily.  She is doing much better while remaining on the moderately high amount of prednisone.  She has not noticed any significant side effects.  She has easy bruising but describes this predating the steroid treatment.  She is discussed with her oncologist and is just remaining off the anastrozole at this time.   Previous HPI 03/29/21 Amber [name] is a 76 y.o. female with history of breast cancer and stage IV CKD here for joint pain and swelling involving bilateral hands. 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  Symptoms started about 2 to 3 months ago with pain and swelling initially in the hands then started developing increased pain and stiffness in the  shoulders and then in the knees.  At the time also experienced gross hematuria and apparently clinic based labs concern for change in estimated GFR.  She saw her primary care clinic was treated with just a few days of oral antibiotics and steroids with symptom improvement.  Within 1 to 2 weeks she redeveloped the pain and swelling in multiple sites though her hematuria remains resolved.  She followed up with her primary care office and then with her oncologist and was placed back on a longer prednisone taper if referred for evaluation.  The work-up was negative for rheumatoid factor or sedimentation rate elevation.  Anastrozole was stopped due to concern of contributing to this pain and swelling process. She was started on a prednisone taper for symptoms which is ongoing currently down to 10 mg daily.  Joint pains are present most of the time she also feels symptoms of burning and numbness affecting both hands.  She has difficulty gripping tightly and has experienced dropping or losing grip of things unexpectedly. She denies any skin rashes, reports chronic bruising on extensor surfaces is not new.  No falls.     No Rheumatology ROS completed.   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   Bilateral shoulder pain 04/18/2021   Bilateral hand swelling 03/29/2021   Bilateral knee pain 03/29/2021   Bilateral hand numbness 03/29/2021   Iron deficiency anemia 03/23/2021  Class: Diagnosis of   B12 deficiency anemia 03/23/2021    Class: Acute   Anemia in chronic kidney disease 03/20/2021    Class: Chronic   Primary cancer of upper inner quadrant of right female breast ([place]) 01/20/2019    Class: Diagnosis of   Osteopenia after menopause 12/19/2018    Class: Chronic   Euthyroid goiter 08/17/2017   Essential hypertension 08/16/2017   Gastroesophageal reflux disease without esophagitis 08/16/2017   HLD (hyperlipidemia) 08/16/2017   Stage 3 chronic  kidney disease ([place]) 08/16/2017   Type 2 diabetes mellitus without complication, without long-term current use of insulin ([place]) 08/16/2017   Abnormal stress test 08/15/2017   Chest pain 08/15/2017    Past Medical History:  Diagnosis Date   Chronic kidney disease    Diabetes ([place])    Hypertension     Family History  Problem Relation Age of Onset   Throat cancer Mother    Heart disease Father    Diabetes Sister    Heart disease Sister    Kidney disease Sister    Healthy Son    Healthy Son    Past Surgical History:  Procedure Laterality Date   BACK SURGERY     x 2   BREAST BIOPSY Right    x3   Social History   Social History Narrative   Not on file   Immunization History  Administered Date(s) Administered   Moderna Covid-19 Vaccine Bivalent Booster 44yr & up 03/24/2021   PFIZER(Purple Top)SARS-COV-2 Vaccination 07/25/2019, 08/16/2019, 11/14/2020     Objective: Vital Signs: There were no vitals taken for this visit.   Physical Exam   Musculoskeletal Exam: ***  CDAI Exam: CDAI Score: -- Patient Global: --; Provider Global: -- Swollen: --; Tender: -- Joint Exam 11/23/2021   No joint exam has been documented for this visit   There is currently no information documented on the homunculus. Go to the Rheumatology activity and complete the homunculus joint exam.  Investigation: No additional findings.  Imaging: No results found.  Recent Labs: Lab Results  Component Value Date   WBC 9.9 10/17/2021   HGB 12.3 10/17/2021   PLT 197 10/17/2021   NA 142 10/17/2021   K 3.7 10/17/2021   CL 106 10/17/2021   CO2 21 10/17/2021   GLUCOSE 176 (H) 04/18/2021   BUN 32 (A) 10/17/2021   CREATININE 1.8 (A) 10/17/2021   ALKPHOS 81 10/17/2021   AST 21 10/17/2021   ALT 14 10/17/2021   PROT 6.2 03/20/2021   ALBUMIN 4.5 10/17/2021   CALCIUM 9.0 10/17/2021    Speciality Comments: No specialty comments available.  Procedures:  No procedures performed Allergies:  Patient has no known allergies.   Assessment / Plan:     Visit Diagnoses: No diagnosis found.  ***  Orders: No orders of the defined types were placed in this encounter.  No orders of the defined types were placed in this encounter.    Follow-Up Instructions: No follow-ups on file.   MEarnestine Mealing CMA  Note - This record has been created using [employer]  Chart creation errors have been sought, but may not always  have been located. Such creation errors do not reflect on  the standard of medical care.

## 2021-11-23 ENCOUNTER — Ambulatory Visit: Payer: Medicare HMO | Admitting: Internal Medicine

## 2021-11-23 DIAGNOSIS — R809 Proteinuria, unspecified: Secondary | ICD-10-CM | POA: Diagnosis not present

## 2021-11-23 DIAGNOSIS — D631 Anemia in chronic kidney disease: Secondary | ICD-10-CM | POA: Diagnosis not present

## 2021-11-23 DIAGNOSIS — N261 Atrophy of kidney (terminal): Secondary | ICD-10-CM | POA: Diagnosis not present

## 2021-11-23 DIAGNOSIS — E876 Hypokalemia: Secondary | ICD-10-CM | POA: Diagnosis not present

## 2021-11-23 DIAGNOSIS — M130 Polyarthritis, unspecified: Secondary | ICD-10-CM

## 2021-11-23 DIAGNOSIS — E872 Acidosis, unspecified: Secondary | ICD-10-CM | POA: Diagnosis not present

## 2021-11-23 DIAGNOSIS — I129 Hypertensive chronic kidney disease with stage 1 through stage 4 chronic kidney disease, or unspecified chronic kidney disease: Secondary | ICD-10-CM | POA: Diagnosis not present

## 2021-11-23 DIAGNOSIS — N184 Chronic kidney disease, stage 4 (severe): Secondary | ICD-10-CM | POA: Diagnosis not present

## 2021-11-23 DIAGNOSIS — N1832 Chronic kidney disease, stage 3b: Secondary | ICD-10-CM

## 2021-11-23 DIAGNOSIS — E1122 Type 2 diabetes mellitus with diabetic chronic kidney disease: Secondary | ICD-10-CM | POA: Diagnosis not present

## 2021-11-29 ENCOUNTER — Other Ambulatory Visit: Payer: Self-pay | Admitting: Internal Medicine

## 2021-11-29 DIAGNOSIS — M130 Polyarthritis, unspecified: Secondary | ICD-10-CM

## 2021-11-29 NOTE — Telephone Encounter (Signed)
Patient states she is not taking PLQ because it made her sick.

## 2021-12-07 DIAGNOSIS — E785 Hyperlipidemia, unspecified: Secondary | ICD-10-CM | POA: Diagnosis not present

## 2021-12-07 DIAGNOSIS — E1121 Type 2 diabetes mellitus with diabetic nephropathy: Secondary | ICD-10-CM | POA: Diagnosis not present

## 2021-12-20 DIAGNOSIS — N184 Chronic kidney disease, stage 4 (severe): Secondary | ICD-10-CM | POA: Diagnosis not present

## 2022-01-01 DIAGNOSIS — E785 Hyperlipidemia, unspecified: Secondary | ICD-10-CM | POA: Diagnosis not present

## 2022-01-01 DIAGNOSIS — E1122 Type 2 diabetes mellitus with diabetic chronic kidney disease: Secondary | ICD-10-CM | POA: Diagnosis not present

## 2022-01-06 DIAGNOSIS — E1121 Type 2 diabetes mellitus with diabetic nephropathy: Secondary | ICD-10-CM | POA: Diagnosis not present

## 2022-01-06 DIAGNOSIS — E785 Hyperlipidemia, unspecified: Secondary | ICD-10-CM | POA: Diagnosis not present

## 2022-01-08 DIAGNOSIS — Z1231 Encounter for screening mammogram for malignant neoplasm of breast: Secondary | ICD-10-CM | POA: Diagnosis not present

## 2022-01-23 DIAGNOSIS — E1121 Type 2 diabetes mellitus with diabetic nephropathy: Secondary | ICD-10-CM | POA: Diagnosis not present

## 2022-01-23 DIAGNOSIS — I129 Hypertensive chronic kidney disease with stage 1 through stage 4 chronic kidney disease, or unspecified chronic kidney disease: Secondary | ICD-10-CM | POA: Diagnosis not present

## 2022-01-23 DIAGNOSIS — Z1331 Encounter for screening for depression: Secondary | ICD-10-CM | POA: Diagnosis not present

## 2022-01-23 DIAGNOSIS — Z1339 Encounter for screening examination for other mental health and behavioral disorders: Secondary | ICD-10-CM | POA: Diagnosis not present

## 2022-01-23 DIAGNOSIS — Z0000 Encounter for general adult medical examination without abnormal findings: Secondary | ICD-10-CM | POA: Diagnosis not present

## 2022-01-23 DIAGNOSIS — E1122 Type 2 diabetes mellitus with diabetic chronic kidney disease: Secondary | ICD-10-CM | POA: Diagnosis not present

## 2022-01-23 DIAGNOSIS — Z1389 Encounter for screening for other disorder: Secondary | ICD-10-CM | POA: Diagnosis not present

## 2022-01-23 DIAGNOSIS — Z139 Encounter for screening, unspecified: Secondary | ICD-10-CM | POA: Diagnosis not present

## 2022-01-23 DIAGNOSIS — N183 Chronic kidney disease, stage 3 unspecified: Secondary | ICD-10-CM | POA: Diagnosis not present

## 2022-01-30 DIAGNOSIS — Z853 Personal history of malignant neoplasm of breast: Secondary | ICD-10-CM | POA: Diagnosis not present

## 2022-02-06 DIAGNOSIS — E1121 Type 2 diabetes mellitus with diabetic nephropathy: Secondary | ICD-10-CM | POA: Diagnosis not present

## 2022-02-06 DIAGNOSIS — E785 Hyperlipidemia, unspecified: Secondary | ICD-10-CM | POA: Diagnosis not present

## 2022-02-07 DIAGNOSIS — K219 Gastro-esophageal reflux disease without esophagitis: Secondary | ICD-10-CM | POA: Diagnosis not present

## 2022-02-07 DIAGNOSIS — N189 Chronic kidney disease, unspecified: Secondary | ICD-10-CM | POA: Diagnosis not present

## 2022-02-07 DIAGNOSIS — R109 Unspecified abdominal pain: Secondary | ICD-10-CM | POA: Diagnosis not present

## 2022-02-20 DIAGNOSIS — N1832 Chronic kidney disease, stage 3b: Secondary | ICD-10-CM | POA: Diagnosis not present

## 2022-02-27 DIAGNOSIS — E1122 Type 2 diabetes mellitus with diabetic chronic kidney disease: Secondary | ICD-10-CM | POA: Diagnosis not present

## 2022-02-27 DIAGNOSIS — N184 Chronic kidney disease, stage 4 (severe): Secondary | ICD-10-CM | POA: Diagnosis not present

## 2022-02-27 DIAGNOSIS — E872 Acidosis, unspecified: Secondary | ICD-10-CM | POA: Diagnosis not present

## 2022-02-27 DIAGNOSIS — I129 Hypertensive chronic kidney disease with stage 1 through stage 4 chronic kidney disease, or unspecified chronic kidney disease: Secondary | ICD-10-CM | POA: Diagnosis not present

## 2022-02-27 DIAGNOSIS — R809 Proteinuria, unspecified: Secondary | ICD-10-CM | POA: Diagnosis not present

## 2022-02-27 DIAGNOSIS — N261 Atrophy of kidney (terminal): Secondary | ICD-10-CM | POA: Diagnosis not present

## 2022-02-27 DIAGNOSIS — D631 Anemia in chronic kidney disease: Secondary | ICD-10-CM | POA: Diagnosis not present

## 2022-03-09 DIAGNOSIS — E1122 Type 2 diabetes mellitus with diabetic chronic kidney disease: Secondary | ICD-10-CM | POA: Diagnosis not present

## 2022-03-09 DIAGNOSIS — K2101 Gastro-esophageal reflux disease with esophagitis, with bleeding: Secondary | ICD-10-CM | POA: Diagnosis not present

## 2022-03-09 DIAGNOSIS — R1013 Epigastric pain: Secondary | ICD-10-CM | POA: Diagnosis not present

## 2022-03-09 DIAGNOSIS — K219 Gastro-esophageal reflux disease without esophagitis: Secondary | ICD-10-CM | POA: Diagnosis not present

## 2022-03-09 DIAGNOSIS — D131 Benign neoplasm of stomach: Secondary | ICD-10-CM | POA: Diagnosis not present

## 2022-03-09 DIAGNOSIS — E785 Hyperlipidemia, unspecified: Secondary | ICD-10-CM | POA: Diagnosis not present

## 2022-03-09 DIAGNOSIS — N189 Chronic kidney disease, unspecified: Secondary | ICD-10-CM | POA: Diagnosis not present

## 2022-03-09 DIAGNOSIS — E1121 Type 2 diabetes mellitus with diabetic nephropathy: Secondary | ICD-10-CM | POA: Diagnosis not present

## 2022-03-09 DIAGNOSIS — I129 Hypertensive chronic kidney disease with stage 1 through stage 4 chronic kidney disease, or unspecified chronic kidney disease: Secondary | ICD-10-CM | POA: Diagnosis not present

## 2022-03-09 DIAGNOSIS — K449 Diaphragmatic hernia without obstruction or gangrene: Secondary | ICD-10-CM | POA: Diagnosis not present

## 2022-03-09 DIAGNOSIS — K317 Polyp of stomach and duodenum: Secondary | ICD-10-CM | POA: Diagnosis not present

## 2022-03-22 DIAGNOSIS — R109 Unspecified abdominal pain: Secondary | ICD-10-CM | POA: Diagnosis not present

## 2022-03-22 DIAGNOSIS — K760 Fatty (change of) liver, not elsewhere classified: Secondary | ICD-10-CM | POA: Diagnosis not present

## 2022-03-22 DIAGNOSIS — R161 Splenomegaly, not elsewhere classified: Secondary | ICD-10-CM | POA: Diagnosis not present

## 2022-04-08 DIAGNOSIS — E1121 Type 2 diabetes mellitus with diabetic nephropathy: Secondary | ICD-10-CM | POA: Diagnosis not present

## 2022-04-08 DIAGNOSIS — E785 Hyperlipidemia, unspecified: Secondary | ICD-10-CM | POA: Diagnosis not present

## 2022-04-19 DIAGNOSIS — H35033 Hypertensive retinopathy, bilateral: Secondary | ICD-10-CM | POA: Diagnosis not present

## 2022-04-19 DIAGNOSIS — E113293 Type 2 diabetes mellitus with mild nonproliferative diabetic retinopathy without macular edema, bilateral: Secondary | ICD-10-CM | POA: Diagnosis not present

## 2022-04-25 DIAGNOSIS — Z23 Encounter for immunization: Secondary | ICD-10-CM | POA: Diagnosis not present

## 2022-05-01 DIAGNOSIS — N189 Chronic kidney disease, unspecified: Secondary | ICD-10-CM | POA: Diagnosis not present

## 2022-05-01 DIAGNOSIS — K219 Gastro-esophageal reflux disease without esophagitis: Secondary | ICD-10-CM | POA: Diagnosis not present

## 2022-05-07 DIAGNOSIS — N184 Chronic kidney disease, stage 4 (severe): Secondary | ICD-10-CM | POA: Diagnosis not present

## 2022-05-09 DIAGNOSIS — E1122 Type 2 diabetes mellitus with diabetic chronic kidney disease: Secondary | ICD-10-CM | POA: Diagnosis not present

## 2022-05-09 DIAGNOSIS — E1121 Type 2 diabetes mellitus with diabetic nephropathy: Secondary | ICD-10-CM | POA: Diagnosis not present

## 2022-05-09 DIAGNOSIS — E785 Hyperlipidemia, unspecified: Secondary | ICD-10-CM | POA: Diagnosis not present

## 2022-05-15 DIAGNOSIS — E872 Acidosis, unspecified: Secondary | ICD-10-CM | POA: Diagnosis not present

## 2022-05-15 DIAGNOSIS — I129 Hypertensive chronic kidney disease with stage 1 through stage 4 chronic kidney disease, or unspecified chronic kidney disease: Secondary | ICD-10-CM | POA: Diagnosis not present

## 2022-05-15 DIAGNOSIS — R809 Proteinuria, unspecified: Secondary | ICD-10-CM | POA: Diagnosis not present

## 2022-05-15 DIAGNOSIS — N261 Atrophy of kidney (terminal): Secondary | ICD-10-CM | POA: Diagnosis not present

## 2022-05-15 DIAGNOSIS — N184 Chronic kidney disease, stage 4 (severe): Secondary | ICD-10-CM | POA: Diagnosis not present

## 2022-05-15 DIAGNOSIS — E876 Hypokalemia: Secondary | ICD-10-CM | POA: Diagnosis not present

## 2022-05-15 DIAGNOSIS — D631 Anemia in chronic kidney disease: Secondary | ICD-10-CM | POA: Diagnosis not present

## 2022-05-15 DIAGNOSIS — E1122 Type 2 diabetes mellitus with diabetic chronic kidney disease: Secondary | ICD-10-CM | POA: Diagnosis not present

## 2022-05-22 DIAGNOSIS — N183 Chronic kidney disease, stage 3 unspecified: Secondary | ICD-10-CM | POA: Diagnosis not present

## 2022-05-22 DIAGNOSIS — Z6827 Body mass index (BMI) 27.0-27.9, adult: Secondary | ICD-10-CM | POA: Diagnosis not present

## 2022-05-22 DIAGNOSIS — E785 Hyperlipidemia, unspecified: Secondary | ICD-10-CM | POA: Diagnosis not present

## 2022-05-22 DIAGNOSIS — E1122 Type 2 diabetes mellitus with diabetic chronic kidney disease: Secondary | ICD-10-CM | POA: Diagnosis not present

## 2022-05-22 DIAGNOSIS — I129 Hypertensive chronic kidney disease with stage 1 through stage 4 chronic kidney disease, or unspecified chronic kidney disease: Secondary | ICD-10-CM | POA: Diagnosis not present

## 2022-06-07 DIAGNOSIS — E113213 Type 2 diabetes mellitus with mild nonproliferative diabetic retinopathy with macular edema, bilateral: Secondary | ICD-10-CM | POA: Diagnosis not present

## 2022-06-07 DIAGNOSIS — H43813 Vitreous degeneration, bilateral: Secondary | ICD-10-CM | POA: Diagnosis not present

## 2022-06-08 DIAGNOSIS — E1121 Type 2 diabetes mellitus with diabetic nephropathy: Secondary | ICD-10-CM | POA: Diagnosis not present

## 2022-06-08 DIAGNOSIS — J441 Chronic obstructive pulmonary disease with (acute) exacerbation: Secondary | ICD-10-CM | POA: Diagnosis not present

## 2022-07-09 DIAGNOSIS — J441 Chronic obstructive pulmonary disease with (acute) exacerbation: Secondary | ICD-10-CM | POA: Diagnosis not present

## 2022-07-09 DIAGNOSIS — E1121 Type 2 diabetes mellitus with diabetic nephropathy: Secondary | ICD-10-CM | POA: Diagnosis not present

## 2022-08-09 DIAGNOSIS — J441 Chronic obstructive pulmonary disease with (acute) exacerbation: Secondary | ICD-10-CM | POA: Diagnosis not present

## 2022-08-09 DIAGNOSIS — E1121 Type 2 diabetes mellitus with diabetic nephropathy: Secondary | ICD-10-CM | POA: Diagnosis not present

## 2022-08-14 DIAGNOSIS — N184 Chronic kidney disease, stage 4 (severe): Secondary | ICD-10-CM | POA: Diagnosis not present

## 2022-08-23 DIAGNOSIS — I129 Hypertensive chronic kidney disease with stage 1 through stage 4 chronic kidney disease, or unspecified chronic kidney disease: Secondary | ICD-10-CM | POA: Diagnosis not present

## 2022-08-23 DIAGNOSIS — E872 Acidosis, unspecified: Secondary | ICD-10-CM | POA: Diagnosis not present

## 2022-08-23 DIAGNOSIS — N261 Atrophy of kidney (terminal): Secondary | ICD-10-CM | POA: Diagnosis not present

## 2022-08-23 DIAGNOSIS — E876 Hypokalemia: Secondary | ICD-10-CM | POA: Diagnosis not present

## 2022-08-23 DIAGNOSIS — D631 Anemia in chronic kidney disease: Secondary | ICD-10-CM | POA: Diagnosis not present

## 2022-08-23 DIAGNOSIS — E1122 Type 2 diabetes mellitus with diabetic chronic kidney disease: Secondary | ICD-10-CM | POA: Diagnosis not present

## 2022-08-23 DIAGNOSIS — R809 Proteinuria, unspecified: Secondary | ICD-10-CM | POA: Diagnosis not present

## 2022-08-23 DIAGNOSIS — N184 Chronic kidney disease, stage 4 (severe): Secondary | ICD-10-CM | POA: Diagnosis not present

## 2022-09-07 DIAGNOSIS — E1121 Type 2 diabetes mellitus with diabetic nephropathy: Secondary | ICD-10-CM | POA: Diagnosis not present

## 2022-09-07 DIAGNOSIS — J441 Chronic obstructive pulmonary disease with (acute) exacerbation: Secondary | ICD-10-CM | POA: Diagnosis not present

## 2022-09-12 DIAGNOSIS — E1121 Type 2 diabetes mellitus with diabetic nephropathy: Secondary | ICD-10-CM | POA: Diagnosis not present

## 2022-09-12 DIAGNOSIS — E1122 Type 2 diabetes mellitus with diabetic chronic kidney disease: Secondary | ICD-10-CM | POA: Diagnosis not present

## 2022-09-12 DIAGNOSIS — E785 Hyperlipidemia, unspecified: Secondary | ICD-10-CM | POA: Diagnosis not present

## 2022-09-25 DIAGNOSIS — Z6829 Body mass index (BMI) 29.0-29.9, adult: Secondary | ICD-10-CM | POA: Diagnosis not present

## 2022-09-25 DIAGNOSIS — I129 Hypertensive chronic kidney disease with stage 1 through stage 4 chronic kidney disease, or unspecified chronic kidney disease: Secondary | ICD-10-CM | POA: Diagnosis not present

## 2022-09-25 DIAGNOSIS — E1121 Type 2 diabetes mellitus with diabetic nephropathy: Secondary | ICD-10-CM | POA: Diagnosis not present

## 2022-09-25 DIAGNOSIS — E1122 Type 2 diabetes mellitus with diabetic chronic kidney disease: Secondary | ICD-10-CM | POA: Diagnosis not present

## 2022-09-25 DIAGNOSIS — N183 Chronic kidney disease, stage 3 unspecified: Secondary | ICD-10-CM | POA: Diagnosis not present

## 2022-10-08 DIAGNOSIS — E1121 Type 2 diabetes mellitus with diabetic nephropathy: Secondary | ICD-10-CM | POA: Diagnosis not present

## 2022-10-08 DIAGNOSIS — J449 Chronic obstructive pulmonary disease, unspecified: Secondary | ICD-10-CM | POA: Diagnosis not present

## 2022-10-11 DIAGNOSIS — E113213 Type 2 diabetes mellitus with mild nonproliferative diabetic retinopathy with macular edema, bilateral: Secondary | ICD-10-CM | POA: Diagnosis not present

## 2022-10-30 DIAGNOSIS — Z6829 Body mass index (BMI) 29.0-29.9, adult: Secondary | ICD-10-CM | POA: Diagnosis not present

## 2022-10-30 DIAGNOSIS — I700 Atherosclerosis of aorta: Secondary | ICD-10-CM | POA: Diagnosis not present

## 2022-10-30 DIAGNOSIS — E1122 Type 2 diabetes mellitus with diabetic chronic kidney disease: Secondary | ICD-10-CM | POA: Diagnosis not present

## 2022-10-30 DIAGNOSIS — N183 Chronic kidney disease, stage 3 unspecified: Secondary | ICD-10-CM | POA: Diagnosis not present

## 2022-10-30 DIAGNOSIS — I129 Hypertensive chronic kidney disease with stage 1 through stage 4 chronic kidney disease, or unspecified chronic kidney disease: Secondary | ICD-10-CM | POA: Diagnosis not present

## 2022-11-07 DIAGNOSIS — E1121 Type 2 diabetes mellitus with diabetic nephropathy: Secondary | ICD-10-CM | POA: Diagnosis not present

## 2022-11-07 DIAGNOSIS — J449 Chronic obstructive pulmonary disease, unspecified: Secondary | ICD-10-CM | POA: Diagnosis not present

## 2022-11-12 DIAGNOSIS — N184 Chronic kidney disease, stage 4 (severe): Secondary | ICD-10-CM | POA: Diagnosis not present

## 2022-11-15 DIAGNOSIS — E113213 Type 2 diabetes mellitus with mild nonproliferative diabetic retinopathy with macular edema, bilateral: Secondary | ICD-10-CM | POA: Diagnosis not present

## 2022-11-21 DIAGNOSIS — E872 Acidosis, unspecified: Secondary | ICD-10-CM | POA: Diagnosis not present

## 2022-11-21 DIAGNOSIS — E876 Hypokalemia: Secondary | ICD-10-CM | POA: Diagnosis not present

## 2022-11-21 DIAGNOSIS — N261 Atrophy of kidney (terminal): Secondary | ICD-10-CM | POA: Diagnosis not present

## 2022-11-21 DIAGNOSIS — E1122 Type 2 diabetes mellitus with diabetic chronic kidney disease: Secondary | ICD-10-CM | POA: Diagnosis not present

## 2022-11-21 DIAGNOSIS — D631 Anemia in chronic kidney disease: Secondary | ICD-10-CM | POA: Diagnosis not present

## 2022-11-21 DIAGNOSIS — I129 Hypertensive chronic kidney disease with stage 1 through stage 4 chronic kidney disease, or unspecified chronic kidney disease: Secondary | ICD-10-CM | POA: Diagnosis not present

## 2022-11-21 DIAGNOSIS — N184 Chronic kidney disease, stage 4 (severe): Secondary | ICD-10-CM | POA: Diagnosis not present

## 2022-11-21 DIAGNOSIS — R809 Proteinuria, unspecified: Secondary | ICD-10-CM | POA: Diagnosis not present

## 2022-12-04 DIAGNOSIS — N184 Chronic kidney disease, stage 4 (severe): Secondary | ICD-10-CM | POA: Diagnosis not present

## 2022-12-04 DIAGNOSIS — Z6829 Body mass index (BMI) 29.0-29.9, adult: Secondary | ICD-10-CM | POA: Diagnosis not present

## 2022-12-04 DIAGNOSIS — N183 Chronic kidney disease, stage 3 unspecified: Secondary | ICD-10-CM | POA: Diagnosis not present

## 2022-12-04 DIAGNOSIS — I129 Hypertensive chronic kidney disease with stage 1 through stage 4 chronic kidney disease, or unspecified chronic kidney disease: Secondary | ICD-10-CM | POA: Diagnosis not present

## 2022-12-04 DIAGNOSIS — E1122 Type 2 diabetes mellitus with diabetic chronic kidney disease: Secondary | ICD-10-CM | POA: Diagnosis not present

## 2022-12-07 DIAGNOSIS — E1121 Type 2 diabetes mellitus with diabetic nephropathy: Secondary | ICD-10-CM | POA: Diagnosis not present

## 2022-12-07 DIAGNOSIS — J441 Chronic obstructive pulmonary disease with (acute) exacerbation: Secondary | ICD-10-CM | POA: Diagnosis not present

## 2022-12-08 DIAGNOSIS — E1121 Type 2 diabetes mellitus with diabetic nephropathy: Secondary | ICD-10-CM | POA: Diagnosis not present

## 2022-12-08 DIAGNOSIS — J449 Chronic obstructive pulmonary disease, unspecified: Secondary | ICD-10-CM | POA: Diagnosis not present

## 2022-12-27 DIAGNOSIS — E113213 Type 2 diabetes mellitus with mild nonproliferative diabetic retinopathy with macular edema, bilateral: Secondary | ICD-10-CM | POA: Diagnosis not present

## 2023-01-07 DIAGNOSIS — J449 Chronic obstructive pulmonary disease, unspecified: Secondary | ICD-10-CM | POA: Diagnosis not present

## 2023-01-07 DIAGNOSIS — E1121 Type 2 diabetes mellitus with diabetic nephropathy: Secondary | ICD-10-CM | POA: Diagnosis not present

## 2023-01-14 DIAGNOSIS — Z1231 Encounter for screening mammogram for malignant neoplasm of breast: Secondary | ICD-10-CM | POA: Diagnosis not present

## 2023-01-14 LAB — HM MAMMOGRAPHY

## 2023-01-15 DIAGNOSIS — E1121 Type 2 diabetes mellitus with diabetic nephropathy: Secondary | ICD-10-CM | POA: Diagnosis not present

## 2023-01-15 DIAGNOSIS — E1122 Type 2 diabetes mellitus with diabetic chronic kidney disease: Secondary | ICD-10-CM | POA: Diagnosis not present

## 2023-01-15 DIAGNOSIS — E785 Hyperlipidemia, unspecified: Secondary | ICD-10-CM | POA: Diagnosis not present

## 2023-01-21 DIAGNOSIS — E669 Obesity, unspecified: Secondary | ICD-10-CM | POA: Diagnosis not present

## 2023-01-21 DIAGNOSIS — E785 Hyperlipidemia, unspecified: Secondary | ICD-10-CM | POA: Diagnosis not present

## 2023-01-21 DIAGNOSIS — Z136 Encounter for screening for cardiovascular disorders: Secondary | ICD-10-CM | POA: Diagnosis not present

## 2023-01-21 DIAGNOSIS — Z1331 Encounter for screening for depression: Secondary | ICD-10-CM | POA: Diagnosis not present

## 2023-01-21 DIAGNOSIS — Z23 Encounter for immunization: Secondary | ICD-10-CM | POA: Diagnosis not present

## 2023-01-21 DIAGNOSIS — E1122 Type 2 diabetes mellitus with diabetic chronic kidney disease: Secondary | ICD-10-CM | POA: Diagnosis not present

## 2023-01-21 DIAGNOSIS — Z0000 Encounter for general adult medical examination without abnormal findings: Secondary | ICD-10-CM | POA: Diagnosis not present

## 2023-01-21 DIAGNOSIS — Z139 Encounter for screening, unspecified: Secondary | ICD-10-CM | POA: Diagnosis not present

## 2023-01-21 DIAGNOSIS — Z1339 Encounter for screening examination for other mental health and behavioral disorders: Secondary | ICD-10-CM | POA: Diagnosis not present

## 2023-01-30 DIAGNOSIS — D0511 Intraductal carcinoma in situ of right breast: Secondary | ICD-10-CM | POA: Diagnosis not present

## 2023-01-30 DIAGNOSIS — N6321 Unspecified lump in the left breast, upper outer quadrant: Secondary | ICD-10-CM | POA: Diagnosis not present

## 2023-01-30 DIAGNOSIS — R92322 Mammographic fibroglandular density, left breast: Secondary | ICD-10-CM | POA: Diagnosis not present

## 2023-01-30 DIAGNOSIS — R928 Other abnormal and inconclusive findings on diagnostic imaging of breast: Secondary | ICD-10-CM | POA: Diagnosis not present

## 2023-01-30 DIAGNOSIS — N6002 Solitary cyst of left breast: Secondary | ICD-10-CM | POA: Diagnosis not present

## 2023-02-07 DIAGNOSIS — E669 Obesity, unspecified: Secondary | ICD-10-CM | POA: Diagnosis not present

## 2023-02-07 DIAGNOSIS — N6321 Unspecified lump in the left breast, upper outer quadrant: Secondary | ICD-10-CM | POA: Diagnosis not present

## 2023-02-07 DIAGNOSIS — E1121 Type 2 diabetes mellitus with diabetic nephropathy: Secondary | ICD-10-CM | POA: Diagnosis not present

## 2023-02-07 DIAGNOSIS — R928 Other abnormal and inconclusive findings on diagnostic imaging of breast: Secondary | ICD-10-CM | POA: Diagnosis not present

## 2023-02-07 DIAGNOSIS — N6012 Diffuse cystic mastopathy of left breast: Secondary | ICD-10-CM | POA: Diagnosis not present

## 2023-02-15 ENCOUNTER — Telehealth: Payer: Self-pay | Admitting: Oncology

## 2023-02-15 DIAGNOSIS — Z853 Personal history of malignant neoplasm of breast: Secondary | ICD-10-CM | POA: Diagnosis not present

## 2023-02-15 DIAGNOSIS — N6011 Diffuse cystic mastopathy of right breast: Secondary | ICD-10-CM | POA: Diagnosis not present

## 2023-02-15 NOTE — Telephone Encounter (Signed)
Contacted pt to schedule an appt. Unable to reach via phone, voicemail was left.    Follow up appointment needed Received: Today Amber Corpus, LPN sent to [facility] Scheduling Amber [name] office called. Patient is needing a follow up appointment with Amber [name]. Patient was seen 10/2021. Patient states someone was suppose to call her and nobody has called her yet.

## 2023-03-05 DIAGNOSIS — N390 Urinary tract infection, site not specified: Secondary | ICD-10-CM | POA: Diagnosis not present

## 2023-03-05 DIAGNOSIS — Z6829 Body mass index (BMI) 29.0-29.9, adult: Secondary | ICD-10-CM | POA: Diagnosis not present

## 2023-03-07 ENCOUNTER — Other Ambulatory Visit: Payer: Medicare HMO

## 2023-03-07 ENCOUNTER — Ambulatory Visit: Payer: Medicare HMO | Admitting: Oncology

## 2023-03-07 NOTE — Progress Notes (Addendum)
ADDENDUM:   Her creatinine is up to 2.19 with a BUN of 46. She is mildly anemic with a hemoglobin of 11.0, probably due to her chronic kidney disease. We will recommend she increase her fluids and send copies of her labs to Dr. Abner Greenspan.    [facility]  [address] [place],  [place]  [address] [phone]  Clinic Day:  03/08/23  Referring physician: Abner Greenspan, MD  CHIEF COMPLAINT:  CC: Stage IA hormone receptor positive right breast cancer  Current Treatment:  Surveillance  HISTORY OF PRESENT ILLNESS:  Amber [name] is a 77 y.o. 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  She was treated with a right simple mastectomy.  She had not been undergoing routine annual mammograms, then had a screening mammogram in June showing possible masses in both breasts.  Diagnostic mammogram which showed a 7 x 3 x 7 mm lobular hypoechoic mass in the right breast at the 10 o'clock position 10 cm from the nipple.  Additional bilateral oval circumscribed masses favored to represent cysts or complicated cysts were also seen.  She had a biopsy of the right breast in July, which revealed ductal carcinoma in situ partially involving a complex sclerosing lesion.  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  No atypia, in situ or invasive malignancy was identified.  She had an MRI was done in early August, which revealed a suspicious lesion in the upper inner quadrant of the right breast, as well as multiple small enhancing masses in the left breast consistent with benign lesions.  Axillary lymph nodes appeared normal.  She then had a final biopsy of the upper inner quadrant of the right breast in August.  Pathology revealed invasive ductal carcinoma, grade 1, and ductal carcinoma in situ.  Estrogen and  progesterone receptors were both positive at 100% and HER2 was negative.  Ki67 was 5%.  She underwent right simple mastectomy and right axillary sentinel lymph node biopsy in September.  Three sentinel lymph nodes were negative for metastasis.  Adjuvant chemotherapy and radiation therapy were not recommended.  Bone density scan in June 2020 revealed osteopenia with a T-score of -2.1 in the left femur neck and a T-score of -1.5 in the spine, for which she is on calcium and vitamin-D.  She was placed on anastrozole 1 mg daily in October 2020.  Left diagnostic mammogram and breast ultrasound in June revealed 3 small, benign left breast cysts, which do not need any further follow-up.  There is no evidence of malignancy in the left breast.  She has stage IV chronic kidney disease and is followed by a nephrologist, Dr. Malen Gauze.   Annual mammogram from July 2022 was clear. Bone density from August revealed stable to improved osteopenia with a T-score of -2.0 of the left femur neck, previously -2.1.  Dual femur total mean measures -1.7, stable.  AP Spine measures -1.5, stable. She developed severe swelling and stiffness of both hands associated with erythema and inability to use her hands. We stopped the anastrozole but it did not seem to improve. She was given two short course of corticosteroids with limited benefit and then referred to a rheumatologist. ANA and RA were negative. She was found to have anemia with iron deficiency and B12 deficiency and is on supplements of  both.  INTERVAL HISTORY:  Amber [name] is here for routine follow up for stage IA hormone receptor positive right breast cancer diagnosed in July, 2020, so she is over 4 years out and had stopped coming for follow-up.   She stopped taking the anastrozole in February of 2023. She was sent back at this time because of some questionable abnormalities of the left breast. Patient states that she feels well and has no complaints of pain. During her last office  visit she complained of arthritis and swelling of both hands and began taking tylenol arthritis, that has resolved this. She had a screening mammogram done on 01/14/2023 that revealed a possible asymmetry. She then had a diagnostic left breast mammogram on 01/30/2023 that revealed persistence of the asymmetry of the upper outer quadrant of the left breat. There was no palpable abnormality. Ultrasound revealed an irregular hypoechoic mass at 2 o'clock, 4 cm from the nipple and measuring 1.0 cm, the left axilla was negative. She then had a ultrasound guided core biopsy of the mass at 2 o'clock in the left breast on 02/07/2023. Pathology reveals fibrocystic changes with usual ductal hyperplasia no atypia, in situ or invasive malignancy identified.  Her labs today are pending and I will call her back with those results. Her check up with me today was all clear but I did recommend a yearly follow-up. She will continue to have annual mammograms. I will see her back in 1 year with CBC and CMP.  She denies signs of infection such as sore throat, sinus drainage, cough, or urinary symptoms.  She denies fevers or recurrent chills. She denies pain. She denies nausea, vomiting, chest pain, dyspnea or cough. Her appetite is good and her weight has been stable.  REVIEW OF SYSTEMS:  Review of Systems  Constitutional: Negative.  Negative for appetite change, chills, diaphoresis, fatigue, fever and unexpected weight change.  HENT:  Negative.  Negative for hearing loss, lump/mass, mouth sores, nosebleeds, sore throat, tinnitus, trouble swallowing and voice change.   Eyes: Negative.  Negative for eye problems and icterus.  Respiratory: Negative.  Negative for chest tightness, cough, hemoptysis, shortness of breath and wheezing.   Cardiovascular: Negative.  Negative for chest pain, leg swelling and palpitations.  Gastrointestinal: Negative.  Negative for abdominal distention, abdominal pain, blood in stool, constipation,  diarrhea, nausea, rectal pain and vomiting.  Endocrine: Negative.   Genitourinary: Negative.  Negative for bladder incontinence, difficulty urinating, dyspareunia, dysuria, frequency, hematuria, menstrual problem, nocturia, pelvic pain, vaginal bleeding and vaginal discharge.   Musculoskeletal: Negative.  Negative for arthralgias, back pain, flank pain, gait problem, myalgias, neck pain and neck stiffness.  Skin: Negative.  Negative for itching, rash and wound.  Neurological:  Positive for numbness (and paresthesias of the bilateral hands). Negative for dizziness, extremity weakness, gait problem, headaches, light-headedness, seizures and speech difficulty.  Hematological: Negative.  Negative for adenopathy. Does not bruise/bleed easily.  Psychiatric/Behavioral: Negative.  Negative for confusion, decreased concentration, depression, sleep disturbance and suicidal ideas. The patient is not nervous/anxious.      VITALS:  Blood pressure 130/76, pulse (!) 54, temperature 97.6 F (36.4 C), temperature source Oral, resp. rate 18, height 5\' 4"  (1.626 m), weight 184 lb 9.6 oz (83.7 kg), SpO2 97%.  Wt Readings from Last 3 Encounters:  03/08/23 184 lb 9.6 oz (83.7 kg)  10/17/21 188 lb (85.3 kg)  08/25/21 188 lb (85.3 kg)    Body mass index is 31.69 kg/m.  Performance status (ECOG): 1 - Symptomatic but completely  ambulatory  PHYSICAL EXAM:  Physical Exam Constitutional:      General: She is not in acute distress.    Appearance: Normal appearance. She is normal weight. She is not ill-appearing, toxic-appearing or diaphoretic.  HENT:     Head: Normocephalic and atraumatic.     Right Ear: Tympanic membrane, ear canal and external ear normal. There is no impacted cerumen.     Left Ear: Tympanic membrane, ear canal and external ear normal. There is no impacted cerumen.     Nose: Nose normal. No congestion or rhinorrhea.     Mouth/Throat:     Mouth: Mucous membranes are moist.     Pharynx:  Oropharynx is clear. No oropharyngeal exudate or posterior oropharyngeal erythema.  Eyes:     General: No scleral icterus.       Right eye: No discharge.        Left eye: No discharge.     Extraocular Movements: Extraocular movements intact.     Conjunctiva/sclera: Conjunctivae normal.     Pupils: Pupils are equal, round, and reactive to light.  Neck:     Vascular: No carotid bruit.  Cardiovascular:     Rate and Rhythm: Normal rate and regular rhythm.     Pulses: Normal pulses.     Heart sounds: Normal heart sounds. No murmur heard.    No friction rub. No gallop.  Pulmonary:     Effort: Pulmonary effort is normal. No respiratory distress.     Breath sounds: Normal breath sounds. No stridor. No wheezing, rhonchi or rales.  Chest:     Chest wall: No tenderness.     Comments: Moderate fibrocystic changes in the left breast but no masses.  Right mastectomy is negative. Abdominal:     General: Bowel sounds are normal. There is no distension.     Palpations: Abdomen is soft. There is no hepatomegaly, splenomegaly or mass.     Tenderness: There is no abdominal tenderness. There is no right CVA tenderness, left CVA tenderness, guarding or rebound.     Hernia: No hernia is present.  Musculoskeletal:        General: No swelling, tenderness, deformity or signs of injury. Normal range of motion.     Right hand: No swelling.     Left hand: No swelling.     Cervical back: Normal range of motion and neck supple. No rigidity or tenderness.     Right lower leg: No edema.     Left lower leg: No edema.  Lymphadenopathy:     Cervical: No cervical adenopathy.     Right cervical: No superficial, deep or posterior cervical adenopathy.    Left cervical: No superficial, deep or posterior cervical adenopathy.     Upper Body:     Right upper body: No supraclavicular, axillary or pectoral adenopathy.     Left upper body: No supraclavicular, axillary or pectoral adenopathy.  Skin:    General: Skin is  warm and dry.     Coloration: Skin is not jaundiced or pale.     Findings: No bruising, erythema, lesion or rash.  Neurological:     General: No focal deficit present.     Mental Status: She is alert and oriented to person, place, and time. Mental status is at baseline.     Cranial Nerves: No cranial nerve deficit.     Sensory: No sensory deficit.     Motor: No weakness.     Coordination: Coordination normal.     Gait:  Gait normal.     Deep Tendon Reflexes: Reflexes normal.  Psychiatric:        Mood and Affect: Mood normal.        Behavior: Behavior normal.        Thought Content: Thought content normal.        Judgment: Judgment normal.    LABS:      Latest Ref Rng & Units 03/08/2023    3:29 PM 10/17/2021   12:00 AM 05/19/2021   12:00 AM  CBC  WBC 4.0 - 10.5 K/uL 9.0  9.9     14.5   Hemoglobin 12.0 - 15.0 g/dL 40.3  47.4     25.9   Hematocrit 36.0 - 46.0 % 35.5  38     35   Platelets 150 - 400 K/uL 185  197     146      This result is from an external source.      Latest Ref Rng & Units 03/08/2023    3:29 PM 10/17/2021   12:00 AM 05/19/2021   12:00 AM  CMP  Glucose 70 - 99 mg/dL 563     BUN 8 - 23 mg/dL 46  32     34   Creatinine 0.44 - 1.00 mg/dL 8.75  1.8     1.5   Sodium 135 - 145 mmol/L 140  142     136   Potassium 3.5 - 5.1 mmol/L 3.9  3.7     4.0   Chloride 98 - 111 mmol/L 110  106     104   CO2 22 - 32 mmol/L 20  21     23    Calcium 8.9 - 10.3 mg/dL 8.9  9.0     9.0   Total Protein 6.5 - 8.1 g/dL 6.3     Total Bilirubin 0.3 - 1.2 mg/dL 0.3     Alkaline Phos 38 - 126 U/L 89  81     88   AST 15 - 41 U/L 16  21     27    ALT 0 - 44 U/L 12  14     24       This result is from an external source.     STUDIES:  No results found.   Allergies: No Known Allergies  Current Medications: Current Outpatient Medications  Medication Sig Dispense Refill   cephALEXin (KEFLEX) 500 MG capsule Take 500 mg by mouth 4 (four) times daily.     hydrALAZINE (APRESOLINE) 50  MG tablet Take 50 mg by mouth 3 (three) times daily.     losartan (COZAAR) 100 MG tablet Take 100 mg by mouth daily.     amLODipine (NORVASC) 5 MG tablet      anastrozole (ARIMIDEX) 1 MG tablet TAKE 1 TABLET EVERY DAY (Patient not taking: Reported on 08/25/2021) 90 tablet 3   aspirin 81 MG EC tablet Take by mouth.     Cholecalciferol 125 MCG (5000 UT) capsule Take by mouth.     doxazosin (CARDURA) 2 MG tablet      fluticasone furoate-vilanterol (BREO ELLIPTA) 100-25 MCG/INH AEPB Inhale 1 puff into the lungs daily.     furosemide (LASIX) 40 MG tablet      glimepiride (AMARYL) 2 MG tablet      metoprolol succinate (TOPROL-XL) 50 MG 24 hr tablet      omeprazole (PRILOSEC) 40 MG capsule      pioglitazone (ACTOS) 15 MG tablet      potassium  chloride (KLOR-CON) 10 MEQ tablet Take by mouth.     pravastatin (PRAVACHOL) 40 MG tablet      traMADol (ULTRAM) 50 MG tablet Take 1 tablet (50 mg total) by mouth every 6 (six) hours as needed. (Patient not taking: Reported on 04/18/2021) 90 tablet 0   No current facility-administered medications for this visit.    ASSESSMENT & PLAN:  Assessment:   Stage IA hormone receptor positive breast cancer (T1a N0 M0), diagnosed in July 2020.  She remains without evidence of recurrence.  Anastrozole was on hold due to her bilateral hand changes and she never resumed it. Fortunately she had a tiny breast cancer and is at low risk for recurrence.    2.  Osteopenia, for which she takes calcium/vitamin D.  She will be due for repeat bone density scan in August 2024.  3. Severe pain, swelling and stiffness of the bilateral hands. This has resolved with tylenol arthritis, which she continues to take.   4. Anemia, mild and responding to supplement.  She does continue oral iron supplement a couple times per week and B12 injections.  Today's labs are pending.    5. Renal insufficiency, improved. She continues to follow with her nephrologist, Dr. Vallery Sa.  6.  Fibrocystic changes and especially prominent in the upper outer quadrant of the left breast. Biopsy confirms this to be benign.    Plan:  She had a screening mammogram done on 01/14/2023 that revealed a possible asymmetry. She then had a diagnostic left breast mammogram on 01/30/2023 that revealed persistences of the asymmetry of the upper outer quadrant of the left breat. There was no palpable abnormality. Ultrasound revealed an irregular hypoechoic mass at 2 o'clock, 4cm from the nipple and measuring 1.0cm, the left axilla was negative. She then had a ultrasound guided core biopsy of the mass at 2 o'clock in the left breast on 02/07/2023. Pathology reveals fibrocystic changes with usual ductal hyperplasia no atypia, in situ or invasive malignancy identified.  Her labs today are pending and I will call her back with those results. Her check up with me today was all clear but I did recommend a yearly follow-up. She will continue to have annual mammograms. I will see her back in 1 year with CBC and CMP. The patient understands the plans discussed today and is in agreement with them.  She knows to contact our office if she develops concerns regarding her breast cancer.   I provided 16 minutes of face-to-face time during this this encounter and > 50% was spent counseling as documented under my assessment and plan.    Dellia Beckwith, MD [facility] [address] [place] [address] Dept: [phone] Dept Fax: [phone]    Rulon Sera Lassiter,acting as a scribe for Dellia Beckwith, MD.,have documented all relevant documentation on the behalf of Dellia Beckwith, MD,as directed by  Dellia Beckwith, MD while in the presence of Dellia Beckwith, MD.   I have reviewed this report as typed by the medical scribe, and it is complete and accurate.

## 2023-03-08 ENCOUNTER — Encounter: Payer: Self-pay | Admitting: Oncology

## 2023-03-08 ENCOUNTER — Other Ambulatory Visit: Payer: Self-pay | Admitting: Oncology

## 2023-03-08 ENCOUNTER — Inpatient Hospital Stay: Payer: Medicare HMO | Admitting: Oncology

## 2023-03-08 ENCOUNTER — Inpatient Hospital Stay: Payer: Medicare HMO | Attending: Oncology

## 2023-03-08 VITALS — BP 130/76 | HR 54 | Temp 97.6°F | Resp 18 | Ht 64.0 in | Wt 184.6 lb

## 2023-03-08 DIAGNOSIS — C50211 Malignant neoplasm of upper-inner quadrant of right female breast: Secondary | ICD-10-CM | POA: Diagnosis not present

## 2023-03-08 DIAGNOSIS — C50311 Malignant neoplasm of lower-inner quadrant of right female breast: Secondary | ICD-10-CM | POA: Diagnosis not present

## 2023-03-08 LAB — CBC WITH DIFFERENTIAL (CANCER CENTER ONLY)
Abs Immature Granulocytes: 0.05 10*3/uL (ref 0.00–0.07)
Basophils Absolute: 0.1 10*3/uL (ref 0.0–0.1)
Basophils Relative: 1 %
Eosinophils Absolute: 0.2 10*3/uL (ref 0.0–0.5)
Eosinophils Relative: 2 %
HCT: 35.5 % — ABNORMAL LOW (ref 36.0–46.0)
Hemoglobin: 11 g/dL — ABNORMAL LOW (ref 12.0–15.0)
Immature Granulocytes: 1 %
Lymphocytes Relative: 19 %
Lymphs Abs: 1.7 10*3/uL (ref 0.7–4.0)
MCH: 28 pg (ref 26.0–34.0)
MCHC: 31 g/dL (ref 30.0–36.0)
MCV: 90.3 fL (ref 80.0–100.0)
Monocytes Absolute: 0.7 10*3/uL (ref 0.1–1.0)
Monocytes Relative: 8 %
Neutro Abs: 6.3 10*3/uL (ref 1.7–7.7)
Neutrophils Relative %: 69 %
Platelet Count: 185 10*3/uL (ref 150–400)
RBC: 3.93 MIL/uL (ref 3.87–5.11)
RDW: 15 % (ref 11.5–15.5)
WBC Count: 9 10*3/uL (ref 4.0–10.5)
nRBC: 0 % (ref 0.0–0.2)

## 2023-03-08 LAB — CMP (CANCER CENTER ONLY)
ALT: 12 U/L (ref 0–44)
AST: 16 U/L (ref 15–41)
Albumin: 3.8 g/dL (ref 3.5–5.0)
Alkaline Phosphatase: 89 U/L (ref 38–126)
Anion gap: 10 (ref 5–15)
BUN: 46 mg/dL — ABNORMAL HIGH (ref 8–23)
CO2: 20 mmol/L — ABNORMAL LOW (ref 22–32)
Calcium: 8.9 mg/dL (ref 8.9–10.3)
Chloride: 110 mmol/L (ref 98–111)
Creatinine: 2.19 mg/dL — ABNORMAL HIGH (ref 0.44–1.00)
GFR, Estimated: 22 mL/min — ABNORMAL LOW (ref >60)
Glucose, Bld: 149 mg/dL — ABNORMAL HIGH (ref 70–99)
Potassium: 3.9 mmol/L (ref 3.5–5.1)
Sodium: 140 mmol/L (ref 135–145)
Total Bilirubin: 0.3 mg/dL (ref 0.3–1.2)
Total Protein: 6.3 g/dL — ABNORMAL LOW (ref 6.5–8.1)

## 2023-03-10 DIAGNOSIS — E669 Obesity, unspecified: Secondary | ICD-10-CM | POA: Diagnosis not present

## 2023-03-10 DIAGNOSIS — E1121 Type 2 diabetes mellitus with diabetic nephropathy: Secondary | ICD-10-CM | POA: Diagnosis not present

## 2023-03-12 ENCOUNTER — Telehealth: Payer: Self-pay | Admitting: Oncology

## 2023-03-12 NOTE — Telephone Encounter (Signed)
Contacted pt to schedule an appt. Unable to reach via phone, voicemail was left.    Scheduling Message Entered by Gery Pray H on 03/08/2023 at  3:20 PM Priority: Routine <No visit type provided>  Department: [facility]  Provider:  Scheduling Notes:  RT 1 year

## 2023-03-19 DIAGNOSIS — I129 Hypertensive chronic kidney disease with stage 1 through stage 4 chronic kidney disease, or unspecified chronic kidney disease: Secondary | ICD-10-CM | POA: Diagnosis not present

## 2023-03-19 DIAGNOSIS — D631 Anemia in chronic kidney disease: Secondary | ICD-10-CM | POA: Diagnosis not present

## 2023-03-19 DIAGNOSIS — E872 Acidosis, unspecified: Secondary | ICD-10-CM | POA: Diagnosis not present

## 2023-03-19 DIAGNOSIS — R809 Proteinuria, unspecified: Secondary | ICD-10-CM | POA: Diagnosis not present

## 2023-03-19 DIAGNOSIS — E1122 Type 2 diabetes mellitus with diabetic chronic kidney disease: Secondary | ICD-10-CM | POA: Diagnosis not present

## 2023-03-19 DIAGNOSIS — E876 Hypokalemia: Secondary | ICD-10-CM | POA: Diagnosis not present

## 2023-03-19 DIAGNOSIS — N261 Atrophy of kidney (terminal): Secondary | ICD-10-CM | POA: Diagnosis not present

## 2023-03-19 DIAGNOSIS — N184 Chronic kidney disease, stage 4 (severe): Secondary | ICD-10-CM | POA: Diagnosis not present

## 2023-04-09 DIAGNOSIS — E1121 Type 2 diabetes mellitus with diabetic nephropathy: Secondary | ICD-10-CM | POA: Diagnosis not present

## 2023-04-09 DIAGNOSIS — E669 Obesity, unspecified: Secondary | ICD-10-CM | POA: Diagnosis not present

## 2023-04-26 DIAGNOSIS — Z23 Encounter for immunization: Secondary | ICD-10-CM | POA: Diagnosis not present

## 2023-04-30 DIAGNOSIS — Z6830 Body mass index (BMI) 30.0-30.9, adult: Secondary | ICD-10-CM | POA: Diagnosis not present

## 2023-04-30 DIAGNOSIS — I200 Unstable angina: Secondary | ICD-10-CM | POA: Diagnosis not present

## 2023-05-01 ENCOUNTER — Ambulatory Visit: Payer: Medicare HMO

## 2023-05-01 VITALS — BP 156/92 | HR 53 | Ht 66.0 in | Wt 188.0 lb

## 2023-05-01 DIAGNOSIS — E785 Hyperlipidemia, unspecified: Secondary | ICD-10-CM | POA: Diagnosis not present

## 2023-05-01 DIAGNOSIS — I129 Hypertensive chronic kidney disease with stage 1 through stage 4 chronic kidney disease, or unspecified chronic kidney disease: Secondary | ICD-10-CM | POA: Diagnosis not present

## 2023-05-01 DIAGNOSIS — R0609 Other forms of dyspnea: Secondary | ICD-10-CM

## 2023-05-01 DIAGNOSIS — E876 Hypokalemia: Secondary | ICD-10-CM | POA: Diagnosis not present

## 2023-05-01 DIAGNOSIS — Q600 Renal agenesis, unilateral: Secondary | ICD-10-CM | POA: Diagnosis not present

## 2023-05-01 DIAGNOSIS — I700 Atherosclerosis of aorta: Secondary | ICD-10-CM | POA: Diagnosis not present

## 2023-05-01 DIAGNOSIS — I214 Non-ST elevation (NSTEMI) myocardial infarction: Secondary | ICD-10-CM | POA: Diagnosis not present

## 2023-05-01 DIAGNOSIS — E1122 Type 2 diabetes mellitus with diabetic chronic kidney disease: Secondary | ICD-10-CM | POA: Diagnosis not present

## 2023-05-01 DIAGNOSIS — J9621 Acute and chronic respiratory failure with hypoxia: Secondary | ICD-10-CM | POA: Diagnosis not present

## 2023-05-01 DIAGNOSIS — K449 Diaphragmatic hernia without obstruction or gangrene: Secondary | ICD-10-CM | POA: Diagnosis not present

## 2023-05-01 DIAGNOSIS — N184 Chronic kidney disease, stage 4 (severe): Secondary | ICD-10-CM | POA: Diagnosis not present

## 2023-05-01 DIAGNOSIS — I2510 Atherosclerotic heart disease of native coronary artery without angina pectoris: Secondary | ICD-10-CM | POA: Diagnosis not present

## 2023-05-01 DIAGNOSIS — N183 Chronic kidney disease, stage 3 unspecified: Secondary | ICD-10-CM | POA: Diagnosis not present

## 2023-05-01 DIAGNOSIS — E66811 Obesity, class 1: Secondary | ICD-10-CM | POA: Diagnosis not present

## 2023-05-01 DIAGNOSIS — Z79899 Other long term (current) drug therapy: Secondary | ICD-10-CM | POA: Diagnosis not present

## 2023-05-01 DIAGNOSIS — Z87891 Personal history of nicotine dependence: Secondary | ICD-10-CM | POA: Diagnosis not present

## 2023-05-01 DIAGNOSIS — R001 Bradycardia, unspecified: Secondary | ICD-10-CM | POA: Diagnosis not present

## 2023-05-01 DIAGNOSIS — I5021 Acute systolic (congestive) heart failure: Secondary | ICD-10-CM | POA: Diagnosis not present

## 2023-05-01 DIAGNOSIS — D631 Anemia in chronic kidney disease: Secondary | ICD-10-CM | POA: Diagnosis not present

## 2023-05-01 DIAGNOSIS — R079 Chest pain, unspecified: Secondary | ICD-10-CM | POA: Diagnosis not present

## 2023-05-01 DIAGNOSIS — E119 Type 2 diabetes mellitus without complications: Secondary | ICD-10-CM | POA: Diagnosis not present

## 2023-05-01 DIAGNOSIS — I161 Hypertensive emergency: Secondary | ICD-10-CM | POA: Diagnosis not present

## 2023-05-01 DIAGNOSIS — I25110 Atherosclerotic heart disease of native coronary artery with unstable angina pectoris: Secondary | ICD-10-CM | POA: Diagnosis not present

## 2023-05-01 DIAGNOSIS — Z7984 Long term (current) use of oral hypoglycemic drugs: Secondary | ICD-10-CM | POA: Diagnosis not present

## 2023-05-01 DIAGNOSIS — I249 Acute ischemic heart disease, unspecified: Secondary | ICD-10-CM | POA: Diagnosis not present

## 2023-05-01 DIAGNOSIS — N179 Acute kidney failure, unspecified: Secondary | ICD-10-CM | POA: Diagnosis not present

## 2023-05-01 DIAGNOSIS — I5023 Acute on chronic systolic (congestive) heart failure: Secondary | ICD-10-CM | POA: Diagnosis not present

## 2023-05-01 DIAGNOSIS — J449 Chronic obstructive pulmonary disease, unspecified: Secondary | ICD-10-CM | POA: Diagnosis not present

## 2023-05-01 DIAGNOSIS — E11649 Type 2 diabetes mellitus with hypoglycemia without coma: Secondary | ICD-10-CM | POA: Diagnosis not present

## 2023-05-01 DIAGNOSIS — I10 Essential (primary) hypertension: Secondary | ICD-10-CM | POA: Diagnosis not present

## 2023-05-01 DIAGNOSIS — I255 Ischemic cardiomyopathy: Secondary | ICD-10-CM | POA: Diagnosis not present

## 2023-05-01 DIAGNOSIS — J9601 Acute respiratory failure with hypoxia: Secondary | ICD-10-CM | POA: Diagnosis not present

## 2023-05-01 DIAGNOSIS — E669 Obesity, unspecified: Secondary | ICD-10-CM | POA: Diagnosis not present

## 2023-05-01 DIAGNOSIS — I200 Unstable angina: Secondary | ICD-10-CM | POA: Diagnosis not present

## 2023-05-01 DIAGNOSIS — I160 Hypertensive urgency: Secondary | ICD-10-CM | POA: Diagnosis not present

## 2023-05-01 DIAGNOSIS — I130 Hypertensive heart and chronic kidney disease with heart failure and stage 1 through stage 4 chronic kidney disease, or unspecified chronic kidney disease: Secondary | ICD-10-CM | POA: Diagnosis not present

## 2023-05-01 DIAGNOSIS — R9439 Abnormal result of other cardiovascular function study: Secondary | ICD-10-CM | POA: Diagnosis not present

## 2023-05-01 DIAGNOSIS — N189 Chronic kidney disease, unspecified: Secondary | ICD-10-CM | POA: Diagnosis not present

## 2023-05-01 DIAGNOSIS — I509 Heart failure, unspecified: Secondary | ICD-10-CM | POA: Diagnosis not present

## 2023-05-01 DIAGNOSIS — Q219 Congenital malformation of cardiac septum, unspecified: Secondary | ICD-10-CM | POA: Diagnosis not present

## 2023-05-01 DIAGNOSIS — Z9011 Acquired absence of right breast and nipple: Secondary | ICD-10-CM | POA: Diagnosis not present

## 2023-05-01 DIAGNOSIS — Z6831 Body mass index (BMI) 31.0-31.9, adult: Secondary | ICD-10-CM | POA: Diagnosis not present

## 2023-05-01 DIAGNOSIS — I429 Cardiomyopathy, unspecified: Secondary | ICD-10-CM | POA: Diagnosis not present

## 2023-05-01 DIAGNOSIS — K219 Gastro-esophageal reflux disease without esophagitis: Secondary | ICD-10-CM | POA: Diagnosis not present

## 2023-05-01 DIAGNOSIS — E782 Mixed hyperlipidemia: Secondary | ICD-10-CM | POA: Diagnosis not present

## 2023-05-01 HISTORY — DX: Other forms of dyspnea: R06.09

## 2023-05-01 NOTE — Assessment & Plan Note (Signed)
Concerning for acute heart failure versus acute coronary syndrome.  Has significant cardiovascular risk factors. Would recommend further acute evaluation in the emergency room to rule out ACS and assess her metabolic and electrolyte panel.  Discussed with her further evaluation at emergency room. She is agreeable. Called and reviewed her history with Dr. Allie Dimmer at [facility] emergency room.  Patient's husband who is here for the visit with her but was not in the exam room today will be bringing her to the ER in their private vehicle.Amber [name]

## 2023-05-01 NOTE — Assessment & Plan Note (Signed)
Progressive symptoms over the past couple months.  Atypical noncardiac secondary to hiatal hernia versus cardiac etiology  Further evaluation in the ER recommended. Will require serial troponin evaluation, EKG and monitoring overnight.  Structural and functional assessment with transthoracic echocardiogram to rule out any significant LV dysfunction.

## 2023-05-01 NOTE — Progress Notes (Signed)
Cardiology Consultation:    Date:  05/01/2023   ID:  Amber [name], DOB Mar 10, 1946, MRN [id number]  PCP:  Abner Greenspan, MD  Cardiologist:  Marlyn Corporal Lorene Klimas, MD Nephrologist: Dr. Vallery Sa  Referring MD: Abner Greenspan, MD   No chief complaint on file.    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  With worsening symptoms of chest discomfort atypical for the last couple months with sounds more reflux related secondary to hiatal hernia.  However she also describes chest discomfort which presents as heaviness radiating to both shoulders with minimal ambulation with day-to-day activities at home which could be a cardiac origin and suggestive of angina.  She also has significant dyspnea on exertion that has been progressing over the last 2 to 3 months more so over the past month, associated with weight gain of over 20 pounds in the past couple months.   Problem List Items Addressed This Visit     Chest pain of unknown etiology    Progressive symptoms over the past couple months.  Atypical noncardiac secondary to hiatal hernia versus cardiac etiology  Further evaluation in the ER recommended. Will require serial troponin evaluation, EKG and monitoring overnight.  Structural and functional assessment with transthoracic echocardiogram to rule out any significant LV dysfunction.        HLD (hyperlipidemia) - Primary   Relevant Orders   EKG 12-Lead (Completed)   Dyspnea on exertion    Concerning for acute heart failure versus acute coronary syndrome.  Has significant cardiovascular  risk factors. Would recommend further acute evaluation in the emergency room to rule out ACS and assess her metabolic and electrolyte panel.  Discussed with her further evaluation at emergency room. She is agreeable. Called and reviewed her history with Dr. Allie Dimmer at [facility] emergency room.  Patient's husband who is here for the visit with her but was not in the exam room today will be bringing her to the ER in their private vehicle..      If no significant cardiac etiology, can workup for noncardiac causes such as hiatal hernia on an outpatient basis.   History of Present Illness:    Amber [name] is a 77 y.o. female who is being seen today for the evaluation of shortness of breath and chest pain at the request of Abner Greenspan, MD.  History of coronary artery disease mild nonobstructive for the last 08-15-2017 showing proximal LAD 50% lesion, mid RCA 35% lesion [this was done in the setting of chest pain concerning for unstable angina and abnormal stress test with nuclear imaging]. 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  Here for the visit by herself  Mentions for the past couple months she has been having atypical chest pain, associated with reflux into the throat.  No particular association with position.  But also has been noticing reduced effort tolerance  walking a short distance or household activities in the kitchen resulting in chest discomfort radiating to both shoulders.  Also associated with shortness of breath with minimal effort.  For the past month this has progressed further. Reports weight gain of 20  pounds in the past couple months. Reports mild bilateral lower extremity edema worse towards the end of the day.  Denies any calf tenderness Denies any fever   EKG in the clinic today shows sinus rhythm heart rate 53/min, PR interval 190 ms, QRS duration 86 ms, QTc 442 ms.  No significant ST-T changes.  Last blood work to review is from 03-08-2023 hemoglobin 11, hematocrit 35.5, WBC 9, platelets 185 Sodium 141 potassium 3.9, BUN 46, creatinine 2.19, EGFR 22 which is a drop in kidney function compared to prior lab work from a year ago Normal transaminases and alkaline phosphatase  Past Medical History:  Diagnosis Date   Chronic kidney disease    Diabetes (HCC)    Hypertension     Past Surgical History:  Procedure Laterality Date   BACK SURGERY     x 2   BREAST BIOPSY Right    x3    Current Medications: Current Meds  Medication Sig   amLODipine (NORVASC) 5 MG tablet Take 5 mg by mouth daily.   aspirin 81 MG EC tablet Take 81 mg by mouth daily.   Cholecalciferol 125 MCG (5000 UT) capsule Take 5,000 Units by mouth daily.   doxazosin (CARDURA) 2 MG tablet Take 2 mg by mouth daily.   fluticasone furoate-vilanterol (BREO ELLIPTA) 100-25 MCG/INH AEPB Inhale 1 puff into the lungs daily.   furosemide (LASIX) 40 MG tablet Take 40 mg by mouth daily.   glimepiride (AMARYL) 2 MG tablet Take 2 mg by mouth daily with breakfast.   hydrALAZINE (APRESOLINE) 50 MG tablet Take 50 mg by mouth 3 (three) times daily.   losartan (COZAAR) 100 MG tablet Take 100 mg by mouth daily.   metoprolol succinate (TOPROL-XL) 50 MG 24 hr tablet Take 50 mg by mouth 2 (two) times daily.   omeprazole (PRILOSEC) 40 MG capsule Take 40 mg by mouth 2 (two) times daily.   pioglitazone (ACTOS) 15 MG tablet Take 15 mg by mouth daily.   potassium chloride (KLOR-CON) 10 MEQ tablet Take 10 mEq by mouth 2 (two) times daily.   pravastatin (PRAVACHOL) 40 MG tablet Take 80 mg by mouth daily.   sodium bicarbonate 650 MG tablet  Take 650 mg by mouth daily.     Allergies:   Patient has no known allergies.   Social History   Socioeconomic History   Marital status: Married    Spouse name: Not on file   Number of children: Not on file   Years of education: Not on file   Highest education level: Not on file  Occupational History   Not on file  Tobacco Use   Smoking status: Former    Current packs/day: 0.00    Average packs/day: 1 pack/day for 50.0 years (50.0 ttl pk-yrs)    Types: Cigarettes    Start date: 34    Quit date: 2014    Years since quitting: 10.8   Smokeless tobacco: Never  Vaping Use   Vaping status: Never Used  Substance and Sexual Activity   Alcohol use: Not Currently   Drug use: Not Currently   Sexual activity: Not Currently  Other Topics Concern   Not on file  Social History Narrative   Not on file   Social  Determinants of Health   Financial Resource Strain: Not on file  Food Insecurity: Not on file  Transportation Needs: Not on file  Physical Activity: Not on file  Stress: Not on file  Social Connections: Not on file     Family History: The patient's family history includes Diabetes in her sister; Healthy in her son and son; Heart disease in her father and sister; Kidney disease in her sister; Throat cancer in her mother. ROS:   Please see the history of present illness.    All 14 point review of systems negative except as described per history of present illness.  EKGs/Labs/Other Studies Reviewed:    The following studies were reviewed today:   EKG:  EKG Interpretation Date/Time:  Wednesday May 01 2023 13:04:35 EDT Ventricular Rate:  53 PR Interval:  190 QRS Duration:  86 QT Interval:  472 QTC Calculation: 442 R Axis:   26  Text Interpretation: Sinus bradycardia with sinus arrhythmia Otherwise normal ECG No previous ECGs available Confirmed by Huntley Dec reddy [phone]) on 05/01/2023 1:12:12 PM    Recent Labs: 03/08/2023: ALT 12; BUN 46; Creatinine  2.19; Hemoglobin 11.0; Platelet Count 185; Potassium 3.9; Sodium 140  Recent Lipid Panel No results found for: "CHOL", "TRIG", "HDL", "CHOLHDL", "VLDL", "LDLCALC", "LDLDIRECT"  Impression  :  (1) Essentially normal structure and minimally abnormal function of left  ventricle characterized by:  (A) essentially normal structure diet diet  diet; (B) normal systolic function (actually, hypercontractile); (C)  estimated ejection fraction, 60-65%; (D) essentially normal filling  pressures (which increase mildly abnormally following atrial contraction,  consistent with diastolic dysfunction).   (2) Borderline significant, multivessel atherosclerotic coronary artery  disease characterized by:  (A) moderate grade stenosis extending from  ostium into proximal segment of left anterior descending coronary artery;  (B) low/moderate grade stenosis within mid segment of right coronary  artery; (C) low grade stenosis within ostial/proximal segment of left  circumflex coronary artery.  RECOMMENDATION PLAN:   (1) Although coronary artery microvascular dysfunction has not been fully  excluded to explain the patient's presenting symptom complex, abnormal  electrocardiogram and abnormal exercise treadmill Cardiolite scan,  nonetheless consider pursuit of diagnostic evaluation into potential  noncardiovascular causes of angina, fatigue/weakness and dyspnea/shortness  of breath.   (2) Otherwise continue aggressive medical management oriented towards  history diagnosed systemic hypertension, non-insulin-dependent diabetes  mellitus and dyslipidemia (all in setting of concurrent single kidney). Narrative  This result has an attachment that is not available.    Ost LAD to Prox LAD lesion is 50% stenosed.   Ost Cx to Prox Cx lesion is 30% stenosed.   Mid RCA lesion is 40% stenosed.   FINAL  Coronary Findings Diagnostic Dominance: Co-dominant  Left Anterior Descending: Ost LAD to Prox LAD lesion is  50% stenosed.  Left Circumflex: Ost Cx to Prox Cx lesion is 30% stenosed.  Right Coronary Artery: Mid RCA lesion is 40% stenosed.   Intervention  No interventions have been documented.  Physical Exam:    VS:  BP (!) 156/92   Pulse (!) 53   Ht 5\' 6"  (1.676 m)   Wt 188 lb (85.3 kg)   SpO2 95%   BMI 30.34 kg/m     Wt Readings from Last 3 Encounters:  05/01/23 188 lb (85.3 kg)  03/08/23 184 lb 9.6 oz (83.7 kg)  10/17/21 188 lb (85.3 kg)     GENERAL:  Well nourished, well developed in no acute distress NECK: No JVD; No carotid bruits  CARDIAC: RRR, S1 and S2 present, no murmurs, no rubs, no gallops CHEST:  Clear to auscultation without rales, wheezing or rhonchi  Extremities: 1+ bilateral pitting pedal edema. Pulses bilaterally symmetric with radial 2+ and dorsalis pedis 2+ NEUROLOGIC:  Alert and oriented x 3  Medication Adjustments/Labs and Tests Ordered: Current medicines are reviewed at length with the patient today.  Concerns regarding medicines are outlined above.  Orders Placed This Encounter  Procedures   EKG 12-Lead   No orders of the defined types were placed in this encounter.   Signed, Cecille Amsterdam, MD, MPH, [facility]. 05/01/2023 1:48 PM    [facility]

## 2023-05-02 ENCOUNTER — Encounter (HOSPITAL_COMMUNITY): Payer: Self-pay

## 2023-05-02 DIAGNOSIS — R001 Bradycardia, unspecified: Secondary | ICD-10-CM | POA: Diagnosis not present

## 2023-05-02 DIAGNOSIS — N179 Acute kidney failure, unspecified: Secondary | ICD-10-CM | POA: Diagnosis not present

## 2023-05-02 DIAGNOSIS — I25110 Atherosclerotic heart disease of native coronary artery with unstable angina pectoris: Secondary | ICD-10-CM | POA: Diagnosis not present

## 2023-05-02 DIAGNOSIS — I10 Essential (primary) hypertension: Secondary | ICD-10-CM | POA: Diagnosis not present

## 2023-05-03 ENCOUNTER — Other Ambulatory Visit: Payer: Self-pay

## 2023-05-03 ENCOUNTER — Encounter (HOSPITAL_COMMUNITY): Payer: Self-pay | Admitting: Internal Medicine

## 2023-05-03 ENCOUNTER — Inpatient Hospital Stay (HOSPITAL_COMMUNITY)
Admission: RE | Admit: 2023-05-03 | Discharge: 2023-05-12 | DRG: 321 | Disposition: A | Discharge status: Home / Self Care | Payer: Medicare HMO | Source: Other Acute Inpatient Hospital | Attending: Internal Medicine | Admitting: Internal Medicine

## 2023-05-03 DIAGNOSIS — R9439 Abnormal result of other cardiovascular function study: Secondary | ICD-10-CM

## 2023-05-03 DIAGNOSIS — Z955 Presence of coronary angioplasty implant and graft: Secondary | ICD-10-CM

## 2023-05-03 DIAGNOSIS — K219 Gastro-esophageal reflux disease without esophagitis: Secondary | ICD-10-CM | POA: Diagnosis present

## 2023-05-03 DIAGNOSIS — I5021 Acute systolic (congestive) heart failure: Secondary | ICD-10-CM | POA: Insufficient documentation

## 2023-05-03 DIAGNOSIS — I509 Heart failure, unspecified: Secondary | ICD-10-CM | POA: Insufficient documentation

## 2023-05-03 DIAGNOSIS — I429 Cardiomyopathy, unspecified: Secondary | ICD-10-CM | POA: Diagnosis present

## 2023-05-03 DIAGNOSIS — E66811 Obesity, class 1: Secondary | ICD-10-CM | POA: Diagnosis present

## 2023-05-03 DIAGNOSIS — I2510 Atherosclerotic heart disease of native coronary artery without angina pectoris: Secondary | ICD-10-CM | POA: Diagnosis present

## 2023-05-03 DIAGNOSIS — D631 Anemia in chronic kidney disease: Secondary | ICD-10-CM | POA: Diagnosis present

## 2023-05-03 DIAGNOSIS — I200 Unstable angina: Secondary | ICD-10-CM

## 2023-05-03 DIAGNOSIS — Z7984 Long term (current) use of oral hypoglycemic drugs: Secondary | ICD-10-CM

## 2023-05-03 DIAGNOSIS — I255 Ischemic cardiomyopathy: Secondary | ICD-10-CM

## 2023-05-03 DIAGNOSIS — R0603 Acute respiratory distress: Secondary | ICD-10-CM | POA: Diagnosis not present

## 2023-05-03 DIAGNOSIS — R001 Bradycardia, unspecified: Secondary | ICD-10-CM | POA: Diagnosis not present

## 2023-05-03 DIAGNOSIS — Z6831 Body mass index (BMI) 31.0-31.9, adult: Secondary | ICD-10-CM | POA: Diagnosis not present

## 2023-05-03 DIAGNOSIS — R918 Other nonspecific abnormal finding of lung field: Secondary | ICD-10-CM | POA: Diagnosis not present

## 2023-05-03 DIAGNOSIS — E669 Obesity, unspecified: Secondary | ICD-10-CM | POA: Diagnosis present

## 2023-05-03 DIAGNOSIS — E1122 Type 2 diabetes mellitus with diabetic chronic kidney disease: Secondary | ICD-10-CM | POA: Diagnosis present

## 2023-05-03 DIAGNOSIS — N179 Acute kidney failure, unspecified: Secondary | ICD-10-CM | POA: Diagnosis present

## 2023-05-03 DIAGNOSIS — I161 Hypertensive emergency: Secondary | ICD-10-CM | POA: Diagnosis not present

## 2023-05-03 DIAGNOSIS — N183 Chronic kidney disease, stage 3 unspecified: Secondary | ICD-10-CM | POA: Diagnosis not present

## 2023-05-03 DIAGNOSIS — K449 Diaphragmatic hernia without obstruction or gangrene: Secondary | ICD-10-CM | POA: Diagnosis present

## 2023-05-03 DIAGNOSIS — Z79899 Other long term (current) drug therapy: Secondary | ICD-10-CM

## 2023-05-03 DIAGNOSIS — J9601 Acute respiratory failure with hypoxia: Secondary | ICD-10-CM

## 2023-05-03 DIAGNOSIS — Z808 Family history of malignant neoplasm of other organs or systems: Secondary | ICD-10-CM

## 2023-05-03 DIAGNOSIS — I130 Hypertensive heart and chronic kidney disease with heart failure and stage 1 through stage 4 chronic kidney disease, or unspecified chronic kidney disease: Secondary | ICD-10-CM | POA: Diagnosis present

## 2023-05-03 DIAGNOSIS — I5023 Acute on chronic systolic (congestive) heart failure: Secondary | ICD-10-CM | POA: Diagnosis present

## 2023-05-03 DIAGNOSIS — R079 Chest pain, unspecified: Secondary | ICD-10-CM | POA: Diagnosis present

## 2023-05-03 DIAGNOSIS — J811 Chronic pulmonary edema: Secondary | ICD-10-CM | POA: Diagnosis not present

## 2023-05-03 DIAGNOSIS — E119 Type 2 diabetes mellitus without complications: Secondary | ICD-10-CM | POA: Diagnosis not present

## 2023-05-03 DIAGNOSIS — Z87891 Personal history of nicotine dependence: Secondary | ICD-10-CM | POA: Diagnosis not present

## 2023-05-03 DIAGNOSIS — E785 Hyperlipidemia, unspecified: Secondary | ICD-10-CM | POA: Diagnosis present

## 2023-05-03 DIAGNOSIS — Z7982 Long term (current) use of aspirin: Secondary | ICD-10-CM

## 2023-05-03 DIAGNOSIS — E11649 Type 2 diabetes mellitus with hypoglycemia without coma: Secondary | ICD-10-CM | POA: Diagnosis present

## 2023-05-03 DIAGNOSIS — I214 Non-ST elevation (NSTEMI) myocardial infarction: Principal | ICD-10-CM | POA: Diagnosis present

## 2023-05-03 DIAGNOSIS — Z833 Family history of diabetes mellitus: Secondary | ICD-10-CM

## 2023-05-03 DIAGNOSIS — Z853 Personal history of malignant neoplasm of breast: Secondary | ICD-10-CM

## 2023-05-03 DIAGNOSIS — Z9011 Acquired absence of right breast and nipple: Secondary | ICD-10-CM

## 2023-05-03 DIAGNOSIS — R1110 Vomiting, unspecified: Secondary | ICD-10-CM | POA: Diagnosis present

## 2023-05-03 DIAGNOSIS — I10 Essential (primary) hypertension: Secondary | ICD-10-CM | POA: Diagnosis not present

## 2023-05-03 DIAGNOSIS — E876 Hypokalemia: Secondary | ICD-10-CM | POA: Diagnosis present

## 2023-05-03 DIAGNOSIS — E782 Mixed hyperlipidemia: Secondary | ICD-10-CM | POA: Diagnosis not present

## 2023-05-03 DIAGNOSIS — D649 Anemia, unspecified: Secondary | ICD-10-CM | POA: Diagnosis present

## 2023-05-03 DIAGNOSIS — I25110 Atherosclerotic heart disease of native coronary artery with unstable angina pectoris: Secondary | ICD-10-CM | POA: Diagnosis not present

## 2023-05-03 DIAGNOSIS — J449 Chronic obstructive pulmonary disease, unspecified: Secondary | ICD-10-CM | POA: Diagnosis not present

## 2023-05-03 DIAGNOSIS — J9621 Acute and chronic respiratory failure with hypoxia: Secondary | ICD-10-CM | POA: Diagnosis not present

## 2023-05-03 DIAGNOSIS — N184 Chronic kidney disease, stage 4 (severe): Secondary | ICD-10-CM | POA: Diagnosis not present

## 2023-05-03 DIAGNOSIS — Z888 Allergy status to other drugs, medicaments and biological substances status: Secondary | ICD-10-CM

## 2023-05-03 DIAGNOSIS — I160 Hypertensive urgency: Secondary | ICD-10-CM | POA: Insufficient documentation

## 2023-05-03 DIAGNOSIS — I700 Atherosclerosis of aorta: Secondary | ICD-10-CM | POA: Diagnosis not present

## 2023-05-03 DIAGNOSIS — E1121 Type 2 diabetes mellitus with diabetic nephropathy: Secondary | ICD-10-CM | POA: Diagnosis not present

## 2023-05-03 DIAGNOSIS — N189 Chronic kidney disease, unspecified: Secondary | ICD-10-CM | POA: Diagnosis present

## 2023-05-03 HISTORY — DX: Non-ST elevation (NSTEMI) myocardial infarction: I21.4

## 2023-05-03 LAB — HEMOGLOBIN A1C
Hgb A1c MFr Bld: 5.4 % (ref 4.8–5.6)
Mean Plasma Glucose: 108.28 mg/dL

## 2023-05-03 LAB — TROPONIN I (HIGH SENSITIVITY): Troponin I (High Sensitivity): 4223 ng/L (ref <18)

## 2023-05-03 LAB — HEPARIN LEVEL (UNFRACTIONATED): Heparin Unfractionated: 0.3 [IU]/mL (ref 0.30–0.70)

## 2023-05-03 MED ORDER — SODIUM BICARBONATE 650 MG PO TABS
650.0000 mg | ORAL_TABLET | Freq: Every day | ORAL | Status: DC
  Administered 2023-05-04 – 2023-05-09 (×6): 650 mg via ORAL
  Filled 2023-05-03 (×6): qty 1

## 2023-05-03 MED ORDER — VITAMIN D 25 MCG (1000 UNIT) PO TABS
5000.0000 [IU] | ORAL_TABLET | Freq: Every day | ORAL | Status: DC
  Administered 2023-05-04 – 2023-05-12 (×9): 5000 [IU] via ORAL
  Filled 2023-05-03 (×9): qty 5

## 2023-05-03 MED ORDER — ALUM & MAG HYDROXIDE-SIMETH 200-200-20 MG/5ML PO SUSP
30.0000 mL | Freq: Once | ORAL | Status: AC
  Administered 2023-05-03: 30 mL via ORAL
  Filled 2023-05-03: qty 30

## 2023-05-03 MED ORDER — ACETAMINOPHEN 325 MG PO TABS
650.0000 mg | ORAL_TABLET | ORAL | Status: DC | PRN
  Administered 2023-05-11: 650 mg via ORAL
  Filled 2023-05-03 (×2): qty 2

## 2023-05-03 MED ORDER — INSULIN ASPART 100 UNIT/ML IJ SOLN
0.0000 [IU] | Freq: Three times a day (TID) | INTRAMUSCULAR | Status: DC
  Administered 2023-05-06: 2 [IU] via SUBCUTANEOUS
  Administered 2023-05-06: 5 [IU] via SUBCUTANEOUS
  Administered 2023-05-06: 2 [IU] via SUBCUTANEOUS
  Administered 2023-05-07 – 2023-05-08 (×3): 3 [IU] via SUBCUTANEOUS
  Administered 2023-05-08 – 2023-05-09 (×3): 2 [IU] via SUBCUTANEOUS
  Administered 2023-05-09: 3 [IU] via SUBCUTANEOUS
  Administered 2023-05-10 (×2): 2 [IU] via SUBCUTANEOUS
  Administered 2023-05-11 (×2): 3 [IU] via SUBCUTANEOUS
  Administered 2023-05-11: 2 [IU] via SUBCUTANEOUS
  Administered 2023-05-12: 3 [IU] via SUBCUTANEOUS
  Administered 2023-05-12: 2 [IU] via SUBCUTANEOUS

## 2023-05-03 MED ORDER — NITROGLYCERIN IN D5W 200-5 MCG/ML-% IV SOLN
0.0000 ug/min | INTRAVENOUS | Status: DC
  Administered 2023-05-03: 5 ug/min via INTRAVENOUS
  Administered 2023-05-05: 55 ug/min via INTRAVENOUS
  Administered 2023-05-07: 40 ug/min via INTRAVENOUS
  Administered 2023-05-09 – 2023-05-10 (×2): 30 ug/min via INTRAVENOUS
  Filled 2023-05-03 (×7): qty 250

## 2023-05-03 MED ORDER — METOPROLOL SUCCINATE ER 50 MG PO TB24
50.0000 mg | ORAL_TABLET | Freq: Two times a day (BID) | ORAL | Status: DC
  Administered 2023-05-03 – 2023-05-12 (×17): 50 mg via ORAL
  Filled 2023-05-03 (×17): qty 1

## 2023-05-03 MED ORDER — NITROGLYCERIN 0.4 MG SL SUBL
0.4000 mg | SUBLINGUAL_TABLET | SUBLINGUAL | Status: DC | PRN
  Administered 2023-05-09: 0.4 mg via SUBLINGUAL
  Filled 2023-05-03: qty 1

## 2023-05-03 MED ORDER — ONDANSETRON HCL 4 MG/2ML IJ SOLN
4.0000 mg | Freq: Four times a day (QID) | INTRAMUSCULAR | Status: DC | PRN
  Administered 2023-05-03: 4 mg via INTRAVENOUS
  Filled 2023-05-03 (×2): qty 2

## 2023-05-03 MED ORDER — HEPARIN (PORCINE) 25000 UT/250ML-% IV SOLN
1400.0000 [IU]/h | INTRAVENOUS | Status: DC
  Administered 2023-05-07: 1400 [IU]/h via INTRAVENOUS
  Administered 2023-05-07: 1250 [IU]/h via INTRAVENOUS
  Administered 2023-05-08: 1400 [IU]/h via INTRAVENOUS
  Filled 2023-05-03 (×5): qty 250

## 2023-05-03 MED ORDER — PANTOPRAZOLE SODIUM 40 MG PO TBEC
40.0000 mg | DELAYED_RELEASE_TABLET | Freq: Every day | ORAL | Status: DC
  Administered 2023-05-03 – 2023-05-12 (×10): 40 mg via ORAL
  Filled 2023-05-03 (×10): qty 1

## 2023-05-03 MED ORDER — ASPIRIN 81 MG PO TBEC
81.0000 mg | DELAYED_RELEASE_TABLET | Freq: Every day | ORAL | Status: DC
  Administered 2023-05-04 – 2023-05-05 (×2): 81 mg via ORAL
  Filled 2023-05-03 (×2): qty 1

## 2023-05-03 MED ORDER — NITROGLYCERIN 0.4 MG SL SUBL
0.4000 mg | SUBLINGUAL_TABLET | SUBLINGUAL | Status: DC | PRN
  Administered 2023-05-03: 0.4 mg via SUBLINGUAL

## 2023-05-03 MED ORDER — NITROGLYCERIN 0.4 MG SL SUBL
SUBLINGUAL_TABLET | SUBLINGUAL | Status: AC
  Administered 2023-05-03: 0.4 mg via SUBLINGUAL
  Filled 2023-05-03: qty 1

## 2023-05-03 MED ORDER — FLUTICASONE FUROATE-VILANTEROL 100-25 MCG/ACT IN AEPB
1.0000 | INHALATION_SPRAY | Freq: Every day | RESPIRATORY_TRACT | Status: DC
  Administered 2023-05-04 – 2023-05-12 (×8): 1 via RESPIRATORY_TRACT
  Filled 2023-05-03 (×2): qty 28

## 2023-05-03 NOTE — Progress Notes (Signed)
PHARMACY - ANTICOAGULATION CONSULT NOTE  Pharmacy Consult:  Heparin  Indication: chest pain/ACS  No Known Allergies  Patient Measurements: Height: 5\' 5"  (165.1 cm) Weight: 86.1 kg (189 lb 14.4 oz) IBW/kg (Calculated) : 57 Heparin Dosing Weight: 75 kg  Vital Signs: Temp: 97.9 F (36.6 C) (10/25 1910) Temp Source: Oral (10/25 1910) BP: 153/62 (10/25 2100) Pulse Rate: 82 (10/25 1910)  Labs: No results for input(s): "HGB", "HCT", "PLT", "APTT", "LABPROT", "INR", "HEPARINUNFRC", "HEPRLOWMOCWT", "CREATININE", "CKTOTAL", "CKMB", "TROPONINIHS" in the last 72 hours.  CrCl cannot be calculated (Patient's most recent lab result is older than the maximum 21 days allowed.).   Medical History: Past Medical History:  Diagnosis Date   Chronic kidney disease    Diabetes (HCC)    Hypertension       Assessment: 41 YOF presented to [facility] with chest pain.  She was started on IV heparin since 10/24 and was therapeutic per discussion with pharmacist at [facility].  CBC stable; no bleeding reported.  Confirmed with [facility]'s RN that heparin was infusing on patient's arrival.  Goal of Therapy:  Heparin level 0.3-0.7 units/ml Monitor platelets by anticoagulation protocol: Yes   Plan: Continue heparin infusion at 850 units/hr Check heparin level Daily heparin level and CBC  Abbegayle Denault D. Laney Potash, PharmD, BCPS, BCCCP 05/03/2023, 9:54 PM

## 2023-05-03 NOTE — H&P (Signed)
Cardiology Admission History and Physical   Patient ID: Amber [name] MRN: [id number]; DOB: October 26, 1945   Admission date: 05/03/2023  PCP:  Abner Greenspan, MD   [facility] Providers Cardiologist:  None        Chief Complaint:  chest pain and shortness of breath  Patient Profile:   Amber [name] is a 77 y.o. female with with a history of mild non-obstructive CAD on ca cardiac catheterization in 08/2017, hypertension, hyperlipidemia, type 2 diabetes mellitus, CKD stage III with solitary kidney, GERD, hiatal hernia, chronic anemia, right breast cancer s/p mastectomy in 2020, and obesity who is being seen 05/03/2023 for the evaluation of chest pain.  History of Present Illness:   Amber [name] is a 77 year old female with the above history.  She has a history of mild nonobstructive CAD on a cardiac catheterization in 08/2017.  He was recently seen in the office by Dr. Jacklynn Ganong on 05/01/2019 for which time she reported worsening symptoms of chest discomfort for the last couple months that sounded atypical and more related to reflux secondary to her hiatal hernia.  However, she also describes chest venous that radiated to both shoulders with minimal ambulation with day-to-day activities at home which sounded more cardiac in nature.  She also reported significant dyspnea on exertion that worsened over the past couple months as well as a 20 pound weight gain.  Therefore, she was sent to the ED for further evaluation.  She presented to the [facility] ED on 05/01/2019 for and was noted to be hypertensive with systolic BP in the 200s. EKG reportedly showed sinus bradycardia with no acute ST/T changes. Troponin I negative x5. Pro BNP 652. WBC 8.5, Hgb 11.4, Plts 191. Na 140, K 3.6, Glucose 111, BUN 37, Cr 2.30. AST 17, ALT 13, Alk Phos 99, Total Bili 0.4. Lactic acid 0.7. Procalcitonin <0.05. She was treated with IV Hydralazine and IV Lasix in the ED and admitted. Echo  showed LVEF of 45-50% with impaired relaxation pattern, normal RV size and function, mild mitral regurgitation, and mildly elevated PASP of 39 mmHg.  Myoview which showed reversible ischemia suggesting a possible LAD occlusion. Therefore, she was transferred to [facility] for cardiac catheterization.   Patient reports chest pain and shortness of breath for the past 2-3 months at least. She describes the pain a a tightness in the center of her chest that radiates to both arms and her back. She states the pain is usually worse when laying back or if she is doing any activity. It is also typically worse with meals. Duration of pain varies but she states it usually goes away if she sits up straight and rests. She reports dyspnea no exertion with minimal activity such as walking short distances or bending over to put her clothes on. She has been sleeping on an incline for years but it is hard to tell whether this is because of reflux or because of orthopnea. No PND. She has chronic edema but this is stable. No palpitations. She reports some lightheadedness and dizziness when the pain is severe but no syncope. She also reports nausea with the pain.   Upon arrival to [facility], she was complaining of 10/17/2022. Repeat EKG showed normal sinus rhythm with Q waves in lead III and V1-V3 as well as a mildly elevated ST elevated with in V2 which was new compared to EKG from 05/01/2023. She also vomiting on arrival. RN said she initially felt better after vomiting. However,  by the time I got to her room, pain had worsened. She looked uncomfortable in the bed and described the pain as a 8/10. She states she has been having pain basically since her Myoview yesterday (although not this bad). The only thing that seemed to help at [facility] was sublingual Nitroglycerin and IV Morphine. We repeated EKG which was stable. We gave 2 dose of sublingual Nitroglycerin and pain improved to a 2/10.    Past Medical History:  Diagnosis  Date   Chronic kidney disease    Diabetes (HCC)    Hypertension     Past Surgical History:  Procedure Laterality Date   BACK SURGERY     x 2   BREAST BIOPSY Right    x3     Medications Prior to Admission: Prior to Admission medications   Medication Sig Start Date End Date Taking? Authorizing Provider  amLODipine (NORVASC) 5 MG tablet Take 5 mg by mouth daily. 01/19/21   [provider]  anastrozole (ARIMIDEX) 1 MG tablet TAKE 1 TABLET EVERY DAY Patient not taking: Reported on 08/25/2021 06/14/21   Dellia Beckwith, MD  aspirin 81 MG EC tablet Take 81 mg by mouth daily.    [provider]  Cholecalciferol 125 MCG (5000 UT) capsule Take 5,000 Units by mouth daily.    [provider]  doxazosin (CARDURA) 2 MG tablet Take 2 mg by mouth daily. 11/01/20   [provider]  fluticasone furoate-vilanterol (BREO ELLIPTA) 100-25 MCG/INH AEPB Inhale 1 puff into the lungs daily.    [provider]  furosemide (LASIX) 40 MG tablet Take 40 mg by mouth daily. 10/28/20   [provider]  glimepiride (AMARYL) 2 MG tablet Take 2 mg by mouth daily with breakfast. 11/01/20   [provider]  hydrALAZINE (APRESOLINE) 50 MG tablet Take 50 mg by mouth 3 (three) times daily. 01/02/23   [provider]  losartan (COZAAR) 100 MG tablet Take 100 mg by mouth daily. 02/18/23   [provider]  metoprolol succinate (TOPROL-XL) 50 MG 24 hr tablet Take 50 mg by mouth 2 (two) times daily. 11/01/20   [provider]  omeprazole (PRILOSEC) 40 MG capsule Take 40 mg by mouth 2 (two) times daily. 11/01/20   [provider]  pioglitazone (ACTOS) 15 MG tablet Take 15 mg by mouth daily. 11/19/20   [provider]  potassium chloride (KLOR-CON) 10 MEQ tablet Take 10 mEq by mouth 2 (two) times daily. 08/24/21   [provider]  pravastatin (PRAVACHOL) 40 MG tablet Take 80 mg by mouth daily. 10/28/20   [provider]  sodium bicarbonate 650 MG tablet Take 650 mg by mouth daily. 04/24/23   [provider]  traMADol (ULTRAM) 50 MG tablet Take 1 tablet (50 mg total) by mouth every 6 (six) hours as needed. Patient not taking: Reported on 04/18/2021 03/20/21   Pascal Lux, NP     Allergies:   No Known Allergies  Social History:   Social History   Socioeconomic History   Marital status: Married    Spouse name: Not on file   Number of children: Not on file   Years of education: Not on file   Highest education level: Not on file  Occupational History   Not on file  Tobacco Use   Smoking status: Former    Current packs/day: 0.00    Average packs/day: 1 pack/day for 50.0 years (50.0 ttl pk-yrs)    Types: Cigarettes  Start date: 1964    Quit date: 2014    Years since quitting: 10.8   Smokeless tobacco: Never  Vaping Use   Vaping status: Never Used  Substance and Sexual Activity   Alcohol use: Not Currently   Drug use: Not Currently   Sexual activity: Not Currently  Other Topics Concern   Not on file  Social History Narrative   Not on file   Social Determinants of Health   Financial Resource Strain: Not on file  Food Insecurity: Not on file  Transportation Needs: Not on file  Physical Activity: Not on file  Stress: Not on file  Social Connections: Not on file  Intimate Partner Violence: Not on file    Family History:   The patient's family history includes Diabetes in her sister; Healthy in her son and son; Heart disease in her father and sister; Kidney disease in her sister; Throat cancer in her mother.    ROS:  Please see the history of present illness.  All other ROS reviewed and negative.     Physical Exam/Data:   Vitals:   05/03/23 2045 05/03/23 2055 05/03/23 2100 05/03/23 2117  BP: 136/70 (!) 141/65 (!) 153/62   Pulse:      Resp: 20 16 14 18   Temp:      TempSrc:      SpO2: 100% 98% 98%   Weight:      Height:       No intake or output  data in the 24 hours ending 05/03/23 2134    05/03/2023    7:10 PM 05/01/2023    1:00 PM 03/08/2023    3:10 PM  Last 3 Weights  Weight (lbs) 189 lb 14.4 oz 188 lb 184 lb 9.6 oz  Weight (kg) 86.138 kg 85.276 kg 83.734 kg     Body mass index is 31.6 kg/m.  General: 77 y.o. Caucasian female resting comfortably in no acute distress. HEENT: Normocephalic and atraumatic. Sclera clear. Neck: Supple. No carotid bruits. No JVD. Heart: RRR. Distinct S1 and S2. No murmurs, gallops, or rubs.  Lungs: No increased work of breathing. Clear to ausculation bilaterally. No wheezes, rhonchi, or rales.  Abdomen: Soft, non-distended, and non-tender to palpation.  Extremities: Trace lower extremity edema bilaterally.    Skin: Warm and dry. Neuro: Alert and oriented x3. No focal deficits. Psych: Normal affect. Responds appropriately.    EKG:  The ECG that was done was personally reviewed and demonstrates normal sinus rhythm, rate 96 bpm, with PVCs and possible Q waves in leads III and V1-V3 as well as mildly elevated St elevation in lead V1 and V2 not consistent with STEMI but new from 05/01/2023.  Relevant CV Studies:  Left Cardiac Catheterization 08/16/2017 ([facility]):   Ost LAD to Prox LAD lesion is 50% stenosed.   Ost Cx to Prox Cx lesion is 30% stenosed.   Mid RCA lesion is 40% stenosed.    Laboratory Data:  High Sensitivity Troponin:  No results for input(s): "TROPONINIHS" in the last 720 hours.    ChemistryNo results for input(s): "NA", "K", "CL", "CO2", "GLUCOSE", "BUN", "CREATININE", "CALCIUM", "MG", "GFRNONAA", "GFRAA", "ANIONGAP" in the last 168 hours.  No results for input(s): "PROT", "ALBUMIN", "AST", "ALT", "ALKPHOS", "BILITOT" in the last 168 hours. Lipids No results for input(s): "CHOL", "TRIG", "HDL", "LABVLDL", "LDLCALC", "CHOLHDL" in the last 168 hours. HematologyNo results for input(s): "WBC", "RBC", "HGB", "HCT", "MCV", "MCH", "MCHC", "RDW", "PLT" in the  last 168 hours. Thyroid No  results for input(s): "TSH", "FREET4" in the last 168 hours. BNPNo results for input(s): "BNP", "PROBNP" in the last 168 hours.  DDimer No results for input(s): "DDIMER" in the last 168 hours.   Radiology/Studies:  No results found.   Assessment and Plan:   Chest Pain CAD Patient has a history of non-obstructive CAD noted on cardiac catheterization in 08/2017 with 50% stenosis of ostial to proximal LAD, 30% stenosis of ostial to proximal LCX, and 40% stenosis of mid RCA noted at that time. She presented to [facility] from the Cardiology office for further evaluation of chest pain and shortness of breath with exertion for the last 2-3 months as least. EKG reportedly showed sinus bradycardia with no acute ischemic changes. Troponin I negative x5. Echo showed LVEF of 45-50%. Myoview showed findings concerning for reversible ischemia in LAD distribution. Therefore, she was transferred to [facility] for cardiac catheterization when renal function allows.  - Patient was having 8/10 pain after arrival to [facility]. Improved to 2/10 after 2 doses of sublingual Nitroglycerin. Some of patient chest sounds concerning for angina and some sound GI in nature. However, repeat EKG here shows mild ST elevation in V1 and V2 no consistent with ischemic but new compared to EKG from 05/01/2023.  - Will check high-sensitivity troponin.  - Continue IV Heparin.  - Will start IV Nitroglycerin given improvement with sublingual Nitro. - Continue Aspirin 81mg  daily.  - Continue home Toprol-XL twice daily. - Will stop home Pravastatin and start Lipitor 40mg  daily. - Will also given Protonix and GI cocktail for potential GI component. - She was transferred her for cardiac catheterization. Unfortunately, creatinine 2.60 today (baseline around 2.1). Will hold ARB and monitor over the weekend. Can consult Nephrology in the morning. If renal function stabilized, can consider cardiac catheterization  on Monday.   Chronic HFmrEF BNP was elevated at 653 at [facility]. Chest x-ray showed no acute findings. Echo showed  LVEF of 45-50% with impaired relaxation pattern, normal RV size and function, mild mitral regurgitation, and mildly elevated PASP of 39 mmHg.  She was given one dose of IV Lasix at [facility] but then was treated with fluids given worsening renal function.  - She does not appear significantly volume overloaded on exam.  - No need for additional diuresis right now. - Will hold home Losartan given renal function.  - Continue home Toprol-XL 50mg  twice daily.  - Will hold home Hydralazine while she is on the Nitro drip.  Hypertensive Urgency Systolic BP >200 on arrival. BP still markedly elevated with systolic BP as high as the 170s to 180s since arriving to [facility]. However, quickly improved after 2 doses of sublingual Nitroglycerin.  - Will start IV Nitroglycerin.  - Continue Toprol-XL as above. - Will hold home Amlodipine, Hydralazine, and Doxazosin while she is on the Nitro drip. - Will hold home Losartan given renal function.  Hyperlipidemia Lipid panel at [facility]: Total Cholesterol 121, Triglycerides 152, HDL 33, LDL 57.  - Will stop home Pravastatin and start Lipitor 40mg  daily. - Will recheck fasting lipid panel in the morning.  Type 2 Diabetes Mellitus Hemoglobin A1c at [facility] 5.0%. Of note, she did have a severe hypoglycemic episode one night at [facility] with glucose dropping to 37 requiring D5 half normal saline. - On Glimepiride and Pioglitozone at home. Will hold and place on sliding scale while here.  AKI on CKD Stage IIIb Baseline creatinine is around 2.1.  - Creatinine 2.6 today.  - Will hold ARB.  -  Will hold off on fluids for now.  - Can consult Nephrology in the morning.  Hiatal Hernia GERD  Patient has a history of GERD and hiatal hernia that is being treated medically. She states last EGD was about 1 year ago.  - Some of patients chest pain symtpoms  sound GI related.  - She is on Omeprazole at home. Will start Protonix 40mg  daily here.  - Will also give GI cocktail.   COPD - Continue home inhalers.   Chronic Normocytic Anemia Hemoglobin stable at 11.1 today.  - Will continue to monitor.    Risk Assessment/Risk Scores:   TIMI Risk Score for Unstable Angina or Non-ST Elevation MI:   The patient's TIMI risk score is 5, which indicates a 26% risk of all cause mortality, new or recurrent myocardial infarction or need for urgent revascularization in the next 14 days.{   New York Heart Association (NYHA) Functional Class NYHA Class III   Code Status: Full Code  Severity of Illness: The appropriate patient status for this patient is INPATIENT. Inpatient status is judged to be reasonable and necessary in order to provide the required intensity of service to ensure the patient's safety. The patient's presenting symptoms, physical exam findings, and initial radiographic and laboratory data in the context of their chronic comorbidities is felt to place them at high risk for further clinical deterioration. Furthermore, it is not anticipated that the patient will be medically stable for discharge from the hospital within 2 midnights of admission.   * I certify that at the point of admission it is my clinical judgment that the patient will require inpatient hospital care spanning beyond 2 midnights from the point of admission due to high intensity of service, high risk for further deterioration and high frequency of surveillance required.*   For questions or updates, please contact [facility] Please consult [email or web address] for contact info under     Signed, Corrin Parker, PA-C  05/03/2023 9:34 PM

## 2023-05-03 NOTE — Progress Notes (Signed)
Pt admitted from [facility] with IV Heparin infusing at 8.5 ml/hr via PIV on the left forearm. Pt complains pressure squeezing chest pain with pain scale 4/10 that radiates to her both arms and mid back associated with nausea and vomiting. Pt denies shortness of breath.   Dr. Irene Limbo (cardiology on call) was notified of the admission. Will continue to monitor pt.     05/03/23 1910  Vitals  Temp 97.9 F (36.6 C)  Temp Source Oral  BP (!) 164/80  MAP (mmHg) 90  BP Location Left Arm  BP Method Automatic  Patient Position (if appropriate) Sitting  Pulse Rate 82  Pulse Rate Source Monitor  ECG Heart Rate 79  Resp 18  Level of Consciousness  Level of Consciousness Alert  MEWS COLOR  MEWS Score Color Green  Oxygen Therapy  SpO2 98 %  O2 Device Room Air  Pain Assessment  Pain Scale 0-10  Pain Score 4  Pain Type Acute pain  Pain Location Chest  Pain Orientation Mid  Pain Radiating Towards across the chest, both arms and mid back  Pain Descriptors / Indicators Pressure;Squeezing  Pain Frequency Intermittent  Pain Onset On-going  Patients Stated Pain Goal 0  Pain Intervention(s) MD notified (Comment) (Dr. Irene Limbo)  Multiple Pain Sites No  Complaints & Interventions  Complains of Nausea /  Vomiting;Other (Comment) (CP)  Interventions Other (comment) (pt came with IV Heparin at 8.5 ml/hr)  Nausea relieved by Other (Comment) (awaiting for the MD's order)  Height and Weight  Height 5\' 5"  (1.651 m)  Weight 86.1 kg  Type of Scale Used Standing  Type of Weight Actual  BSA (Calculated - sq m) 1.99 sq meters  BMI (Calculated) 31.6  Weight in (lb) to have BMI = 25 149.9  MEWS Score  MEWS Temp 0  MEWS Systolic 0  MEWS Pulse 0  MEWS RR 0  MEWS LOC 0  MEWS Score 0  Provider Notification  Provider Name/Title Dr. Irene Limbo  Date Provider Notified 05/03/23  Time Provider Notified 1910  Method of Notification Page  Notification Reason Other (Comment) (admission from  [facility])  Provider response Other (Comment) (acknowledge the admission and will come to see the pt)  Date of Provider Response 05/03/23  Time of Provider Response 1919

## 2023-05-04 DIAGNOSIS — I200 Unstable angina: Secondary | ICD-10-CM | POA: Diagnosis not present

## 2023-05-04 LAB — COMPREHENSIVE METABOLIC PANEL
ALT: 15 U/L (ref 0–44)
AST: 33 U/L (ref 15–41)
Albumin: 3.6 g/dL (ref 3.5–5.0)
Alkaline Phosphatase: 70 U/L (ref 38–126)
Anion gap: 8 (ref 5–15)
BUN: 46 mg/dL — ABNORMAL HIGH (ref 8–23)
CO2: 22 mmol/L (ref 22–32)
Calcium: 8.8 mg/dL — ABNORMAL LOW (ref 8.9–10.3)
Chloride: 107 mmol/L (ref 98–111)
Creatinine, Ser: 2.65 mg/dL — ABNORMAL HIGH (ref 0.44–1.00)
GFR, Estimated: 18 mL/min — ABNORMAL LOW (ref >60)
Glucose, Bld: 128 mg/dL — ABNORMAL HIGH (ref 70–99)
Potassium: 4.6 mmol/L (ref 3.5–5.1)
Sodium: 137 mmol/L (ref 135–145)
Total Bilirubin: 0.8 mg/dL (ref 0.3–1.2)
Total Protein: 5.6 g/dL — ABNORMAL LOW (ref 6.5–8.1)

## 2023-05-04 LAB — CBC
HCT: 32.1 % — ABNORMAL LOW (ref 36.0–46.0)
Hemoglobin: 10 g/dL — ABNORMAL LOW (ref 12.0–15.0)
MCH: 26.7 pg (ref 26.0–34.0)
MCHC: 31.2 g/dL (ref 30.0–36.0)
MCV: 85.8 fL (ref 80.0–100.0)
Platelets: 215 10*3/uL (ref 150–400)
RBC: 3.74 MIL/uL — ABNORMAL LOW (ref 3.87–5.11)
RDW: 14.7 % (ref 11.5–15.5)
WBC: 9.4 10*3/uL (ref 4.0–10.5)
nRBC: 0 % (ref 0.0–0.2)

## 2023-05-04 LAB — BRAIN NATRIURETIC PEPTIDE: B Natriuretic Peptide: 996.7 pg/mL — ABNORMAL HIGH (ref 0.0–100.0)

## 2023-05-04 LAB — GLUCOSE, CAPILLARY
Glucose-Capillary: 139 mg/dL — ABNORMAL HIGH (ref 70–99)
Glucose-Capillary: 112 mg/dL — ABNORMAL HIGH (ref 70–99)
Glucose-Capillary: 123 mg/dL — ABNORMAL HIGH (ref 70–99)
Glucose-Capillary: 134 mg/dL — ABNORMAL HIGH (ref 70–99)

## 2023-05-04 LAB — TROPONIN I (HIGH SENSITIVITY): Troponin I (High Sensitivity): 3862 ng/L (ref <18)

## 2023-05-04 LAB — HEPARIN LEVEL (UNFRACTIONATED)
Heparin Unfractionated: 0.15 [IU]/mL — ABNORMAL LOW (ref 0.30–0.70)
Heparin Unfractionated: 0.36 [IU]/mL (ref 0.30–0.70)

## 2023-05-04 MED ORDER — HEPARIN BOLUS VIA INFUSION
2200.0000 [IU] | Freq: Once | INTRAVENOUS | Status: AC
  Administered 2023-05-04: 2200 [IU] via INTRAVENOUS
  Filled 2023-05-04: qty 2200

## 2023-05-04 MED ORDER — ORAL CARE MOUTH RINSE: 15.0000 mL | OROMUCOSAL | Status: DC | PRN

## 2023-05-04 NOTE — Progress Notes (Signed)
PHARMACY - ANTICOAGULATION CONSULT NOTE  Pharmacy Consult for heparin Indication: chest pain/ACS  Labs: Recent Labs    05/03/23 2224  HEPARINUNFRC 0.30  TROPONINIHS 4,223*    Assessment: 77yo female therapeutic on heparin continued from OSH but at very low end of goal; no infusion issues or signs of bleeding per RN.  Goal of Therapy:  Heparin level 0.3-0.7 units/ml   Plan:  Increase heparin infusion slightly to 900 units/hr. Check level with am labs.   Vernard Gambles, PharmD, BCPS 05/04/2023 12:12 AM

## 2023-05-04 NOTE — Progress Notes (Signed)
PHARMACY - ANTICOAGULATION CONSULT NOTE  Pharmacy Consult for heparin Indication: chest pain/ACS  No Known Allergies  Patient Measurements: Height: 5\' 5"  (165.1 cm) Weight: 86.1 kg (189 lb 14.4 oz) IBW/kg (Calculated) : 57 Heparin Dosing Weight: 76 kg  Vital Signs: Temp: 98.2 F (36.8 C) (10/26 0319) Temp Source: Oral (10/26 0319) BP: 129/53 (10/26 0319) Pulse Rate: 69 (10/26 0319)  Labs: Recent Labs    05/03/23 2224 05/04/23 0017  HEPARINUNFRC 0.30  --   TROPONINIHS 4,223* 3,862*    CrCl cannot be calculated (Patient's most recent lab result is older than the maximum 21 days allowed.).   Medical History: Past Medical History:  Diagnosis Date   Chronic kidney disease    Diabetes (HCC)    Hypertension     Medications:  Scheduled:   aspirin EC  81 mg Oral Daily   cholecalciferol  5,000 Units Oral Daily   fluticasone furoate-vilanterol  1 puff Inhalation Daily   insulin aspart  0-15 Units Subcutaneous TID WC   metoprolol succinate  50 mg Oral BID   pantoprazole  40 mg Oral Daily   sodium bicarbonate  650 mg Oral Daily   Infusions:   heparin 900 Units/hr (05/04/23 0015)   nitroGLYCERIN 55 mcg/min (05/03/23 2306)    Assessment: 77 yo female presented to [facility] with chest pain. Started on IV heparin on 10/24 and was therapeutic per discussion with pharmacist at [facility]. Considering cardiac cath on 10/28 if renal function stabilizes  Heparin level subtherapeutic at 0.15 on 900 units/hr. Per RN, patient can only be stuck in 1 arm. Heparin infusion paused briefly for level. No other issues or pauses with infusion noted or signs of bleeding.    Goal of Therapy:  Heparin level 0.3-0.7 units/ml Monitor platelets by anticoagulation protocol: Yes   Plan:  Heparin 2200 unit x 1 bolus  Increase heparin to 1150 units/hr  8 hour heparin level  Daily CBC and heparin level while on heparin  Monitor for s/sx of bleeding   Lorina Duffner I Wylodean Shimmel 05/04/2023,7:42  AM

## 2023-05-04 NOTE — Plan of Care (Signed)
  Problem: Cardiac: Goal: Ability to achieve and maintain adequate cardiovascular perfusion will improve Outcome: Progressing   

## 2023-05-04 NOTE — Plan of Care (Signed)

## 2023-05-04 NOTE — Progress Notes (Signed)
   Progress Note  Patient Name: Amber [name] Date of Encounter: 05/04/2023  Primary Cardiologist: None   Subjective   Chest pain resolved with IV ntg.  Inpatient Medications    Scheduled Meds:  aspirin EC  81 mg Oral Daily   cholecalciferol  5,000 Units Oral Daily   fluticasone furoate-vilanterol  1 puff Inhalation Daily   heparin  2,200 Units Intravenous Once   insulin aspart  0-15 Units Subcutaneous TID WC   metoprolol succinate  50 mg Oral BID   pantoprazole  40 mg Oral Daily   sodium bicarbonate  650 mg Oral Daily   Continuous Infusions:  heparin 900 Units/hr (05/04/23 0015)   nitroGLYCERIN 55 mcg/min (05/03/23 2306)   PRN Meds: acetaminophen, nitroGLYCERIN, ondansetron (ZOFRAN) IV   Vital Signs    Vitals:   05/03/23 2117 05/03/23 2228 05/04/23 0319 05/04/23 0752  BP:  (!) 152/68 (!) 129/53   Pulse:  87 69 61  Resp: 18  16 16   Temp:   98.2 F (36.8 C)   TempSrc:   Oral   SpO2:   98% 100%  Weight:      Height:       No intake or output data in the 24 hours ending 05/04/23 0949 Filed Weights   05/03/23 1910  Weight: 86.1 kg    Telemetry    Nsr with PVC's - Personally Reviewed  ECG    none - Personally Reviewed  Physical Exam   GEN: No acute distress.   Neck: No JVD Cardiac: IRRR, no murmurs, rubs, or gallops.  Respiratory: Clear to auscultation bilaterally. GI: Soft, nontender, non-distended  MS: No edema; No deformity. Neuro:  Nonfocal  Psych: Normal affect   Labs    Chemistry Recent Labs  Lab 05/04/23 0732  NA 137  K 4.6  CL 107  CO2 22  GLUCOSE 128*  BUN 46*  CREATININE 2.65*  CALCIUM 8.8*  PROT 5.6*  ALBUMIN 3.6  AST 33  ALT 15  ALKPHOS 70  BILITOT 0.8  GFRNONAA 18*  ANIONGAP 8     Hematology Recent Labs  Lab 05/04/23 0732  WBC 9.4  RBC 3.74*  HGB 10.0*  HCT 32.1*  MCV 85.8  MCH 26.7  MCHC 31.2  RDW 14.7  PLT 215    Cardiac EnzymesNo results for input(s): "TROPONINI" in the last 168 hours. No  results for input(s): "TROPIPOC" in the last 168 hours.   BNP Recent Labs  Lab 05/04/23 0732  BNP 996.7*     DDimer No results for input(s): "DDIMER" in the last 168 hours.   Radiology    No results found.  Cardiac Studies   none  Patient Profile     77 y.o. female admitted in transfer for heart cath with wall motion abnormalities and positive stress test.   Assessment & Plan    [place] - she will continue IV heparin and NTG and undergo left heart cath on Monday. Acute systolic heart failure - her dyspnea is improved. Continue her current meds.  Acute on chronic renal insuff with a solitary kidney - she will need hydration pre cath starting tomorrow.     For questions or updates, please contact [facility] Please consult [email or web address] for contact info under Cardiology/STEMI.      Signed, Lewayne Bunting, MD  05/04/2023, 9:49 AM

## 2023-05-04 NOTE — Progress Notes (Signed)
PHARMACY - ANTICOAGULATION CONSULT NOTE  Pharmacy Consult for heparin Indication: chest pain/ACS  No Known Allergies  Patient Measurements: Height: 5\' 5"  (165.1 cm) Weight: 86.1 kg (189 lb 14.4 oz) IBW/kg (Calculated) : 57 Heparin Dosing Weight: 76 kg  Vital Signs: Temp: 98.3 F (36.8 C) (10/26 1932) Temp Source: Oral (10/26 1932) BP: 158/61 (10/26 1932) Pulse Rate: 64 (10/26 1932)  Labs: Recent Labs    05/03/23 2224 05/04/23 0017 05/04/23 0732 05/04/23 1853  HGB  --   --  10.0*  --   HCT  --   --  32.1*  --   PLT  --   --  215  --   HEPARINUNFRC 0.30  --  0.15* 0.36  CREATININE  --   --  2.65*  --   TROPONINIHS 4,223* 3,862*  --   --     Estimated Creatinine Clearance: 18.6 mL/min (A) (by C-G formula based on SCr of 2.65 mg/dL (H)).   Medical History: Past Medical History:  Diagnosis Date   Chronic kidney disease    Diabetes (HCC)    Hypertension     Medications:  Scheduled:   aspirin EC  81 mg Oral Daily   cholecalciferol  5,000 Units Oral Daily   fluticasone furoate-vilanterol  1 puff Inhalation Daily   insulin aspart  0-15 Units Subcutaneous TID WC   metoprolol succinate  50 mg Oral BID   pantoprazole  40 mg Oral Daily   sodium bicarbonate  650 mg Oral Daily   Infusions:   heparin 1,150 Units/hr (05/04/23 0959)   nitroGLYCERIN 55 mcg/min (05/03/23 2306)    Assessment: 77 yo female presented to [facility] with chest pain. Started on IV heparin on 10/24 and was therapeutic per discussion with pharmacist at [facility]. Considering cardiac cath on 10/28 if renal function stabilizes  Heparin level subtherapeutic at 0.15 on 900 units/hr. Per RN, patient can only be stuck in 1 arm. Heparin infusion paused briefly for level. No other issues or pauses with infusion noted or signs of bleeding.   10/26 PM update: heparin level 0.36, therapeutic on 1150 units/hr.   Goal of Therapy:  Heparin level 0.3-0.7 units/ml Monitor platelets by anticoagulation protocol:  Yes   Plan:  Continue heparin 1150 units/hr  Daily CBC and heparin level while on heparin  Monitor for s/sx of bleeding  F/U after [facility] Monday  Markeese Boyajian U Paytes 05/04/2023,7:36 PM

## 2023-05-05 DIAGNOSIS — I200 Unstable angina: Secondary | ICD-10-CM | POA: Diagnosis not present

## 2023-05-05 LAB — GLUCOSE, CAPILLARY
Glucose-Capillary: 129 mg/dL — ABNORMAL HIGH (ref 70–99)
Glucose-Capillary: 120 mg/dL — ABNORMAL HIGH (ref 70–99)
Glucose-Capillary: 153 mg/dL — ABNORMAL HIGH (ref 70–99)
Glucose-Capillary: 143 mg/dL — ABNORMAL HIGH (ref 70–99)

## 2023-05-05 LAB — CBC
HCT: 28.5 % — ABNORMAL LOW (ref 36.0–46.0)
Hemoglobin: 9 g/dL — ABNORMAL LOW (ref 12.0–15.0)
MCH: 26.7 pg (ref 26.0–34.0)
MCHC: 31.6 g/dL (ref 30.0–36.0)
MCV: 84.6 fL (ref 80.0–100.0)
Platelets: 167 10*3/uL (ref 150–400)
RBC: 3.37 MIL/uL — ABNORMAL LOW (ref 3.87–5.11)
RDW: 14.7 % (ref 11.5–15.5)
WBC: 8.7 10*3/uL (ref 4.0–10.5)
nRBC: 0 % (ref 0.0–0.2)

## 2023-05-05 LAB — BASIC METABOLIC PANEL
Anion gap: 6 (ref 5–15)
BUN: 40 mg/dL — ABNORMAL HIGH (ref 8–23)
CO2: 22 mmol/L (ref 22–32)
Calcium: 8.8 mg/dL — ABNORMAL LOW (ref 8.9–10.3)
Chloride: 108 mmol/L (ref 98–111)
Creatinine, Ser: 2.47 mg/dL — ABNORMAL HIGH (ref 0.44–1.00)
GFR, Estimated: 19 mL/min — ABNORMAL LOW (ref >60)
Glucose, Bld: 136 mg/dL — ABNORMAL HIGH (ref 70–99)
Potassium: 4.2 mmol/L (ref 3.5–5.1)
Sodium: 136 mmol/L (ref 135–145)

## 2023-05-05 LAB — LIPOPROTEIN A (LPA): Lipoprotein (a): <8.4 nmol/L (ref <75.0)

## 2023-05-05 LAB — HEPARIN LEVEL (UNFRACTIONATED): Heparin Unfractionated: 0.35 [IU]/mL (ref 0.30–0.70)

## 2023-05-05 MED ORDER — ASPIRIN 81 MG PO TBEC
81.0000 mg | DELAYED_RELEASE_TABLET | Freq: Every day | ORAL | Status: DC
  Administered 2023-05-07 – 2023-05-08 (×2): 81 mg via ORAL
  Filled 2023-05-05 (×2): qty 1

## 2023-05-05 MED ORDER — SODIUM CHLORIDE 0.9 % IV SOLN: INTRAVENOUS | Status: DC

## 2023-05-05 MED ORDER — ASPIRIN 81 MG PO CHEW
81.0000 mg | CHEWABLE_TABLET | ORAL | Status: AC
  Administered 2023-05-06: 81 mg via ORAL
  Filled 2023-05-05: qty 1

## 2023-05-05 NOTE — Progress Notes (Signed)
PHARMACY - ANTICOAGULATION CONSULT NOTE  Pharmacy Consult for heparin Indication: chest pain/ACS  No Known Allergies  Patient Measurements: Height: 5\' 5"  (165.1 cm) Weight: 86.1 kg (189 lb 14.4 oz) IBW/kg (Calculated) : 57 Heparin Dosing Weight: 76 kg  Vital Signs: Temp: 98.6 F (37 C) (10/27 0315) Temp Source: Oral (10/27 0315) BP: 144/57 (10/27 0315) Pulse Rate: 57 (10/27 0315)  Labs: Recent Labs    05/03/23 2224 05/04/23 0017 05/04/23 0732 05/04/23 1853 05/05/23 0452  HGB  --   --  10.0*  --  9.0*  HCT  --   --  32.1*  --  28.5*  PLT  --   --  215  --  167  HEPARINUNFRC 0.30  --  0.15* 0.36 0.35  CREATININE  --   --  2.65*  --  2.47*  TROPONINIHS 4,223* 3,862*  --   --   --     Estimated Creatinine Clearance: 20 mL/min (A) (by C-G formula based on SCr of 2.47 mg/dL (H)).   Medical History: Past Medical History:  Diagnosis Date   Chronic kidney disease    Diabetes (HCC)    Hypertension     Medications:  Scheduled:   aspirin EC  81 mg Oral Daily   cholecalciferol  5,000 Units Oral Daily   fluticasone furoate-vilanterol  1 puff Inhalation Daily   insulin aspart  0-15 Units Subcutaneous TID WC   metoprolol succinate  50 mg Oral BID   pantoprazole  40 mg Oral Daily   sodium bicarbonate  650 mg Oral Daily   Infusions:   heparin 1,150 Units/hr (05/04/23 0959)   nitroGLYCERIN 55 mcg/min (05/05/23 0411)    Assessment: 77 yo female presented to [facility] with chest pain. Started on IV heparin on 10/24 and was therapeutic per discussion with pharmacist at [facility]. Considering cardiac cath on 10/28 if renal function stabilizes  Heparin level therapeutic at 0.35 on 1150 units/hr. Hgb 9, plt: 167. No signs of bleeding noted.   Goal of Therapy:  Heparin level 0.3-0.7 units/ml Monitor platelets by anticoagulation protocol: Yes   Plan:  Continue heparin 1150 units/hr  Daily CBC and heparin level while on heparin  Monitor for s/sx of bleeding  F/U after [facility]  Monday  Thank you for involving pharmacy in the patient's care.   Theotis Burrow, PharmD PGY1 Acute Care Pharmacy Resident  05/05/2023 7:19 AM

## 2023-05-05 NOTE — Progress Notes (Signed)
Progress Note  Patient Name: Amber [name] Class Date of Encounter: 05/05/2023  Primary Cardiologist: None   Subjective   No chest pain or sob.   Inpatient Medications    Scheduled Meds:  aspirin EC  81 mg Oral Daily   cholecalciferol  5,000 Units Oral Daily   fluticasone furoate-vilanterol  1 puff Inhalation Daily   insulin aspart  0-15 Units Subcutaneous TID WC   metoprolol succinate  50 mg Oral BID   pantoprazole  40 mg Oral Daily   sodium bicarbonate  650 mg Oral Daily   Continuous Infusions:  sodium chloride     heparin 1,150 Units/hr (05/04/23 0959)   nitroGLYCERIN 55 mcg/min (05/05/23 0411)   PRN Meds: acetaminophen, nitroGLYCERIN, ondansetron (ZOFRAN) IV, mouth rinse   Vital Signs    Vitals:   05/04/23 2110 05/04/23 2354 05/05/23 0315 05/05/23 0750  BP: (!) 155/63 (!) 151/61 (!) 144/57 (!) 145/54  Pulse: 71 66 (!) 57 (!) 58  Resp:  16 16 (!) 21  Temp:  98.7 F (37.1 C) 98.6 F (37 C) 100 F (37.8 C)  TempSrc:  Oral Oral Oral  SpO2:  96% 97% 97%  Weight:      Height:        Intake/Output Summary (Last 24 hours) at 05/05/2023 0905 Last data filed at 05/04/2023 1500 Gross per 24 hour  Intake 422.57 ml  Output --  Net 422.57 ml   Filed Weights   05/03/23 1910  Weight: 86.1 kg    Telemetry    Nsr 66/min - Personally Reviewed  ECG    none - Personally Reviewed  Physical Exam   GEN: No acute distress.   Neck: No JVD Cardiac: RRR, no murmurs, rubs, or gallops.  Respiratory: Clear to auscultation bilaterally. GI: Soft, nontender, non-distended  MS: No edema; No deformity. Neuro:  Nonfocal  Psych: Normal affect   Labs    Chemistry Recent Labs  Lab 05/04/23 0732 05/05/23 0452  NA 137 136  K 4.6 4.2  CL 107 108  CO2 22 22  GLUCOSE 128* 136*  BUN 46* 40*  CREATININE 2.65* 2.47*  CALCIUM 8.8* 8.8*  PROT 5.6*  --   ALBUMIN 3.6  --   AST 33  --   ALT 15  --   ALKPHOS 70  --   BILITOT 0.8  --   GFRNONAA 18* 19*   ANIONGAP 8 6     Hematology Recent Labs  Lab 05/04/23 0732 05/05/23 0452  WBC 9.4 8.7  RBC 3.74* 3.37*  HGB 10.0* 9.0*  HCT 32.1* 28.5*  MCV 85.8 84.6  MCH 26.7 26.7  MCHC 31.2 31.6  RDW 14.7 14.7  PLT 215 167    Cardiac EnzymesNo results for input(s): "TROPONINI" in the last 168 hours. No results for input(s): "TROPIPOC" in the last 168 hours.   BNP Recent Labs  Lab 05/04/23 0732  BNP 996.7*     DDimer No results for input(s): "DDIMER" in the last 168 hours.   Radiology    No results found.  Cardiac Studies   See above  Patient Profile     77 y.o. female admitted with [place] and chronic renal insuff and positive stress test  Assessment & Plan    [place] - she is pain free on IV heparin and NTG. For left heart cath tomorrow. Chronic renal insuff, stage 3 with a solitary kidney. She will be hydrated starting later today.     For questions or updates, please contact  [facility] Please consult [email or web address] for contact info under Cardiology/STEMI.      Signed, Lewayne Bunting, MD  05/05/2023, 9:05 AM

## 2023-05-05 NOTE — Plan of Care (Signed)
  Problem: Education: Goal: Knowledge of General Education information will improve Description: Including pain rating scale, medication(s)/side effects and non-pharmacologic comfort measures Outcome: Progressing   Problem: Health Behavior/Discharge Planning: Goal: Ability to manage health-related needs will improve Outcome: Progressing   Problem: Clinical Measurements: Goal: Ability to maintain clinical measurements within normal limits will improve Outcome: Progressing Goal: Will remain free from infection Outcome: Progressing Goal: Diagnostic test results will improve Outcome: Progressing Goal: Respiratory complications will improve Outcome: Progressing Goal: Cardiovascular complication will be avoided Outcome: Progressing   Problem: Activity: Goal: Risk for activity intolerance will decrease Outcome: Progressing   Problem: Nutrition: Goal: Adequate nutrition will be maintained Outcome: Progressing   Problem: Coping: Goal: Level of anxiety will decrease Outcome: Progressing   Problem: Elimination: Goal: Will not experience complications related to bowel motility Outcome: Progressing Goal: Will not experience complications related to urinary retention Outcome: Progressing   Problem: Pain Management: Goal: General experience of comfort will improve Outcome: Progressing   Problem: Safety: Goal: Ability to remain free from injury will improve Outcome: Progressing   Problem: Skin Integrity: Goal: Risk for impaired skin integrity will decrease Outcome: Progressing   Problem: Education: Goal: Ability to describe self-care measures that may prevent or decrease complications (Diabetes Survival Skills Education) will improve Outcome: Progressing Goal: Individualized Educational Video(s) Outcome: Progressing   Problem: Coping: Goal: Ability to adjust to condition or change in health will improve Outcome: Progressing   Problem: Fluid Volume: Goal: Ability to  maintain a balanced intake and output will improve Outcome: Progressing   Problem: Health Behavior/Discharge Planning: Goal: Ability to identify and utilize available resources and services will improve Outcome: Progressing Goal: Ability to manage health-related needs will improve Outcome: Progressing   Problem: Metabolic: Goal: Ability to maintain appropriate glucose levels will improve Outcome: Progressing   Problem: Nutritional: Goal: Maintenance of adequate nutrition will improve Outcome: Progressing Goal: Progress toward achieving an optimal weight will improve Outcome: Progressing   Problem: Skin Integrity: Goal: Risk for impaired skin integrity will decrease Outcome: Progressing   Problem: Tissue Perfusion: Goal: Adequacy of tissue perfusion will improve Outcome: Progressing   Problem: Education: Goal: Understanding of cardiac disease, CV risk reduction, and recovery process will improve Outcome: Progressing Goal: Individualized Educational Video(s) Outcome: Progressing   Problem: Activity: Goal: Ability to tolerate increased activity will improve Outcome: Progressing   Problem: Cardiac: Goal: Ability to achieve and maintain adequate cardiovascular perfusion will improve Outcome: Progressing   Problem: Health Behavior/Discharge Planning: Goal: Ability to safely manage health-related needs after discharge will improve Outcome: Progressing   Problem: Education: Goal: Understanding of CV disease, CV risk reduction, and recovery process will improve Outcome: Progressing Goal: Individualized Educational Video(s) Outcome: Progressing   Problem: Activity: Goal: Ability to return to baseline activity level will improve Outcome: Progressing   Problem: Cardiovascular: Goal: Ability to achieve and maintain adequate cardiovascular perfusion will improve Outcome: Progressing Goal: Vascular access site(s) Level 0-1 will be maintained Outcome: Progressing    Problem: Health Behavior/Discharge Planning: Goal: Ability to safely manage health-related needs after discharge will improve Outcome: Progressing

## 2023-05-06 ENCOUNTER — Inpatient Hospital Stay (HOSPITAL_COMMUNITY): Payer: Medicare HMO

## 2023-05-06 ENCOUNTER — Encounter (HOSPITAL_COMMUNITY)
Admission: RE | Disposition: A | Discharge status: Home / Self Care | Payer: Self-pay | Source: Other Acute Inpatient Hospital | Attending: Internal Medicine

## 2023-05-06 LAB — CBC
HCT: 29.3 % — ABNORMAL LOW (ref 36.0–46.0)
Hemoglobin: 9.3 g/dL — ABNORMAL LOW (ref 12.0–15.0)
MCH: 27.4 pg (ref 26.0–34.0)
MCHC: 31.7 g/dL (ref 30.0–36.0)
MCV: 86.2 fL (ref 80.0–100.0)
Platelets: 198 10*3/uL (ref 150–400)
RBC: 3.4 MIL/uL — ABNORMAL LOW (ref 3.87–5.11)
RDW: 14.6 % (ref 11.5–15.5)
WBC: 9.2 10*3/uL (ref 4.0–10.5)
nRBC: 0 % (ref 0.0–0.2)

## 2023-05-06 LAB — BASIC METABOLIC PANEL
Anion gap: 7 (ref 5–15)
Anion gap: 12 (ref 5–15)
BUN: 31 mg/dL — ABNORMAL HIGH (ref 8–23)
BUN: 30 mg/dL — ABNORMAL HIGH (ref 8–23)
CO2: 19 mmol/L — ABNORMAL LOW (ref 22–32)
CO2: 19 mmol/L — ABNORMAL LOW (ref 22–32)
Calcium: 8.7 mg/dL — ABNORMAL LOW (ref 8.9–10.3)
Calcium: 8.6 mg/dL — ABNORMAL LOW (ref 8.9–10.3)
Chloride: 112 mmol/L — ABNORMAL HIGH (ref 98–111)
Chloride: 108 mmol/L (ref 98–111)
Creatinine, Ser: 1.71 mg/dL — ABNORMAL HIGH (ref 0.44–1.00)
Creatinine, Ser: 1.82 mg/dL — ABNORMAL HIGH (ref 0.44–1.00)
GFR, Estimated: 30 mL/min — ABNORMAL LOW (ref >60)
GFR, Estimated: 28 mL/min — ABNORMAL LOW (ref >60)
Glucose, Bld: 150 mg/dL — ABNORMAL HIGH (ref 70–99)
Glucose, Bld: 147 mg/dL — ABNORMAL HIGH (ref 70–99)
Potassium: 4.2 mmol/L (ref 3.5–5.1)
Potassium: 4.5 mmol/L (ref 3.5–5.1)
Sodium: 138 mmol/L (ref 135–145)
Sodium: 139 mmol/L (ref 135–145)

## 2023-05-06 LAB — GLUCOSE, CAPILLARY
Glucose-Capillary: 143 mg/dL — ABNORMAL HIGH (ref 70–99)
Glucose-Capillary: 227 mg/dL — ABNORMAL HIGH (ref 70–99)
Glucose-Capillary: 141 mg/dL — ABNORMAL HIGH (ref 70–99)
Glucose-Capillary: 114 mg/dL — ABNORMAL HIGH (ref 70–99)

## 2023-05-06 LAB — BRAIN NATRIURETIC PEPTIDE: B Natriuretic Peptide: 1419.4 pg/mL — ABNORMAL HIGH (ref 0.0–100.0)

## 2023-05-06 LAB — HEPARIN LEVEL (UNFRACTIONATED): Heparin Unfractionated: 0.27 [IU]/mL — ABNORMAL LOW (ref 0.30–0.70)

## 2023-05-06 SURGERY — INVASIVE LAB ABORTED CASE
Anesthesia: LOCAL

## 2023-05-06 MED ORDER — FUROSEMIDE 10 MG/ML IJ SOLN
INTRAMUSCULAR | Status: AC
  Filled 2023-05-06: qty 4

## 2023-05-06 MED ORDER — ONDANSETRON HCL 4 MG/2ML IJ SOLN
INTRAMUSCULAR | Status: AC
  Filled 2023-05-06: qty 2

## 2023-05-06 MED ORDER — MORPHINE SULFATE (PF) 2 MG/ML IV SOLN
1.0000 mg | Freq: Once | INTRAVENOUS | Status: AC
  Administered 2023-05-06: 1 mg via INTRAVENOUS
  Filled 2023-05-06: qty 1

## 2023-05-06 MED ORDER — MORPHINE SULFATE (PF) 2 MG/ML IV SOLN
INTRAVENOUS | Status: DC | PRN
  Administered 2023-05-06: 1 mg via INTRAVENOUS

## 2023-05-06 MED ORDER — FUROSEMIDE 10 MG/ML IJ SOLN
60.0000 mg | Freq: Once | INTRAMUSCULAR | Status: AC
  Administered 2023-05-06: 60 mg via INTRAVENOUS
  Filled 2023-05-06: qty 6

## 2023-05-06 MED ORDER — HYDRALAZINE HCL 20 MG/ML IJ SOLN
INTRAMUSCULAR | Status: AC
  Filled 2023-05-06: qty 1

## 2023-05-06 MED ORDER — ORAL CARE MOUTH RINSE
15.0000 mL | OROMUCOSAL | Status: DC
  Administered 2023-05-06 – 2023-05-10 (×17): 15 mL via OROMUCOSAL

## 2023-05-06 MED ORDER — HYDRALAZINE HCL 50 MG PO TABS
50.0000 mg | ORAL_TABLET | Freq: Three times a day (TID) | ORAL | Status: DC
  Administered 2023-05-06 – 2023-05-09 (×7): 50 mg via ORAL
  Filled 2023-05-06 (×7): qty 1

## 2023-05-06 MED ORDER — AMLODIPINE BESYLATE 5 MG PO TABS
5.0000 mg | ORAL_TABLET | Freq: Every day | ORAL | Status: DC
  Administered 2023-05-06: 5 mg via ORAL
  Filled 2023-05-06: qty 1

## 2023-05-06 MED ORDER — FUROSEMIDE 10 MG/ML IJ SOLN
INTRAMUSCULAR | Status: DC | PRN
  Administered 2023-05-06 (×2): 40 mg via INTRAVENOUS

## 2023-05-06 MED ORDER — CHLORHEXIDINE GLUCONATE CLOTH 2 % EX PADS
6.0000 | MEDICATED_PAD | Freq: Every day | CUTANEOUS | Status: DC
Start: 2023-05-06 — End: 2023-05-12
  Administered 2023-05-06 – 2023-05-12 (×6): 6 via TOPICAL

## 2023-05-06 MED ORDER — HYDRALAZINE HCL 20 MG/ML IJ SOLN
INTRAMUSCULAR | Status: DC | PRN
  Administered 2023-05-06: 10 mg via INTRAVENOUS

## 2023-05-06 MED ORDER — ALBUTEROL SULFATE (2.5 MG/3ML) 0.083% IN NEBU: 2.5000 mg | INHALATION_SOLUTION | Freq: Four times a day (QID) | RESPIRATORY_TRACT | Status: DC | PRN

## 2023-05-06 MED ORDER — ONDANSETRON HCL 4 MG/2ML IJ SOLN
INTRAMUSCULAR | Status: DC | PRN
  Administered 2023-05-06: 4 mg via INTRAVENOUS

## 2023-05-06 MED ORDER — NITROGLYCERIN IN D5W 200-5 MCG/ML-% IV SOLN
INTRAVENOUS | Status: AC
  Filled 2023-05-06: qty 250

## 2023-05-06 MED ORDER — ALBUTEROL SULFATE (2.5 MG/3ML) 0.083% IN NEBU
2.5000 mg | INHALATION_SOLUTION | Freq: Once | RESPIRATORY_TRACT | Status: AC
  Administered 2023-05-06: 2.5 mg via RESPIRATORY_TRACT
  Filled 2023-05-06: qty 3

## 2023-05-06 MED ORDER — ORAL CARE MOUTH RINSE: 15.0000 mL | OROMUCOSAL | Status: DC | PRN

## 2023-05-06 MED ORDER — MORPHINE SULFATE (PF) 2 MG/ML IV SOLN
INTRAVENOUS | Status: AC
  Filled 2023-05-06: qty 1

## 2023-05-06 SURGICAL SUPPLY — 9 items
CATH BALLN WEDGE 5F 110CM (CATHETERS) IMPLANT
ELECT DEFIB PAD ADLT CADENCE (PAD) IMPLANT
GLIDESHEATH SLEND SS 6F .021 (SHEATH) IMPLANT
GUIDEWIRE INQWIRE 1.5J.035X260 (WIRE) IMPLANT
INQWIRE 1.5J .035X260CM (WIRE)
PACK CARDIAC CATHETERIZATION (CUSTOM PROCEDURE TRAY) ×1 IMPLANT
SET ATX-X65L (MISCELLANEOUS) IMPLANT
SHEATH GLIDE SLENDER 4/5FR (SHEATH) IMPLANT
SHEATH PROBE COVER 6X72 (BAG) IMPLANT

## 2023-05-06 NOTE — Progress Notes (Addendum)
Rounding Note    Patient Name: Amber [name] Date of Encounter: 05/06/2023  [facility] Cardiologist: Marlyn Corporal Madireddy, MD   Subjective   No acute overnight events. She is noticeably tachypneic upon walking in the room and states she has had worsening breathing overnight. She said she has had to sit up on the side of the bed since around midnight. She also reports chest pain with essentially any activity. Currently chest pain free.  Inpatient Medications    Scheduled Meds:  [START ON 05/07/2023] aspirin EC  81 mg Oral Daily   cholecalciferol  5,000 Units Oral Daily   fluticasone furoate-vilanterol  1 puff Inhalation Daily   insulin aspart  0-15 Units Subcutaneous TID WC   metoprolol succinate  50 mg Oral BID   pantoprazole  40 mg Oral Daily   sodium bicarbonate  650 mg Oral Daily   Continuous Infusions:  sodium chloride 50 mL/hr at 05/06/23 0728   heparin 1,150 Units/hr (05/06/23 0700)   nitroGLYCERIN 55 mcg/min (05/06/23 0700)   PRN Meds: acetaminophen, nitroGLYCERIN, ondansetron (ZOFRAN) IV, mouth rinse   Vital Signs    Vitals:   05/06/23 0408 05/06/23 0416 05/06/23 0444 05/06/23 0600  BP: (!) 176/77 (!) 177/61 (!) 163/58   Pulse: 62 (!) 59 60 (!) 51  Resp: 20 20 20 20   Temp: 98.6 F (37 C)     TempSrc: Oral     SpO2: 94% 94% 94% 94%  Weight:      Height:        Intake/Output Summary (Last 24 hours) at 05/06/2023 0729 Last data filed at 05/06/2023 0700 Gross per 24 hour  Intake 1683.82 ml  Output --  Net 1683.82 ml      05/05/2023    3:00 PM 05/03/2023    7:10 PM 05/01/2023    1:00 PM  Last 3 Weights  Weight (lbs) 193 lb 12.8 oz 189 lb 14.4 oz 188 lb  Weight (kg) 87.907 kg 86.138 kg 85.276 kg      Telemetry    Sinus rhythm with rates in the 50s to 60s. - Personally Reviewed  ECG    No new ECG tracing today. - Personally Reviewed  Physical Exam   GEN: 77 year old obese Caucasian female in mild respiratory  distress. Neck: Distended neck veins. Cardiac: RRR. No murmurs, rubs, or gallops.  Respiratory: Tachypneic with mild increase work of breathing. Course breath sounds and expiratory wheezes noted throughout. No significant crackles.   GI: Soft, non-distended, and non-tender. MS: 1+ pitting edema of bilateral lower extremities. No deformity. Skin: Warm and dry. Neuro:  No focal deficits. Psych: Normal affect. Responds appropriately.  Labs    High Sensitivity Troponin:   Recent Labs  Lab 05/03/23 2224 05/04/23 0017  TROPONINIHS 4,223* 3,862*     Chemistry Recent Labs  Lab 05/04/23 0732 05/05/23 0452  NA 137 136  K 4.6 4.2  CL 107 108  CO2 22 22  GLUCOSE 128* 136*  BUN 46* 40*  CREATININE 2.65* 2.47*  CALCIUM 8.8* 8.8*  PROT 5.6*  --   ALBUMIN 3.6  --   AST 33  --   ALT 15  --   ALKPHOS 70  --   BILITOT 0.8  --   GFRNONAA 18* 19*  ANIONGAP 8 6    Lipids No results for input(s): "CHOL", "TRIG", "HDL", "LABVLDL", "LDLCALC", "CHOLHDL" in the last 168 hours.  Hematology Recent Labs  Lab 05/04/23 0732 05/05/23 0452  WBC 9.4  8.7  RBC 3.74* 3.37*  HGB 10.0* 9.0*  HCT 32.1* 28.5*  MCV 85.8 84.6  MCH 26.7 26.7  MCHC 31.2 31.6  RDW 14.7 14.7  PLT 215 167   Thyroid No results for input(s): "TSH", "FREET4" in the last 168 hours.  BNP Recent Labs  Lab 05/04/23 0732  BNP 996.7*    DDimer No results for input(s): "DDIMER" in the last 168 hours.   Radiology    No results found.  Cardiac Studies   Left Cardiac Catheterization 08/16/2017 ([facility]):   Ost LAD to Prox LAD lesion is 50% stenosed.   Ost Cx to Prox Cx lesion is 30% stenosed.   Mid RCA lesion is 40% stenosed.   _______________  Echocardiogram 05/02/2023 [facility]): - LV: LVEF 45-50% with hypokinesis of apical/ septal and apical inferior segments as well as apex. Diastolic function has an impaired relaxation pattern. - RV: Normal size and function. - LA: Mildly dilated. -  RA: Normal size. - Mital Valve: Mild regurgitation. No stenosis.  - Aortic Valve: Mild sclerosis but no stenosis. No regurgitation. - Pulmonic Valve: No stenosis or regurgitation. - Tricuspid Valve: Trivial regurgitation. RVSP mildly elevated at 39 mmHg. - Pericardium: No effusion. - Aorta: Aortic root and ascending/ descending aorta were poorly visualized.   Patient Profile     77 y.o. female history of mild non-obstructive CAD on ca cardiac catheterization in 08/2017, hypertension, hyperlipidemia, type 2 diabetes mellitus, CKD stage III with solitary kidney, GERD, hiatal hernia, chronic anemia, right breast cancer s/p mastectomy in 2020, and obesity who presented to [facility] on 05/01/2023 from the Cardiology office for further evaluation of chest pain and dyspnea on exertion. Troponin I remained negative at [facility] and she underwent a Myoview which showed reversible ischemia in LAD distribution. She was transferred to [facility] for further evaluation with cardiac catheterization. High-sensitivity upon arrival to [facility] was elevated and she had now ruled in for a NSTEMI.   Assessment & Plan    NSTEMI CAD Patient ha on-obstructive CAD noted on cardiac catheterization in 08/2017 with 50% stenosis of ostial to proximal LAD, 30% stenosis of ostial to proximal LCX, and 40% stenosis of mid RCA noted at that time. She presented to [facility] from the Cardiology office for further evaluation of chest pain and shortness of breath with exertion for the last 2-3 months as least. EKG reportedly showed sinus bradycardia with no acute ischemic changes. Troponin I negative x5. Echo showed LVEF of 45-50% with hypokinesis of apical/ septal and apical inferior segments as well as apex. Myoview showed findings concerning for reversible ischemia in LAD distribution. Therefore, she was transferred to [facility] for cardiac catheterization when renal function allows. High-sensitivity  troponin elevated here peaking at 4,223 ruling in for NSTEMI. - She report chest main with essentially any activity but is currently chest pain free at rest. She is also noticeably tachypneic today and reports worsening shortness of breath overnight. She has course breaths sounds and expiratory wheezing on exam. - Continue IV Heparin - Continue IV Nitroglycerin. - Continue Aspirin 81mg  daily, Toprol-XL 50mg  twice daily, and Lipitor 40mg  daily. - She is currently on the board for LHC today. However, she is currently not able to lay flat due to shortness of breath. Will repeat BNP and give breathing treatment. Hesitant to give diuresis given her renal function. If breathing stabilizes after breathing treatment and she is able to lay flat, will proceed with cath today. Will plan  to do a R&LHC.   The patient understands that risks include but are not limited to stroke (1 in 1000), death (1 in 1000), kidney failure [usually temporary] (1 in 500), bleeding (1 in 200), allergic reaction [possibly serious] (1 in 200), and agrees to proceed.    Chronic HFmrEF BNP was elevated at 653 at [facility]. Chest x-ray showed no acute findings. Echo showed  LVEF of 45-50% with hypokinesis of apical/ septal and apical inferior segments as well as apex and impaired relaxation pattern, normal RV size and function, mild mitral regurgitation, and mildly elevated PASP of 39 mmHg.  She was given one dose of IV Lasix at [facility] but then was treated with fluids given worsening renal function. No documented urinary output. Weight up 4lbs since arrival to [facility].  - She is noticeably tachypenic and short of breath after being starting on IV fluids yesterday. She reports having to sit up on the side of the bed since midnight. She has course breath sounds and expiratory wheezing noted on exam. Also looks mildly volume overloaded.  - Will give breathing treatment and recheck BNP.  - Will hold off on additional Lasix for now given CKD and  need for cath but may need this if BNP markedly elevated or breathing does not improve with breathing treatment.  - Continue home Toprol-XL 50mg  twice daily.  - Home Losartan on hold due to renal function. - Home Hydralazine was initially held given need for Nitroglycerin drip but BP is still elevated so this can be restarted. Will restart Hydralazine 50mg  three times daily. - Monitor daily weight, strict I/Os, and renal function.  Hypertensive Urgency Systolic BP >200 on arrival. BP improved but still elevated. - Continue IV Nitroglycerin drip. - Continue Toprol-XL 50mg  twice daily. - Will restart home Hydralazine 50mg  three time daily and Amlodipine 5mg  daily. - Continue to hold Losartan given renal function.   Hyperlipidemia Lipid panel at [facility]: Total Cholesterol 121, Triglycerides 152, HDL 33, LDL 57.  - Will stop home Pravastatin and start Lipitor 40mg  daily.  Type 2 Diabetes Mellitus Hemoglobin A1c at [facility] 5.0%. Of note, she did have a severe hypoglycemic episode one night at [facility] with glucose dropping to 37 requiring D5 half normal saline. - On Glimepiride and Pioglitozone at home. These are currently on hold. - Continue sliding scale insulin.   AKI on CKD Stage IIIb Baseline creatinine is around 2.1.  - Creatinine 2.47 yesterday. Today's labs pending. - Continue to hold Losartan. - Followed by Nephrology as an outpatient.    Hiatal Hernia GERD  Patient has a history of GERD and hiatal hernia that is being treated medically. She states last EGD was about 1 year ago.  - Some of patient's chest pain symptoms have sounded GI in nature.  - Continue Protonix 40mg  daily.   COPD She is tachypneic with course breaths sounds and wheezing noted on exam. - Continue home inhalers.  - Will give an albuterol nebulizer treatment this morning.   Chronic Normocytic Anemia Baseline hemoglobin around 11. Hemoglobin has been dropping the last couple of days 11.1 >> 10.0 >> 9.0.   - Stable at 9.3 today.   For questions or updates, please contact  [facility] Please consult [email or web address] for contact info under        Signed, Corrin Parker, PA-C  05/06/2023, 7:29 AM    Patient seen and examined. Agree with assessment and plan.  When seen earlier this morning by Shawnee Knapp had expiratory wheezing and  coarse breath sounds.  She has since received a breathing treatment with albuterol and admits that her breathing is much better.  On exam now, rare faint intermittent expiratory wheeze but predominantly, the wheezing has resolved.  She is breathing better.  Troponins have increased to 4223 consistent with non-STEMI.  Presently she is without chest pain.  He has CKD, with creatinine reportedly elevated at 2.1 which is near her baseline which had increased to 2.47.  Creatinine today post hydration had improved to 1.7.  Plan is for right and left heart cardiac catheterization.  Echo Doppler study at [facility] had shown EF 45 to 50% with hypokinesis of the apical/septal and apical inferior segments.  Myoview showed findings concerning for reversible ischemia in LAD distribution.  Plan for right and left heart cardiac catheterization this morning with documentation of LVEDP but without ventriculography.  Will need brachial vein IV placed in Cath Lab.  Patient is aware of the risk benefits of the procedure as outlined above.   Lennette Bihari, MD, [facility] 05/06/2023 10:39 AM

## 2023-05-06 NOTE — Progress Notes (Addendum)
PHARMACY - ANTICOAGULATION CONSULT NOTE  Pharmacy Consult for heparin Indication: chest pain/ACS  Allergies  Allergen Reactions   Advil [Ibuprofen] Other (See Comments)    Intolerance due to kidney problem   Aleve [Naproxen] Other (See Comments)    Intolerance due to kidney problem    Patient Measurements: Height: 5\' 5"  (165.1 cm) Weight: 87.9 kg (193 lb 12.8 oz) IBW/kg (Calculated) : 57 Heparin Dosing Weight: 76 kg  Vital Signs: Temp: 98.6 F (37 C) (10/28 0408) Temp Source: Oral (10/28 0408) BP: 163/58 (10/28 0444) Pulse Rate: 51 (10/28 0600)  Labs: Recent Labs    05/03/23 2224 05/03/23 2224 05/04/23 0017 05/04/23 0732 05/04/23 1853 05/05/23 0452 05/06/23 0639  HGB  --    < >  --  10.0*  --  9.0* 9.3*  HCT  --   --   --  32.1*  --  28.5* 29.3*  PLT  --   --   --  215  --  167 198  HEPARINUNFRC 0.30  --   --  0.15* 0.36 0.35 0.27*  CREATININE  --   --   --  2.65*  --  2.47* 1.71*  TROPONINIHS 4,223*  --  3,862*  --   --   --   --    < > = values in this interval not displayed.    Estimated Creatinine Clearance: 29.2 mL/min (A) (by C-G formula based on SCr of 1.71 mg/dL (H)).   Medical History: Past Medical History:  Diagnosis Date   Chronic kidney disease    Diabetes (HCC)    Hypertension     Medications:  Scheduled:   albuterol  2.5 mg Nebulization Once   [START ON 05/07/2023] aspirin EC  81 mg Oral Daily   cholecalciferol  5,000 Units Oral Daily   fluticasone furoate-vilanterol  1 puff Inhalation Daily   insulin aspart  0-15 Units Subcutaneous TID WC   metoprolol succinate  50 mg Oral BID   pantoprazole  40 mg Oral Daily   sodium bicarbonate  650 mg Oral Daily   Infusions:   sodium chloride 50 mL/hr at 05/06/23 0728   heparin 1,150 Units/hr (05/06/23 0700)   nitroGLYCERIN 55 mcg/min (05/06/23 0700)    Assessment: 77 yo Amber [name] presented to [place] with chest pain. Started on IV heparin on 10/24 and was therapeutic per discussion with  pharmacist at [facility]. Considering cardiac cath on 10/28 if renal function stabilizes  Heparin level slightly subtherapeutic at 0.27, CBC stable.  Goal of Therapy:  Heparin level 0.3-0.7 units/ml Monitor platelets by anticoagulation protocol: Yes   Plan:  Increase heparin 1250 units/hr  Cath today  ADDENDUM 1415: cath cancelled with inability to lie flat, will resume heparin with no bolus at prior rate and recheck heparin level with am labs.  Fredonia Highland, PharmD, BCPS, [facility] Clinical Pharmacist Please check AMION for all [facility] Pharmacy numbers 05/06/2023

## 2023-05-06 NOTE — Plan of Care (Signed)
°  Problem: Education: °Goal: Knowledge of General Education information will improve °Description: Including pain rating scale, medication(s)/side effects and non-pharmacologic comfort measures °Outcome: Progressing °  °Problem: Health Behavior/Discharge Planning: °Goal: Ability to manage health-related needs will improve °Outcome: Progressing °  °Problem: Clinical Measurements: °Goal: Ability to maintain clinical measurements within normal limits will improve °Outcome: Progressing °Goal: Will remain free from infection °Outcome: Progressing °Goal: Respiratory complications will improve °Outcome: Progressing °Goal: Cardiovascular complication will be avoided °Outcome: Progressing °  °Problem: Activity: °Goal: Risk for activity intolerance will decrease °Outcome: Progressing °  °Problem: Coping: °Goal: Level of anxiety will decrease °Outcome: Progressing °  °

## 2023-05-06 NOTE — Progress Notes (Signed)
   Patient's R/LHC was cancelled due to acute respiratory failure and inability to lay flat. She was started on BiPAP and given a dose of IV Lasix 80mg  daily. Went by to check on patient this afternoon. She looks like she is breathing more comfortably on BiPAP but she still has diminished and course breath sounds. She has only had 150 cc of urinary output. RN performed bladder scan which showed 480cc. She was able to void about 100 cc but had residual 320 cc after this. Discussed with Dr. Tresa Endo. Will order in and out catheter and given another dose of IV Lasix 60mg . Will repeat BMET later this evening.  Corrin Parker, PA-C 05/06/2023 4:46 PM

## 2023-05-07 ENCOUNTER — Inpatient Hospital Stay (HOSPITAL_COMMUNITY): Payer: Medicare HMO

## 2023-05-07 DIAGNOSIS — I5021 Acute systolic (congestive) heart failure: Secondary | ICD-10-CM | POA: Diagnosis not present

## 2023-05-07 DIAGNOSIS — J9601 Acute respiratory failure with hypoxia: Secondary | ICD-10-CM

## 2023-05-07 DIAGNOSIS — J449 Chronic obstructive pulmonary disease, unspecified: Secondary | ICD-10-CM | POA: Diagnosis not present

## 2023-05-07 DIAGNOSIS — N184 Chronic kidney disease, stage 4 (severe): Secondary | ICD-10-CM

## 2023-05-07 DIAGNOSIS — E119 Type 2 diabetes mellitus without complications: Secondary | ICD-10-CM | POA: Diagnosis not present

## 2023-05-07 DIAGNOSIS — I214 Non-ST elevation (NSTEMI) myocardial infarction: Secondary | ICD-10-CM

## 2023-05-07 LAB — CBC
HCT: 29.1 % — ABNORMAL LOW (ref 36.0–46.0)
Hemoglobin: 9.2 g/dL — ABNORMAL LOW (ref 12.0–15.0)
MCH: 27.2 pg (ref 26.0–34.0)
MCHC: 31.6 g/dL (ref 30.0–36.0)
MCV: 86.1 fL (ref 80.0–100.0)
Platelets: 187 10*3/uL (ref 150–400)
RBC: 3.38 MIL/uL — ABNORMAL LOW (ref 3.87–5.11)
RDW: 14.7 % (ref 11.5–15.5)
WBC: 10 10*3/uL (ref 4.0–10.5)
nRBC: 0 % (ref 0.0–0.2)

## 2023-05-07 LAB — GLUCOSE, CAPILLARY
Glucose-Capillary: 108 mg/dL — ABNORMAL HIGH (ref 70–99)
Glucose-Capillary: 113 mg/dL — ABNORMAL HIGH (ref 70–99)
Glucose-Capillary: 158 mg/dL — ABNORMAL HIGH (ref 70–99)
Glucose-Capillary: 196 mg/dL — ABNORMAL HIGH (ref 70–99)
Glucose-Capillary: 155 mg/dL — ABNORMAL HIGH (ref 70–99)

## 2023-05-07 LAB — BASIC METABOLIC PANEL
Anion gap: 10 (ref 5–15)
BUN: 29 mg/dL — ABNORMAL HIGH (ref 8–23)
CO2: 22 mmol/L (ref 22–32)
Calcium: 8.8 mg/dL — ABNORMAL LOW (ref 8.9–10.3)
Chloride: 108 mmol/L (ref 98–111)
Creatinine, Ser: 1.98 mg/dL — ABNORMAL HIGH (ref 0.44–1.00)
GFR, Estimated: 25 mL/min — ABNORMAL LOW (ref >60)
Glucose, Bld: 109 mg/dL — ABNORMAL HIGH (ref 70–99)
Potassium: 4 mmol/L (ref 3.5–5.1)
Sodium: 140 mmol/L (ref 135–145)

## 2023-05-07 LAB — HEPARIN LEVEL (UNFRACTIONATED): Heparin Unfractionated: 0.26 [IU]/mL — ABNORMAL LOW (ref 0.30–0.70)

## 2023-05-07 MED ORDER — IPRATROPIUM-ALBUTEROL 0.5-2.5 (3) MG/3ML IN SOLN: 3.0000 mL | RESPIRATORY_TRACT | Status: DC

## 2023-05-07 MED ORDER — IPRATROPIUM-ALBUTEROL 0.5-2.5 (3) MG/3ML IN SOLN: 3.0000 mL | RESPIRATORY_TRACT | Status: DC | PRN

## 2023-05-07 MED ORDER — ATORVASTATIN CALCIUM 40 MG PO TABS
40.0000 mg | ORAL_TABLET | Freq: Every day | ORAL | Status: DC
  Administered 2023-05-07 – 2023-05-12 (×6): 40 mg via ORAL
  Filled 2023-05-07 (×6): qty 1

## 2023-05-07 MED ORDER — AMLODIPINE BESYLATE 10 MG PO TABS
10.0000 mg | ORAL_TABLET | Freq: Every day | ORAL | Status: DC
  Administered 2023-05-07 – 2023-05-12 (×6): 10 mg via ORAL
  Filled 2023-05-07 (×7): qty 1

## 2023-05-07 MED ORDER — FUROSEMIDE 10 MG/ML IJ SOLN
40.0000 mg | Freq: Two times a day (BID) | INTRAMUSCULAR | Status: AC
  Administered 2023-05-07 (×2): 40 mg via INTRAVENOUS
  Filled 2023-05-07 (×2): qty 4

## 2023-05-07 NOTE — Progress Notes (Signed)
Patient taken off BiPAP and placed on 4L nasal cannula. Tolerating well at this time. RN aware.

## 2023-05-07 NOTE — Consult Note (Signed)
NAME:  Amber [name], MRN:  [id number], DOB:  1946-06-18, LOS: 4 ADMISSION DATE:  05/03/2023, CONSULTATION DATE:  10/29 REFERRING MD:  Dr. Jenene Slicker, CHIEF COMPLAINT:  chest pain   History of Present Illness:  Patient is a 77 yo F w/ pertinent PMH CAD, HTN, HLD, T2DM, CKD stage 3b, COPD presents to [facility] on 10/25 w/ chest pain.  Patient admitted to [facility] on 10/23 from cardiology office with chest pain and dyspnea on exertion. Troponin 1 negative x5. Echo showing lvef 45-505 w/ hypokinesis of apical/septal and apical inferior segments as well as apex. Myoview showed reversible ischemia in LAD and was transferred to [facility] on 10/25 for cardiology evaluation w/ heart cath. Trop now elevated at 4,223 then 3,862. Started on heparin, asa for NSTEMI. On nitroglycerin drip for HTN urgency.   On 10/28 heart cath canceled due to respiratory failure and inability to lay flat. Patient started on bipap and given 80 IV lasix w/ 850 cc UOP. CXR w/ pulmonary edema. 10/29 pccm consulted.   Pertinent  Medical History   Past Medical History:  Diagnosis Date   Chronic kidney disease    Diabetes (HCC)    Hypertension      Significant Hospital Events: Including procedures, antibiotic start and stop dates in addition to other pertinent events   10/25 admitted w/ NSTEMI 10/28 failed to get heart cath due to dyspnea 10/29 pccm consulted  Interim History / Subjective:  On room air sats 91% States breathing improved but only bad when she lays flat. States that this is chronic and has to sleep with head elevated  Objective   Blood pressure 130/73, pulse 60, temperature 98.1 F (36.7 C), temperature source Axillary, resp. rate (!) 22, height 5\' 5"  (1.651 m), weight 97.8 kg, SpO2 100%.    Vent Mode: BIPAP;PCV FiO2 (%):  [40 %-100 %] 40 % Set Rate:  [12 bmp] 12 bmp PEEP:  [6 cmH20] 6 cmH20   Intake/Output Summary (Last 24 hours) at 05/07/2023 1057 Last data filed at 05/07/2023  1000 Gross per 24 hour  Intake 729.5 ml  Output 1150 ml  Net -420.5 ml   Filed Weights   05/03/23 1910 05/05/23 1500 05/07/23 0540  Weight: 86.1 kg 87.9 kg 97.8 kg    Examination: General:  NAD HEENT: MM pink/moist Neuro: Aox3; MAE CV: s1s2, RRR, no m/r/g PULM:  dim clear BS bilaterally; room air GI: soft, bsx4 active  Extremities: warm/dry, no edema  Skin: no rashes or lesions    Resolved Hospital Problem list     Assessment & Plan:  Acute respiratory failure w/ hypoxia Pulmonary edema from CHF Hx of COPD: on breo ellipta; not seen by pulmonary outpt Previous hx of tobacco abuse: smoked for 40 years pack a day; quit 10 years ago Plan: -currently on room air; [place] for sat goal >92% -given 40 lasix BID; 400 cc UOP currently this am; monitor UOP -bipap at bedtime and prn -prn duoneb; consider giving prn treatment shortly before going to cath lab -cont home breo ellipta -pulm toiletry: IS, PT, OOB  NSTEMI CAD HFmrEF HTN urgency HLD Plan: -cards following -cont heparin and asa -cont nitroglycerin drip -cont metoprolol, hydralazine, norvasc -cont statin  AKI on CKD 3b -Creat today 1.98 from 1.82 yesterday (baseline aroudn 1.8-2.2) Plan: -diuretics as above -monitor UOP -Trend BMP -Replace electrolytes as indicated -Avoid nephrotoxic agents, ensure adequate renal perfusion  T2DM Plan: -ssi and cbg monitoring  Chronic anemia Plan: -trend cbc  GERD Hiatal hernia  Plan: -ppi   Best Practice (right click and "Reselect all SmartList Selections" daily)   Diet/type: Regular consistency (see orders) DVT prophylaxis: systemic heparin GI prophylaxis: PPI Lines: N/A Foley:  N/A Code Status:  full code Last date of multidisciplinary goals of care discussion [per primary]  Labs   CBC: Recent Labs  Lab 05/04/23 0732 05/05/23 0452 05/06/23 0639 05/07/23 0317  WBC 9.4 8.7 9.2 10.0  HGB 10.0* 9.0* 9.3* 9.2*  HCT 32.1* 28.5* 29.3* 29.1*  MCV 85.8  84.6 86.2 86.1  PLT 215 167 198 187    Basic Metabolic Panel: Recent Labs  Lab 05/04/23 0732 05/05/23 0452 05/06/23 0639 05/06/23 1805 05/07/23 0317  NA 137 136 138 139 140  K 4.6 4.2 4.2 4.5 4.0  CL 107 108 112* 108 108  CO2 22 22 19* 19* 22  GLUCOSE 128* 136* 150* 147* 109*  BUN 46* 40* 31* 30* 29*  CREATININE 2.65* 2.47* 1.71* 1.82* 1.98*  CALCIUM 8.8* 8.8* 8.7* 8.6* 8.8*   GFR: Estimated Creatinine Clearance: 26.7 mL/min (A) (by C-G formula based on SCr of 1.98 mg/dL (H)). Recent Labs  Lab 05/04/23 0732 05/05/23 0452 05/06/23 0639 05/07/23 0317  WBC 9.4 8.7 9.2 10.0    Liver Function Tests: Recent Labs  Lab 05/04/23 0732  AST 33  ALT 15  ALKPHOS 70  BILITOT 0.8  PROT 5.6*  ALBUMIN 3.6   No results for input(s): "LIPASE", "AMYLASE" in the last 168 hours. No results for input(s): "AMMONIA" in the last 168 hours.  ABG No results found for: "PHART", "PCO2ART", "PO2ART", "HCO3", "TCO2", "ACIDBASEDEF", "O2SAT"   Coagulation Profile: No results for input(s): "INR", "PROTIME" in the last 168 hours.  Cardiac Enzymes: No results for input(s): "CKTOTAL", "CKMB", "CKMBINDEX", "TROPONINI" in the last 168 hours.  HbA1C: Hgb A1c MFr Bld  Date/Time Value Ref Range Status  05/03/2023 10:24 PM 5.4 4.8 - 5.6 % Final    Comment:    (NOTE) Pre diabetes:          5.7%-6.4%  Diabetes:              >6.4%  Glycemic control for   <7.0% adults with diabetes     CBG: Recent Labs  Lab 05/06/23 1155 05/06/23 1642 05/06/23 2122 05/07/23 0350 05/07/23 0655  GLUCAP 227* 141* 114* 108* 113*    Review of Systems:   Review of Systems  Constitutional:  Negative for fever.  Respiratory:  Positive for shortness of breath. Negative for cough and sputum production.   Cardiovascular:  Positive for chest pain. Negative for leg swelling.  Gastrointestinal:  Negative for abdominal pain, nausea and vomiting.     Past Medical History:  She,  has a past medical history  of Chronic kidney disease, Diabetes (HCC), and Hypertension.   Surgical History:   Past Surgical History:  Procedure Laterality Date   BACK SURGERY     x 2   BREAST BIOPSY Right    x3     Social History:   reports that she quit smoking about 10 years ago. Her smoking use included cigarettes. She started smoking about 60 years ago. She has a 50 pack-year smoking history. She has never used smokeless tobacco. She reports that she does not currently use alcohol. She reports that she does not currently use drugs.   Family History:  Her family history includes Diabetes in her sister; Healthy in her son and son; Heart disease in her father and sister; Kidney disease in her sister;  Throat cancer in her mother.   Allergies Allergies  Allergen Reactions   Advil [Ibuprofen] Other (See Comments)    Intolerance due to kidney problem   Aleve [Naproxen] Other (See Comments)    Intolerance due to kidney problem     Home Medications  Prior to Admission medications   Medication Sig Start Date End Date Taking? Authorizing Provider  amLODipine (NORVASC) 5 MG tablet Take 5 mg by mouth daily. 01/19/21  Yes [provider]  aspirin 81 MG EC tablet Take 81 mg by mouth daily.   Yes [provider]  Cholecalciferol 125 MCG (5000 UT) capsule Take 5,000 Units by mouth daily.   Yes [provider]  doxazosin (CARDURA) 2 MG tablet Take 2 mg by mouth daily. 11/01/20  Yes [provider]  fluticasone furoate-vilanterol (BREO ELLIPTA) 100-25 MCG/INH AEPB Inhale 1 puff into the lungs daily.   Yes [provider]  furosemide (LASIX) 40 MG tablet Take 40 mg by mouth daily. 10/28/20  Yes [provider]  glimepiride (AMARYL) 2 MG tablet Take 2 mg by mouth daily with breakfast. 11/01/20  Yes [provider]  hydrALAZINE (APRESOLINE) 50 MG tablet Take 50 mg by mouth 3 (three) times daily. 01/02/23  Yes [provider]  losartan (COZAAR) 100 MG tablet  Take 100 mg by mouth daily. 02/18/23  Yes [provider]  metoprolol succinate (TOPROL-XL) 50 MG 24 hr tablet Take 50 mg by mouth 2 (two) times daily. 11/01/20  Yes [provider]  nitroGLYCERIN (NITROSTAT) 0.4 MG SL tablet Place 0.4 mg under the tongue every 5 (five) minutes as needed for chest pain. 04/30/23  Yes [provider]  omeprazole (PRILOSEC) 40 MG capsule Take 40 mg by mouth 2 (two) times daily. 11/01/20  Yes [provider]  pioglitazone (ACTOS) 15 MG tablet Take 15 mg by mouth daily. 11/19/20  Yes [provider]  potassium chloride (KLOR-CON) 10 MEQ tablet Take 10 mEq by mouth 2 (two) times daily. 08/24/21  Yes [provider]  pravastatin (PRAVACHOL) 40 MG tablet Take 80 mg by mouth daily. 10/28/20  Yes [provider]  sodium bicarbonate 650 MG tablet Take 650 mg by mouth daily as needed for heartburn. 04/24/23  Yes [provider]     Critical care time: 45 minutes     JD Daryel November Pulmonary [facility] 05/07/2023, 10:57 AM  Please see [email or web address] for pager details.  From 7A-7P if no response, please call [phone]. After hours, please call ELink [phone].

## 2023-05-07 NOTE — Evaluation (Signed)
Physical Therapy Evaluation Patient Details Name: Amber [name] MRN: [id number] DOB: 1945/07/17 Today's Date: 05/07/2023  History of Present Illness  Pt is a 77yo female who was transferred to [facility] from [facility] for cardiac cath,10/28 heart cath canceled due to respiratory failure. Pulmonary edema noted. PMH: mild non-obstructive CAD on ca cardiac catheterization in 08/2017, hypertension, hyperlipidemia, type 2 diabetes mellitus, CKD stage III with solitary kidney, GERD, hiatal hernia, chronic anemia, right breast cancer s/p mastectomy in 2020, and obesity   Clinical Impression  Pt admitted with above. Pt mobilizing with minA and use of RW. Pt with onset of chest pain requiring rest break and then had episode of bilat knee weakness/buckling requiring minA to prevent fall. Pt reports not using AD at home however states "I have to use the buggy at the grocery store or I'll never make it." Pt to benefit from use of RW vs rollator for energy conservation, allow for increased ambulation tolerance, and to minimize fall risk during mobility. Acute PT to cont to follow.      If plan is discharge home, recommend the following: A little help with walking and/or transfers;A little help with bathing/dressing/bathroom;Help with stairs or ramp for entrance   Can travel by private vehicle        Equipment Recommendations Rollator (4 wheels)  Recommendations for Other Services       Functional Status Assessment Patient has had a recent decline in their functional status and demonstrates the ability to make significant improvements in function in a reasonable and predictable amount of time.     Precautions / Restrictions Precautions Precautions: Fall Restrictions Weight Bearing Restrictions: No      Mobility  Bed Mobility               General bed mobility comments: pt received sitting up in chair    Transfers Overall transfer level: Needs assistance Equipment used:  Rolling walker (2 wheels) Transfers: Sit to/from Stand Sit to Stand: Contact guard assist           General transfer comment: pt with good use of hands on arm rests    Ambulation/Gait Ambulation/Gait assistance: Min assist Gait Distance (Feet): 120 Feet Assistive device: Rolling walker (2 wheels) Gait Pattern/deviations: Step-through pattern, Decreased stride length Gait velocity: dec     General Gait Details: pt on 4lO2 via [place], noted DOE, 3/4, pt with onset of pain in chest and became weak in knees requiring minA to prevent fall  Stairs            Wheelchair Mobility     Tilt Bed    Modified Rankin (Stroke Patients Only)       Balance Overall balance assessment: Mild deficits observed, not formally tested (weak knees with onset of fatigue and chest pain during amb)                                           Pertinent Vitals/Pain Pain Assessment Pain Assessment: No/denies pain    Home Living Family/patient expects to be discharged to:: Private residence Living Arrangements: Spouse/significant other Available Help at Discharge: Family;Available 24 hours/day Type of Home: House Home Access: Stairs to enter (1 small step) Entrance Stairs-Rails: None Entrance Stairs-Number of Steps: 1 (short height)   Home Layout: One level        Prior Function Prior Level of Function : Independent/Modified Independent;Driving  Mobility Comments: no AD, no O2, just couldn't lay flat for over a year, would lay propped up on pillows ADLs Comments: indep     Extremity/Trunk Assessment   Upper Extremity Assessment Upper Extremity Assessment: Overall WFL for tasks assessed    Lower Extremity Assessment Lower Extremity Assessment: Generalized weakness    Cervical / Trunk Assessment Cervical / Trunk Assessment: Normal  Communication   Communication Communication: No apparent difficulties  Cognition Arousal: Alert Behavior During  Therapy: WFL for tasks assessed/performed Overall Cognitive Status: Within Functional Limits for tasks assessed                                          General Comments General comments (skin integrity, edema, etc.): VSS, SpO2 > 94% on 4LO2 via [place]    Exercises     Assessment/Plan    PT Assessment Patient needs continued PT services  PT Problem List Decreased strength;Decreased range of motion;Decreased activity tolerance;Decreased mobility;Decreased balance       PT Treatment Interventions DME instruction;Gait training;Stair training;Functional mobility training;Therapeutic activities;Therapeutic exercise;Balance training    PT Goals (Current goals can be found in the Care Plan section)  Acute Rehab PT Goals Patient Stated Goal: get better PT Goal Formulation: With patient Time For Goal Achievement: 05/21/23 Potential to Achieve Goals: Good    Frequency Min 1X/week     Co-evaluation               AM-PAC PT "6 Clicks" Mobility  Outcome Measure Help needed turning from your back to your side while in a flat bed without using bedrails?: A Little Help needed moving from lying on your back to sitting on the side of a flat bed without using bedrails?: A Little Help needed moving to and from a bed to a chair (including a wheelchair)?: A Little Help needed standing up from a chair using your arms (e.g., wheelchair or bedside chair)?: A Little Help needed to walk in hospital room?: A Little Help needed climbing 3-5 steps with a railing? : A Lot 6 Click Score: 17    End of Session Equipment Utilized During Treatment: Gait belt;Oxygen Activity Tolerance: Patient limited by fatigue Patient left: in chair;with call bell/phone within reach;with chair alarm set Nurse Communication: Mobility status PT Visit Diagnosis: Unsteadiness on feet (R26.81);Muscle weakness (generalized) (M62.81)    Time: 2952-8413 PT Time Calculation (min) (ACUTE ONLY): 35  min   Charges:   PT Evaluation $PT Eval Moderate Complexity: 1 Mod PT Treatments $Gait Training: 8-22 mins PT General Charges $$ ACUTE PT VISIT: 1 Visit         Lewis Shock, PT, DPT Acute Rehabilitation Services Secure chat preferred Office #: [phone]   Amber [name] 05/07/2023, 2:28 PM

## 2023-05-07 NOTE — Progress Notes (Signed)
Rounding Note    Patient Name: Amber [name] Date of Encounter: 05/07/2023  [facility] Cardiologist: Marlyn Corporal Madireddy, MD   Subjective   Complains of chest wall discomfort as well as a twisting pressure sensation in her chest  Inpatient Medications    Scheduled Meds:  amLODipine  10 mg Oral Daily   aspirin EC  81 mg Oral Daily   atorvastatin  40 mg Oral Daily   Chlorhexidine Gluconate Cloth  6 each Topical Daily   cholecalciferol  5,000 Units Oral Daily   fluticasone furoate-vilanterol  1 puff Inhalation Daily   hydrALAZINE  50 mg Oral Q8H   insulin aspart  0-15 Units Subcutaneous TID WC   metoprolol succinate  50 mg Oral BID   mouth rinse  15 mL Mouth Rinse 4 times per day   pantoprazole  40 mg Oral Daily   sodium bicarbonate  650 mg Oral Daily   Continuous Infusions:  heparin 1,250 Units/hr (05/07/23 0817)   nitroGLYCERIN Stopped (05/07/23 0616)   PRN Meds: acetaminophen, albuterol, nitroGLYCERIN, ondansetron (ZOFRAN) IV, mouth rinse, mouth rinse   Vital Signs    Vitals:   05/07/23 0615 05/07/23 0630 05/07/23 0645 05/07/23 0700  BP: (!) 136/41 (!) 120/47 (!) 128/47 (!) 151/55  Pulse: (!) 55 (!) 51 (!) 55 62  Resp: 19 19 19 16   Temp:      TempSrc:      SpO2: 98% 98% 98% 98%  Weight:      Height:        Intake/Output Summary (Last 24 hours) at 05/07/2023 0830 Last data filed at 05/07/2023 0817 Gross per 24 hour  Intake 720.48 ml  Output 1150 ml  Net -429.52 ml      05/07/2023    5:40 AM 05/05/2023    3:00 PM 05/03/2023    7:10 PM  Last 3 Weights  Weight (lbs) 215 lb 9.8 oz 193 lb 12.8 oz 189 lb 14.4 oz  Weight (kg) 97.8 kg 87.907 kg 86.138 kg      Telemetry    Sinus in the 80s with occasional PVCs- Personally Reviewed  ECG    05/06/2023 ECG (independently read by me): Normal sinus rhythm at 84 with QS complex V1 through V3 and mild ST abnormality inferiorly.  Physical Exam    BP (!) 151/55   Pulse 62   Temp  98.1 F (36.7 C) (Axillary)   Resp 16   Ht 5\' 5"  (1.651 m)   Wt 97.8 kg   SpO2 98%   BMI 35.88 kg/m  General: Alert, oriented, no distress.  Skin: normal turgor, no rashes, warm and dry HEENT: Normocephalic, atraumatic. Pupils equal round and reactive to light; sclera anicteric; extraocular muscles intact;  Nose without nasal septal hypertrophy Mouth/Parynx benign; Mallinpatti scale 4 Neck: No JVD, no carotid bruits; normal carotid upstroke Lungs: Decreased breath sounds, with expiratory wheezing and rhonchi.  BiPAP was discontinued by respiratory earlier this morning Chest wall:  tenderness to palpitation Heart: PMI not displaced, RRR, s1 s2 normal, 1/6 systolic murmur, no diastolic murmur, no rubs, gallops, thrills, or heaves Abdomen: soft, nontender; no hepatosplenomehaly, BS+; abdominal aorta nontender and not dilated by palpation. Back: no CVA tenderness Pulses 2+ Musculoskeletal: full range of motion, normal strength, no joint deformities Extremities: no clubbing cyanosis or edema, Homan's sign negative  Neurologic: grossly nonfocal; Cranial nerves grossly wnl Psychologic: Normal mood and affect     Labs    High Sensitivity Troponin:   Recent Labs  Lab 05/03/23 2224 05/04/23 0017  TROPONINIHS 4,223* 3,862*     Chemistry Recent Labs  Lab 05/04/23 0732 05/05/23 0452 05/06/23 0639 05/06/23 1805 05/07/23 0317  NA 137   < > 138 139 140  K 4.6   < > 4.2 4.5 4.0  CL 107   < > 112* 108 108  CO2 22   < > 19* 19* 22  GLUCOSE 128*   < > 150* 147* 109*  BUN 46*   < > 31* 30* 29*  CREATININE 2.65*   < > 1.71* 1.82* 1.98*  CALCIUM 8.8*   < > 8.7* 8.6* 8.8*  PROT 5.6*  --   --   --   --   ALBUMIN 3.6  --   --   --   --   AST 33  --   --   --   --   ALT 15  --   --   --   --   ALKPHOS 70  --   --   --   --   BILITOT 0.8  --   --   --   --   GFRNONAA 18*   < > 30* 28* 25*  ANIONGAP 8   < > 7 12 10    < > = values in this interval not displayed.    Lipids No results  for input(s): "CHOL", "TRIG", "HDL", "LABVLDL", "LDLCALC", "CHOLHDL" in the last 168 hours.  Hematology Recent Labs  Lab 05/05/23 0452 05/06/23 0639 05/07/23 0317  WBC 8.7 9.2 10.0  RBC 3.37* 3.40* 3.38*  HGB 9.0* 9.3* 9.2*  HCT 28.5* 29.3* 29.1*  MCV 84.6 86.2 86.1  MCH 26.7 27.4 27.2  MCHC 31.6 31.7 31.6  RDW 14.7 14.6 14.7  PLT 167 198 187   Thyroid No results for input(s): "TSH", "FREET4" in the last 168 hours.  BNP Recent Labs  Lab 05/04/23 0732 05/06/23 0639  BNP 996.7* 1,419.4*    DDimer No results for input(s): "DDIMER" in the last 168 hours.   Radiology    ABORTED INVASIVE LAB PROCEDURE  Result Date: 05/06/2023 Amber [name] presented for right and left heart catheterization for workup of her chest pain and cardiomyopathy.  She was found to be short of breath with increased work of breathing when she first arrived in the Cath Lab but wished to proceed with the procedure.  Shortly after being transferred to the catheterization table, she complained of worsening chest pain and shortness of breath and had to sit up.  Despite this, she continued to exhibit more respiratory distress.  She was moved back to her bed and placed on nonrebreather.  She was also given furosemide with escalating doses of nitroglycerin to treat her uncontrolled hypertension.  Despite these interventions, she continued to be significantly short of breath.  She was placed on BiPAP and given an albuterol nebulizer treatment.  Her procedure was aborted and Amber [name] transferred to the ICU for ongoing care.  When she left the cath lab, her breathing was already starting to improve.  She was also chest pain-free.  I spoke with Dr. Tresa Endo, who has been caring for her in the hospital; he will continue to manage her respiratory failure with hypoxia and NSTEMI.  Once her breathing stabilizes, catheterization can be reattempted.  She she is remain on IV heparin with titration of nitroglycerin infusion for blood  pressure control and anginal relief. Amber Kendall, MD [facility]  DG CHEST PORT 1 VIEW  Result Date:  05/06/2023 CLINICAL DATA:  Respiratory distress. EXAM: PORTABLE CHEST 1 VIEW COMPARISON:  05/01/2023 FINDINGS: Normal sized heart. Aortic arch atheromatous calcifications. Interval diffuse prominence of the interstitial markings with Kerley lines. Mild left basilar linear and patchy density. Unremarkable bones. IMPRESSION: 1. Interval development of interstitial pulmonary edema. 2. Mild left basilar atelectasis or pneumonia. Electronically Signed   By: Beckie Salts M.D.   On: 05/06/2023 15:32    Cardiac Studies   CATH: 08/16/2017 ([facility]): - Ost LAD to Prox LAD lesion is 50% stenosed.   Ost Cx to Prox Cx lesion is 30% stenosed.   Mid RCA lesion is 40% stenosed.     ECHO FROM [facility] 05/02/2023 - LV: LVEF 45-50% with hypokinesis of apical/ septal and apical inferior segments as well as apex. Diastolic function has an impaired relaxation pattern. - RV: Normal size and function. - LA: Mildly dilated. - RA: Normal size. - Mital Valve: Mild regurgitation. No stenosis.  - Aortic Valve: Mild sclerosis but no stenosis. No regurgitation. - Pulmonic Valve: No stenosis or regurgitation. - Tricuspid Valve: Trivial regurgitation. RVSP mildly elevated at 39 mmHg. - Pericardium: No effusion. - Aorta: Aortic root and ascending/ descending aorta were poorly visualized.    Patient Profile     77 y.o. female  with history of mild non-obstructive CAD on ca cardiac catheterization in 08/2017, hypertension, hyperlipidemia, type 2 diabetes mellitus, CKD stage III with solitary kidney, GERD, hiatal hernia, chronic anemia, right breast cancer s/p mastectomy in 2020, and obesity who presented to [facility] on 05/01/2023 from the Cardiology office for further evaluation of chest pain and dyspnea on exertion. Troponin I remained negative at [facility] and she underwent a  Myoview which showed reversible ischemia in LAD distribution. She was transferred to [facility] for further evaluation with cardiac catheterization. High-sensitivity upon arrival to [facility] was elevated and she had now ruled in for a NSTEMI.   Assessment & Plan    NSTEMI: Troponins have increased to 4223.  Patient has experienced chest pain and shortness of breath with exertion for the last 2 to 3 months.  She is on IV heparin.  Apparently her nitroglycerin has been DC'd but this morning with recurrent chest pain is now back on nitroglycerin.  For significantly elevated will titrate to try to keep systolic blood pressure below 119.  She continues to be on aspirin, metoprolol succinate 50 mg twice a day, amlodipine 5 mg and will titrate to 10 mg.  Patient will ultimately need right and left heart cardiac catheterization once stabilizes.  Will need to defer until later in the week if stabilizes. Hypertension: Blood pressure increased to 170s this morning now back on IV nitroglycerin.  She also is on hydralazine 50 mg every 8 hours, will titrate amlodipine to 10 mg, continue metoprolol succinate 50 mg twice a day. CHF: Patient yesterday received furosemide 80 mg and 60 mg increasing shortness of breath and acute respiratory distress.  BNP increased to 1419 yesterday.  Will give additional Lasix today 40 mg IV x 2 .EF on echo at [facility] was 45-50% with hypokinesis of apical/ septal and apical inferior segments as well as apex. Diastolic function has an impaired relaxation pattern. AKI with baseline creatinine around 2.1.  Creatinine had increased to 2.65 on 10/26 and had improved down to 1.71.  Creatinine today 1.98 following diuresis with IV Lasix.  Patient was on losartan at home which has been discontinued with her AKI. Respiratory distress: Yesterday patient developed  using, tachypnea,.  Catheterization was canceled and she was started on BiPAP and given given nebulizer treatment.  On exam today she  still is having some wheezing and rhonchi with decreased breath sounds.  Will ask pulmonary to evaluate today.  BiPAP was discontinued by respiratory this morning. Hyperlipidemia: Had been on pravastatin, will change to atorvastatin 40 mg Type 2 diabetes mellitus.  A1c 5.4.  At home patient was on glimepiride and pioglitazone, currently on hold     For questions or updates, please contact [facility] Please consult [email or web address] for contact info under   CC: 45 minutes     Signed, Nicki Guadalajara, MD  05/07/2023, 8:30 AM

## 2023-05-07 NOTE — Plan of Care (Signed)
  Problem: Education: Goal: Knowledge of General Education information will improve Description Including pain rating scale, medication(s)/side effects and non-pharmacologic comfort measures Outcome: Progressing   

## 2023-05-07 NOTE — Progress Notes (Signed)
PHARMACY - ANTICOAGULATION CONSULT NOTE  Pharmacy Consult for heparin Indication: chest pain/ACS  Allergies  Allergen Reactions   Advil [Ibuprofen] Other (See Comments)    Intolerance due to kidney problem   Aleve [Naproxen] Other (See Comments)    Intolerance due to kidney problem    Patient Measurements: Height: 5\' 5"  (165.1 cm) Weight: 97.8 kg (215 lb 9.8 oz) IBW/kg (Calculated) : 57 Heparin Dosing Weight: 76 kg  Vital Signs: Temp: 98.1 F (36.7 C) (10/29 0352) Temp Source: Axillary (10/29 0352) BP: 151/55 (10/29 0700) Pulse Rate: 62 (10/29 0700)  Labs: Recent Labs    05/05/23 0452 05/06/23 0639 05/06/23 1805 05/07/23 0317  HGB 9.0* 9.3*  --  9.2*  HCT 28.5* 29.3*  --  29.1*  PLT 167 198  --  187  HEPARINUNFRC 0.35 0.27*  --  0.26*  CREATININE 2.47* 1.71* 1.82* 1.98*    Estimated Creatinine Clearance: 26.7 mL/min (A) (by C-G formula based on SCr of 1.98 mg/dL (H)).   Medical History: Past Medical History:  Diagnosis Date   Chronic kidney disease    Diabetes (HCC)    Hypertension     Medications:  Scheduled:   amLODipine  10 mg Oral Daily   aspirin EC  81 mg Oral Daily   atorvastatin  40 mg Oral Daily   Chlorhexidine Gluconate Cloth  6 each Topical Daily   cholecalciferol  5,000 Units Oral Daily   fluticasone furoate-vilanterol  1 puff Inhalation Daily   hydrALAZINE  50 mg Oral Q8H   insulin aspart  0-15 Units Subcutaneous TID WC   metoprolol succinate  50 mg Oral BID   mouth rinse  15 mL Mouth Rinse 4 times per day   pantoprazole  40 mg Oral Daily   sodium bicarbonate  650 mg Oral Daily   Infusions:   heparin 1,250 Units/hr (05/07/23 0817)   nitroGLYCERIN Stopped (05/07/23 2130)    Assessment: 77 yo female presented to [facility] with chest pain. Started on IV heparin on 10/24. Cath cancelled 10/28 due to acute respiratory failure.  -heparin level= 0.26 on 1250 units/hr   Goal of Therapy:  Heparin level 0.3-0.7 units/ml Monitor platelets  by anticoagulation protocol: Yes   Plan:  -Increase heparin to 1400 units/hr -heparin level and CBC in am  Harland German, PharmD Clinical Pharmacist **Pharmacist phone directory can now be found on [email or web address] ([email or web address]).  Listed under [facility].

## 2023-05-08 DIAGNOSIS — E782 Mixed hyperlipidemia: Secondary | ICD-10-CM

## 2023-05-08 DIAGNOSIS — N184 Chronic kidney disease, stage 4 (severe): Secondary | ICD-10-CM

## 2023-05-08 DIAGNOSIS — J9601 Acute respiratory failure with hypoxia: Secondary | ICD-10-CM

## 2023-05-08 DIAGNOSIS — I214 Non-ST elevation (NSTEMI) myocardial infarction: Secondary | ICD-10-CM | POA: Diagnosis not present

## 2023-05-08 DIAGNOSIS — J449 Chronic obstructive pulmonary disease, unspecified: Secondary | ICD-10-CM

## 2023-05-08 HISTORY — DX: Acute respiratory failure with hypoxia: J96.01

## 2023-05-08 HISTORY — DX: Chronic obstructive pulmonary disease, unspecified: J44.9

## 2023-05-08 HISTORY — DX: Chronic kidney disease, stage 4 (severe): N18.4

## 2023-05-08 LAB — CBC
HCT: 29.4 % — ABNORMAL LOW (ref 36.0–46.0)
Hemoglobin: 9.5 g/dL — ABNORMAL LOW (ref 12.0–15.0)
MCH: 27.6 pg (ref 26.0–34.0)
MCHC: 32.3 g/dL (ref 30.0–36.0)
MCV: 85.5 fL (ref 80.0–100.0)
Platelets: 182 10*3/uL (ref 150–400)
RBC: 3.44 MIL/uL — ABNORMAL LOW (ref 3.87–5.11)
RDW: 14.6 % (ref 11.5–15.5)
WBC: 10.3 10*3/uL (ref 4.0–10.5)
nRBC: 0 % (ref 0.0–0.2)

## 2023-05-08 LAB — HEPARIN LEVEL (UNFRACTIONATED)
Heparin Unfractionated: <0.1 [IU]/mL — ABNORMAL LOW (ref 0.30–0.70)
Heparin Unfractionated: 0.44 [IU]/mL (ref 0.30–0.70)

## 2023-05-08 LAB — GLUCOSE, CAPILLARY
Glucose-Capillary: 133 mg/dL — ABNORMAL HIGH (ref 70–99)
Glucose-Capillary: 158 mg/dL — ABNORMAL HIGH (ref 70–99)
Glucose-Capillary: 124 mg/dL — ABNORMAL HIGH (ref 70–99)
Glucose-Capillary: 143 mg/dL — ABNORMAL HIGH (ref 70–99)

## 2023-05-08 LAB — BASIC METABOLIC PANEL
Anion gap: 8 (ref 5–15)
BUN: 32 mg/dL — ABNORMAL HIGH (ref 8–23)
CO2: 24 mmol/L (ref 22–32)
Calcium: 8.6 mg/dL — ABNORMAL LOW (ref 8.9–10.3)
Chloride: 103 mmol/L (ref 98–111)
Creatinine, Ser: 1.92 mg/dL — ABNORMAL HIGH (ref 0.44–1.00)
GFR, Estimated: 26 mL/min — ABNORMAL LOW (ref >60)
Glucose, Bld: 145 mg/dL — ABNORMAL HIGH (ref 70–99)
Potassium: 3.3 mmol/L — ABNORMAL LOW (ref 3.5–5.1)
Sodium: 135 mmol/L (ref 135–145)

## 2023-05-08 LAB — LIPID PANEL
Cholesterol: 126 mg/dL (ref 0–200)
HDL: 33 mg/dL — ABNORMAL LOW (ref >40)
LDL Cholesterol: 66 mg/dL (ref 0–99)
Total CHOL/HDL Ratio: 3.8 {ratio}
Triglycerides: 135 mg/dL (ref <150)
VLDL: 27 mg/dL (ref 0–40)

## 2023-05-08 MED ORDER — POTASSIUM CHLORIDE CRYS ER 20 MEQ PO TBCR: 40.0000 meq | EXTENDED_RELEASE_TABLET | Freq: Once | ORAL | Status: DC

## 2023-05-08 MED ORDER — FUROSEMIDE 10 MG/ML IJ SOLN
40.0000 mg | Freq: Once | INTRAMUSCULAR | Status: AC
  Administered 2023-05-08: 40 mg via INTRAVENOUS
  Filled 2023-05-08: qty 4

## 2023-05-08 MED ORDER — POTASSIUM CHLORIDE 10 MEQ/100ML IV SOLN: 10.0000 meq | INTRAVENOUS | Status: DC

## 2023-05-08 MED ORDER — ASPIRIN 81 MG PO TBEC
81.0000 mg | DELAYED_RELEASE_TABLET | Freq: Every day | ORAL | Status: DC
  Administered 2023-05-10 – 2023-05-12 (×3): 81 mg via ORAL
  Filled 2023-05-08 (×3): qty 1

## 2023-05-08 MED ORDER — POTASSIUM CHLORIDE CRYS ER 20 MEQ PO TBCR
40.0000 meq | EXTENDED_RELEASE_TABLET | Freq: Once | ORAL | Status: AC
  Administered 2023-05-08: 40 meq via ORAL
  Filled 2023-05-08: qty 2

## 2023-05-08 MED ORDER — ASPIRIN 81 MG PO CHEW
81.0000 mg | CHEWABLE_TABLET | ORAL | Status: AC
  Administered 2023-05-09: 81 mg via ORAL
  Filled 2023-05-08: qty 1

## 2023-05-08 MED ORDER — POTASSIUM CHLORIDE 10 MEQ/100ML IV SOLN
10.0000 meq | INTRAVENOUS | Status: AC
Start: 2023-05-08 — End: 2023-05-08
  Administered 2023-05-08 (×2): 10 meq via INTRAVENOUS
  Filled 2023-05-08 (×2): qty 100

## 2023-05-08 MED ORDER — SODIUM CHLORIDE 0.9 % IV SOLN: INTRAVENOUS | Status: DC

## 2023-05-08 NOTE — H&P (View-Only) (Signed)
Rounding Note    Patient Name: Amber [name] Date of Encounter: 05/08/2023  [facility] Cardiologist: Marlyn Corporal Madireddy, MD   Subjective   Breathing better this morning.  No shortness of breath while sitting up.  Inpatient Medications    Scheduled Meds:  amLODipine  10 mg Oral Daily   aspirin EC  81 mg Oral Daily   atorvastatin  40 mg Oral Daily   Chlorhexidine Gluconate Cloth  6 each Topical Daily   cholecalciferol  5,000 Units Oral Daily   fluticasone furoate-vilanterol  1 puff Inhalation Daily   hydrALAZINE  50 mg Oral Q8H   insulin aspart  0-15 Units Subcutaneous TID WC   metoprolol succinate  50 mg Oral BID   mouth rinse  15 mL Mouth Rinse 4 times per day   pantoprazole  40 mg Oral Daily   sodium bicarbonate  650 mg Oral Daily   Continuous Infusions:  heparin 1,400 Units/hr (05/08/23 0700)   nitroGLYCERIN 11 mcg/min (05/08/23 0700)   potassium chloride 10 mEq (05/08/23 0831)   PRN Meds: acetaminophen, ipratropium-albuterol, nitroGLYCERIN, ondansetron (ZOFRAN) IV, mouth rinse, mouth rinse   Vital Signs    Vitals:   05/08/23 0715 05/08/23 0730 05/08/23 0745 05/08/23 0800  BP:   (!) 140/62 (!) 139/57  Pulse: 73 62 70 61  Resp: 18 (!) 23 18 20   Temp:  98.4 F (36.9 C)    TempSrc:  Oral    SpO2: (!) 89% 90% 99% 95%  Weight:      Height:        Intake/Output Summary (Last 24 hours) at 05/08/2023 0854 Last data filed at 05/08/2023 0700 Gross per 24 hour  Intake 381.83 ml  Output 2175 ml  Net -1793.17 ml      05/08/2023    5:00 AM 05/07/2023    5:40 AM 05/05/2023    3:00 PM  Last 3 Weights  Weight (lbs) 181 lb 3.2 oz 215 lb 9.8 oz 193 lb 12.8 oz  Weight (kg) 82.192 kg 97.8 kg 87.907 kg      Telemetry    Sinus in the  60s with rare PVCs- Personally Reviewed  ECG   05/08/2023 ECG (independently read by me): Normal sinus rhythm at 66.  T wave inversion in leads I, aVL, V2 through V4.  05/06/2023 ECG (independently read  by me): Normal sinus rhythm at 84 with QS complex V1 through V3 and mild ST abnormality inferiorly.  Physical Exam   BP (!) 139/57   Pulse 61   Temp 98.4 F (36.9 C) (Oral)   Resp 20   Ht 5\' 5"  (1.651 m)   Wt 82.2 kg   SpO2 95%   BMI 30.15 kg/m  General: Alert, oriented, no distress.  Skin: normal turgor, no rashes, warm and dry HEENT: Normocephalic, atraumatic. Pupils equal round and reactive to light; sclera anicteric; extraocular muscles intact;  Nose without nasal septal hypertrophy Mouth/Parynx benign; Mallinpatti scale 4 Neck: No JVD, no carotid bruits; normal carotid upstroke Lungs: Decreased breath sounds with basilar rales, without previous wheezing Chest wall: Previous chest wall tenderness improved Heart: PMI not displaced, RRR, s1 s2 normal, 1/6 systolic murmur, no diastolic murmur, no rubs, gallops, thrills, or heaves Abdomen: soft, nontender; no hepatosplenomehaly, BS+; abdominal aorta nontender and not dilated by palpation. Back: no CVA tenderness Pulses 2+ Musculoskeletal: full range of motion, normal strength, no joint deformities Extremities: Left arm ecchymosis from IV infiltration Neurologic: grossly nonfocal; Cranial nerves grossly wnl Psychologic: Normal  mood and affect      Labs    High Sensitivity Troponin:   Recent Labs  Lab 05/03/23 2224 05/04/23 0017  TROPONINIHS 4,223* 3,862*     Chemistry Recent Labs  Lab 05/04/23 0732 05/05/23 0452 05/06/23 1805 05/07/23 0317 05/08/23 0313  NA 137   < > 139 140 135  K 4.6   < > 4.5 4.0 3.3*  CL 107   < > 108 108 103  CO2 22   < > 19* 22 24  GLUCOSE 128*   < > 147* 109* 145*  BUN 46*   < > 30* 29* 32*  CREATININE 2.65*   < > 1.82* 1.98* 1.92*  CALCIUM 8.8*   < > 8.6* 8.8* 8.6*  PROT 5.6*  --   --   --   --   ALBUMIN 3.6  --   --   --   --   AST 33  --   --   --   --   ALT 15  --   --   --   --   ALKPHOS 70  --   --   --   --   BILITOT 0.8  --   --   --   --   GFRNONAA 18*   < > 28* 25* 26*   ANIONGAP 8   < > 12 10 8    < > = values in this interval not displayed.    Lipids  Recent Labs  Lab 05/08/23 0313  CHOL 126  TRIG 135  HDL 33*  LDLCALC 66  CHOLHDL 3.8    Hematology Recent Labs  Lab 05/06/23 0639 05/07/23 0317 05/08/23 0313  WBC 9.2 10.0 10.3  RBC 3.40* 3.38* 3.44*  HGB 9.3* 9.2* 9.5*  HCT 29.3* 29.1* 29.4*  MCV 86.2 86.1 85.5  MCH 27.4 27.2 27.6  MCHC 31.7 31.6 32.3  RDW 14.6 14.7 14.6  PLT 198 187 182   Thyroid No results for input(s): "TSH", "FREET4" in the last 168 hours.  BNP Recent Labs  Lab 05/04/23 0732 05/06/23 0639  BNP 996.7* 1,419.4*    DDimer No results for input(s): "DDIMER" in the last 168 hours.   Radiology    DG Chest Port 1V same Day  Result Date: 05/07/2023 CLINICAL DATA:  Chest pain EXAM: PORTABLE CHEST 1 VIEW COMPARISON:  Chest x-ray 04/28/2023 FINDINGS: Strandy opacities in the left lung base are favored is atelectasis. There is no focal lung consolidation, pleural effusion or pneumothorax. The cardiomediastinal silhouette is within normal limits. No acute fractures are seen. IMPRESSION: Strandy opacities in the left lung base are favored is atelectasis. Electronically Signed   By: Darliss Cheney M.D.   On: 05/07/2023 17:09   ABORTED INVASIVE LAB PROCEDURE  Result Date: 05/06/2023 Amber [name] presented for right and left heart catheterization for workup of her chest pain and cardiomyopathy.  She was found to be short of breath with increased work of breathing when she first arrived in the Cath Lab but wished to proceed with the procedure.  Shortly after being transferred to the catheterization table, she complained of worsening chest pain and shortness of breath and had to sit up.  Despite this, she continued to exhibit more respiratory distress.  She was moved back to her bed and placed on nonrebreather.  She was also given furosemide with escalating doses of nitroglycerin to treat her uncontrolled hypertension.  Despite these  interventions, she continued to be significantly short of breath.  She was placed on BiPAP and given an albuterol nebulizer treatment.  Her procedure was aborted and Amber [name] transferred to the ICU for ongoing care.  When she left the cath lab, her breathing was already starting to improve.  She was also chest pain-free.  I spoke with Dr. Tresa Endo, who has been caring for her in the hospital; he will continue to manage her respiratory failure with hypoxia and NSTEMI.  Once her breathing stabilizes, catheterization can be reattempted.  She she is remain on IV heparin with titration of nitroglycerin infusion for blood pressure control and anginal relief. Yvonne Kendall, MD [facility]  DG CHEST PORT 1 VIEW  Result Date: 05/06/2023 CLINICAL DATA:  Respiratory distress. EXAM: PORTABLE CHEST 1 VIEW COMPARISON:  05/01/2023 FINDINGS: Normal sized heart. Aortic arch atheromatous calcifications. Interval diffuse prominence of the interstitial markings with Kerley lines. Mild left basilar linear and patchy density. Unremarkable bones. IMPRESSION: 1. Interval development of interstitial pulmonary edema. 2. Mild left basilar atelectasis or pneumonia. Electronically Signed   By: Beckie Salts M.D.   On: 05/06/2023 15:32    Cardiac Studies   CATH: 08/16/2017 ([facility]): - Ost LAD to Prox LAD lesion is 50% stenosed.   Ost Cx to Prox Cx lesion is 30% stenosed.   Mid RCA lesion is 40% stenosed.     ECHO FROM [facility] 05/02/2023 - LV: LVEF 45-50% with hypokinesis of apical/ septal and apical inferior segments as well as apex. Diastolic function has an impaired relaxation pattern. - RV: Normal size and function. - LA: Mildly dilated. - RA: Normal size. - Mital Valve: Mild regurgitation. No stenosis.  - Aortic Valve: Mild sclerosis but no stenosis. No regurgitation. - Pulmonic Valve: No stenosis or regurgitation. - Tricuspid Valve: Trivial regurgitation. RVSP mildly elevated at 39 mmHg. -  Pericardium: No effusion. - Aorta: Aortic root and ascending/ descending aorta were poorly visualized.    Patient Profile     77 y.o. female  with history of mild non-obstructive CAD on ca cardiac catheterization in 08/2017, hypertension, hyperlipidemia, type 2 diabetes mellitus, CKD stage III with solitary kidney, GERD, hiatal hernia, chronic anemia, right breast cancer s/p mastectomy in 2020, and obesity who presented to [facility] on 05/01/2023 from the Cardiology office for further evaluation of chest pain and dyspnea on exertion. Troponin I remained negative at [facility] and she underwent a Myoview which showed reversible ischemia in LAD distribution. She was transferred to [facility] for further evaluation with cardiac catheterization. High-sensitivity upon arrival to [facility] was elevated and she had now ruled in for a NSTEMI.   Assessment & Plan    NSTEMI: Troponins have increased to 4223.  Patient has experienced chest pain and shortness of breath with exertion for the last 2 to 3 months.  She is on IV heparin and is now back on IV nitroglycerin currently at 11 mics.  She is not having any recurrent chest pain.  She continues to be on aspirin, metoprolol 50 mg twice a day, and amlodipine which was increased to 10 mg yesterday.  Plan for cardiac catheterization but with creatinine today improving but still elevated will defer today in a.m. for tomorrow if stable. Hypertension: Pressure has increased to the 130s and 140s overnight but is improved from previously.  She is also on hydralazine 50 mg every 8 hours and increased amlodipine dose of 10 mg in addition to her previous metoprolol succinate 50 mg twice a day regimen.   CHF: Over  the last several days patient received furosemide 80 and 60 mg 2 days ago, yesterday 40 mg IV x 2, and today will change this to 40 mg x 1.  BNP had increased to 1419.  Previous EF was 45 to 50% on echo at [place] with hypokinesis of the  apical/septal and apical inferior segments as well as apex.  She also had impaired diastolic relaxation.  Recheck BNP in a.m.  AKI with baseline creatinine around 2.1.  Creatinine had increased to 2.65 on 10/26 and had improved down to 1.71.  Creatinine yesterday 1.98 following diuresis with IV Lasix today is 1.92.  Will see if creatinine further improves and tentatively plan for possible right and left heart catheterization tomorrow Respiratory distress:  Catheterization was canceled earlier in the week due to acute respiratory distress.  Appreciate pulmonary evaluation.  Patient now is on BiPAP while sleeping.  He is on Breo and as needed albuterol.  There is no wheezing today but she continues to have decreased breath sounds.   Hypokalemia: Potassium 3.3 getting KCl runs to have another dose when Lasix administered today Hyperlipidemia: Had been on pravastatin, will change to atorvastatin 40 mg; DL this morning 66 Type 2 diabetes mellitus.  A1c 5.4.  At home patient was on glimepiride and pioglitazone, currently on hold     For questions or updates, please contact [facility] Please consult [email or web address] for contact info under       Signed, Nicki Guadalajara, MD  05/08/2023, 8:54 AM

## 2023-05-08 NOTE — Progress Notes (Signed)
PHARMACY - ANTICOAGULATION CONSULT NOTE  Pharmacy Consult for heparin Indication: chest pain/ACS  Allergies  Allergen Reactions   Advil [Ibuprofen] Other (See Comments)    Intolerance due to kidney problem   Aleve [Naproxen] Other (See Comments)    Intolerance due to kidney problem    Patient Measurements: Height: 5\' 5"  (165.1 cm) Weight: 82.2 kg (181 lb 3.2 oz) IBW/kg (Calculated) : 57 Heparin Dosing Weight: 76 kg  Vital Signs: Temp: 97.6 F (36.4 C) (10/30 1600) Temp Source: Oral (10/30 1600) BP: 102/61 (10/30 1700) Pulse Rate: 67 (10/30 1800)  Labs: Recent Labs    05/06/23 0639 05/06/23 1805 05/07/23 0317 05/08/23 0313 05/08/23 1832  HGB 9.3*  --  9.2* 9.5*  --   HCT 29.3*  --  29.1* 29.4*  --   PLT 198  --  187 182  --   HEPARINUNFRC 0.27*  --  0.26* <0.10* 0.44  CREATININE 1.71* 1.82* 1.98* 1.92*  --     Estimated Creatinine Clearance: 25.2 mL/min (A) (by C-G formula based on SCr of 1.92 mg/dL (H)).   Assessment: 77 yo female on heparin for NSTEMI.  Heparin level therapeutic (0.44) on infusion at 1400 units/hr. No bleeding noted.  Goal of Therapy:  Heparin level 0.3-0.7 units/ml Monitor platelets by anticoagulation protocol: Yes   Plan:  Continue heparin at 1600 units/hr F/u a.m. heparin level to confirm therapeutic  Christoper Fabian, PharmD, BCPS Please see amion for complete clinical pharmacist phone list 05/08/2023 7:49 PM

## 2023-05-08 NOTE — Progress Notes (Addendum)
Rounding Note    Patient Name: Amber [name] Date of Encounter: 05/08/2023  [facility] Cardiologist: Marlyn Corporal Madireddy, MD   Subjective   Breathing better this morning.  No shortness of breath while sitting up.  Inpatient Medications    Scheduled Meds:  amLODipine  10 mg Oral Daily   aspirin EC  81 mg Oral Daily   atorvastatin  40 mg Oral Daily   Chlorhexidine Gluconate Cloth  6 each Topical Daily   cholecalciferol  5,000 Units Oral Daily   fluticasone furoate-vilanterol  1 puff Inhalation Daily   hydrALAZINE  50 mg Oral Q8H   insulin aspart  0-15 Units Subcutaneous TID WC   metoprolol succinate  50 mg Oral BID   mouth rinse  15 mL Mouth Rinse 4 times per day   pantoprazole  40 mg Oral Daily   sodium bicarbonate  650 mg Oral Daily   Continuous Infusions:  heparin 1,400 Units/hr (05/08/23 0700)   nitroGLYCERIN 11 mcg/min (05/08/23 0700)   potassium chloride 10 mEq (05/08/23 0831)   PRN Meds: acetaminophen, ipratropium-albuterol, nitroGLYCERIN, ondansetron (ZOFRAN) IV, mouth rinse, mouth rinse   Vital Signs    Vitals:   05/08/23 0715 05/08/23 0730 05/08/23 0745 05/08/23 0800  BP:   (!) 140/62 (!) 139/57  Pulse: 73 62 70 61  Resp: 18 (!) 23 18 20   Temp:  98.4 F (36.9 C)    TempSrc:  Oral    SpO2: (!) 89% 90% 99% 95%  Weight:      Height:        Intake/Output Summary (Last 24 hours) at 05/08/2023 0854 Last data filed at 05/08/2023 0700 Gross per 24 hour  Intake 381.83 ml  Output 2175 ml  Net -1793.17 ml      05/08/2023    5:00 AM 05/07/2023    5:40 AM 05/05/2023    3:00 PM  Last 3 Weights  Weight (lbs) 181 lb 3.2 oz 215 lb 9.8 oz 193 lb 12.8 oz  Weight (kg) 82.192 kg 97.8 kg 87.907 kg      Telemetry    Sinus in the  60s with rare PVCs- Personally Reviewed  ECG   05/08/2023 ECG (independently read by me): Normal sinus rhythm at 66.  T wave inversion in leads I, aVL, V2 through V4.  05/06/2023 ECG (independently read  by me): Normal sinus rhythm at 84 with QS complex V1 through V3 and mild ST abnormality inferiorly.  Physical Exam   BP (!) 139/57   Pulse 61   Temp 98.4 F (36.9 C) (Oral)   Resp 20   Ht 5\' 5"  (1.651 m)   Wt 82.2 kg   SpO2 95%   BMI 30.15 kg/m  General: Alert, oriented, no distress.  Skin: normal turgor, no rashes, warm and dry HEENT: Normocephalic, atraumatic. Pupils equal round and reactive to light; sclera anicteric; extraocular muscles intact;  Nose without nasal septal hypertrophy Mouth/Parynx benign; Mallinpatti scale 4 Neck: No JVD, no carotid bruits; normal carotid upstroke Lungs: Decreased breath sounds with basilar rales, without previous wheezing Chest wall: Previous chest wall tenderness improved Heart: PMI not displaced, RRR, s1 s2 normal, 1/6 systolic murmur, no diastolic murmur, no rubs, gallops, thrills, or heaves Abdomen: soft, nontender; no hepatosplenomehaly, BS+; abdominal aorta nontender and not dilated by palpation. Back: no CVA tenderness Pulses 2+ Musculoskeletal: full range of motion, normal strength, no joint deformities Extremities: Left arm ecchymosis from IV infiltration Neurologic: grossly nonfocal; Cranial nerves grossly wnl Psychologic: Normal  mood and affect      Labs    High Sensitivity Troponin:   Recent Labs  Lab 05/03/23 2224 05/04/23 0017  TROPONINIHS 4,223* 3,862*     Chemistry Recent Labs  Lab 05/04/23 0732 05/05/23 0452 05/06/23 1805 05/07/23 0317 05/08/23 0313  NA 137   < > 139 140 135  K 4.6   < > 4.5 4.0 3.3*  CL 107   < > 108 108 103  CO2 22   < > 19* 22 24  GLUCOSE 128*   < > 147* 109* 145*  BUN 46*   < > 30* 29* 32*  CREATININE 2.65*   < > 1.82* 1.98* 1.92*  CALCIUM 8.8*   < > 8.6* 8.8* 8.6*  PROT 5.6*  --   --   --   --   ALBUMIN 3.6  --   --   --   --   AST 33  --   --   --   --   ALT 15  --   --   --   --   ALKPHOS 70  --   --   --   --   BILITOT 0.8  --   --   --   --   GFRNONAA 18*   < > 28* 25* 26*   ANIONGAP 8   < > 12 10 8    < > = values in this interval not displayed.    Lipids  Recent Labs  Lab 05/08/23 0313  CHOL 126  TRIG 135  HDL 33*  LDLCALC 66  CHOLHDL 3.8    Hematology Recent Labs  Lab 05/06/23 0639 05/07/23 0317 05/08/23 0313  WBC 9.2 10.0 10.3  RBC 3.40* 3.38* 3.44*  HGB 9.3* 9.2* 9.5*  HCT 29.3* 29.1* 29.4*  MCV 86.2 86.1 85.5  MCH 27.4 27.2 27.6  MCHC 31.7 31.6 32.3  RDW 14.6 14.7 14.6  PLT 198 187 182   Thyroid No results for input(s): "TSH", "FREET4" in the last 168 hours.  BNP Recent Labs  Lab 05/04/23 0732 05/06/23 0639  BNP 996.7* 1,419.4*    DDimer No results for input(s): "DDIMER" in the last 168 hours.   Radiology    DG Chest Port 1V same Day  Result Date: 05/07/2023 CLINICAL DATA:  Chest pain EXAM: PORTABLE CHEST 1 VIEW COMPARISON:  Chest x-ray 04/28/2023 FINDINGS: Strandy opacities in the left lung base are favored is atelectasis. There is no focal lung consolidation, pleural effusion or pneumothorax. The cardiomediastinal silhouette is within normal limits. No acute fractures are seen. IMPRESSION: Strandy opacities in the left lung base are favored is atelectasis. Electronically Signed   By: Darliss Cheney M.D.   On: 05/07/2023 17:09   ABORTED INVASIVE LAB PROCEDURE  Result Date: 05/06/2023 Amber [name] presented for right and left heart catheterization for workup of her chest pain and cardiomyopathy.  She was found to be short of breath with increased work of breathing when she first arrived in the Cath Lab but wished to proceed with the procedure.  Shortly after being transferred to the catheterization table, she complained of worsening chest pain and shortness of breath and had to sit up.  Despite this, she continued to exhibit more respiratory distress.  She was moved back to her bed and placed on nonrebreather.  She was also given furosemide with escalating doses of nitroglycerin to treat her uncontrolled hypertension.  Despite these  interventions, she continued to be significantly short of breath.  She was placed on BiPAP and given an albuterol nebulizer treatment.  Her procedure was aborted and Amber [name] transferred to the ICU for ongoing care.  When she left the cath lab, her breathing was already starting to improve.  She was also chest pain-free.  I spoke with Dr. Tresa Endo, who has been caring for her in the hospital; he will continue to manage her respiratory failure with hypoxia and NSTEMI.  Once her breathing stabilizes, catheterization can be reattempted.  She she is remain on IV heparin with titration of nitroglycerin infusion for blood pressure control and anginal relief. Yvonne Kendall, MD [facility]  DG CHEST PORT 1 VIEW  Result Date: 05/06/2023 CLINICAL DATA:  Respiratory distress. EXAM: PORTABLE CHEST 1 VIEW COMPARISON:  05/01/2023 FINDINGS: Normal sized heart. Aortic arch atheromatous calcifications. Interval diffuse prominence of the interstitial markings with Kerley lines. Mild left basilar linear and patchy density. Unremarkable bones. IMPRESSION: 1. Interval development of interstitial pulmonary edema. 2. Mild left basilar atelectasis or pneumonia. Electronically Signed   By: Beckie Salts M.D.   On: 05/06/2023 15:32    Cardiac Studies   CATH: 08/16/2017 ([facility]): - Ost LAD to Prox LAD lesion is 50% stenosed.   Ost Cx to Prox Cx lesion is 30% stenosed.   Mid RCA lesion is 40% stenosed.     ECHO FROM [facility] 05/02/2023 - LV: LVEF 45-50% with hypokinesis of apical/ septal and apical inferior segments as well as apex. Diastolic function has an impaired relaxation pattern. - RV: Normal size and function. - LA: Mildly dilated. - RA: Normal size. - Mital Valve: Mild regurgitation. No stenosis.  - Aortic Valve: Mild sclerosis but no stenosis. No regurgitation. - Pulmonic Valve: No stenosis or regurgitation. - Tricuspid Valve: Trivial regurgitation. RVSP mildly elevated at 39 mmHg. -  Pericardium: No effusion. - Aorta: Aortic root and ascending/ descending aorta were poorly visualized.    Patient Profile     77 y.o. female  with history of mild non-obstructive CAD on ca cardiac catheterization in 08/2017, hypertension, hyperlipidemia, type 2 diabetes mellitus, CKD stage III with solitary kidney, GERD, hiatal hernia, chronic anemia, right breast cancer s/p mastectomy in 2020, and obesity who presented to [facility] on 05/01/2023 from the Cardiology office for further evaluation of chest pain and dyspnea on exertion. Troponin I remained negative at [facility] and she underwent a Myoview which showed reversible ischemia in LAD distribution. She was transferred to [facility] for further evaluation with cardiac catheterization. High-sensitivity upon arrival to [facility] was elevated and she had now ruled in for a NSTEMI.   Assessment & Plan    NSTEMI: Troponins have increased to 4223.  Patient has experienced chest pain and shortness of breath with exertion for the last 2 to 3 months.  She is on IV heparin and is now back on IV nitroglycerin currently at 11 mics.  She is not having any recurrent chest pain.  She continues to be on aspirin, metoprolol 50 mg twice a day, and amlodipine which was increased to 10 mg yesterday.  Plan for cardiac catheterization but with creatinine today improving but still elevated will defer today in a.m. for tomorrow if stable. Hypertension: Pressure has increased to the 130s and 140s overnight but is improved from previously.  She is also on hydralazine 50 mg every 8 hours and increased amlodipine dose of 10 mg in addition to her previous metoprolol succinate 50 mg twice a day regimen.   CHF: Over  the last several days patient received furosemide 80 and 60 mg 2 days ago, yesterday 40 mg IV x 2, and today will change this to 40 mg x 1.  BNP had increased to 1419.  Previous EF was 45 to 50% on echo at [place] with hypokinesis of the  apical/septal and apical inferior segments as well as apex.  She also had impaired diastolic relaxation.  Recheck BNP in a.m.  AKI with baseline creatinine around 2.1.  Creatinine had increased to 2.65 on 10/26 and had improved down to 1.71.  Creatinine yesterday 1.98 following diuresis with IV Lasix today is 1.92.  Will see if creatinine further improves and tentatively plan for possible right and left heart catheterization tomorrow Respiratory distress:  Catheterization was canceled earlier in the week due to acute respiratory distress.  Appreciate pulmonary evaluation.  Patient now is on BiPAP while sleeping.  He is on Breo and as needed albuterol.  There is no wheezing today but she continues to have decreased breath sounds.   Hypokalemia: Potassium 3.3 getting KCl runs to have another dose when Lasix administered today Hyperlipidemia: Had been on pravastatin, will change to atorvastatin 40 mg; DL this morning 66 Type 2 diabetes mellitus.  A1c 5.4.  At home patient was on glimepiride and pioglitazone, currently on hold     For questions or updates, please contact [facility] Please consult [email or web address] for contact info under       Signed, Nicki Guadalajara, MD  05/08/2023, 8:54 AM

## 2023-05-08 NOTE — Progress Notes (Signed)
NAME:  Amber [name], MRN:  [id number], DOB:  12-31-1945, LOS: 5 ADMISSION DATE:  05/03/2023, CONSULTATION DATE:  10/29 REFERRING MD:  Dr. Jenene [name], CHIEF COMPLAINT:  chest pain   History of Present Illness:  Patient is a 77 yo F w/ pertinent PMH CAD, HTN, HLD, T2DM, CKD stage 3b, COPD presents to [facility] on 10/25 w/ chest pain.  Patient admitted to [facility] on 10/23 from cardiology office with chest pain and dyspnea on exertion. Troponin 1 negative x5. Echo showing lvef 45-50% with hypokinesis of apical/septal and apical inferior segments as well as apex. Myoview showed reversible ischemia in LAD and was transferred to [facility] on 10/25 for cardiology evaluation w/ heart cath. Trop now elevated at 4,223 then 3,862. Started on heparin, asa for NSTEMI. On nitroglycerin drip for HTN urgency.   On 10/28 heart cath canceled due to respiratory failure and inability to lay flat. Patient started on bipap and given 80 IV lasix w/ 850 cc UOP. CXR w/ pulmonary edema. 10/29 pccm consulted.  Pertinent  Medical History   Past Medical History:  Diagnosis Date   Chronic kidney disease    Diabetes (HCC)    Hypertension     Significant Hospital Events: Including procedures, antibiotic start and stop dates in addition to other pertinent events   10/25 admitted w/ NSTEMI 10/28 failed to get heart cath due to dyspnea 10/29 pccm consulted 10/30 no acute events overnight, oxygen weaned to 2 L nasal cannula  Interim History / Subjective:  Seen sitting up in bedside recliner with no acute complaints.  Is hopeful to complete heart catheterization today  Objective   Blood pressure (!) 145/60, pulse 62, temperature 98.9 F (37.2 C), temperature source Oral, resp. rate 20, height 5\' 5"  (1.651 m), weight 97.8 kg, SpO2 98%.    Vent Mode: BIPAP;PCV FiO2 (%):  [40 %] 40 % Set Rate:  [12 bmp] 12 bmp   Intake/Output Summary (Last 24 hours) at 05/08/2023 0715 Last data filed at 05/08/2023 0400 Gross  per 24 hour  Intake 336.21 ml  Output 2125 ml  Net -1788.79 ml   Filed Weights   05/03/23 1910 05/05/23 1500 05/07/23 0540  Weight: 86.1 kg 87.9 kg 97.8 kg    Examination: General: Acute on chronic ill-appearing elderly female sitting up in bedside recliner no acute distress HEENT: [place]/AT, MM pink/moist, PERRL,  Neuro: Alert and oriented x 3, nonfocal CV: s1s2 regular rate and rhythm, no murmur, rubs, or gallops,  PULM: Clear to auscultation bilaterally, slightly diminished, no increased work of breathing, now on 2 L nasal cannula GI: soft, bowel sounds active in all 4 quadrants, non-tender, non-distended Extremities: warm/dry, no edema  Skin: no rashes or lesions  Resolved Hospital Problem list     Assessment & Plan:  Acute respiratory failure w/ hypoxia Pulmonary edema from CHF Hx of COPD -on breo ellipta; not seen by pulmonary outpt Previous hx of tobacco abuse -smoked for 40 years pack a day; quit 10 years ago P: Oxygen weaned to 2 L nasal cannula BIPAP at bedtime and PRN  Diurese as below  Scheduled and PRN bronchodilators  Pulmonary hygiene  Mobilize as able  NSTEMI -High-sensitivity troponin peaked at 4223 CAD HFmrEF -Echo showing lvef 45-50% with hypokinesis of apical/septal and apical inferior segments as well as apex. Myoview showed reversible ischemia in LAD  HTN urgency HLD P: Continuous telemetry  Continue IV heparin and aspirin Heart health diet with sodium restriction  Strict intake and output  Nitroglycerin drip  per cardiology Goal of SBP less than 140 Daily weight to assess volume status Daily assessment for need to diurese  Closely monitor renal function and electrolytes  Provide supplemtal oxygen as above  GDMT as able  AKI on CKD 3b -Creat today 1.98 from 1.82 yesterday (baseline aroudn 1.8-2.2) P: Follow renal function  Monitor urine output Trend Bmet Avoid nephrotoxins Ensure adequate renal perfusion   T2DM P: Continue SSI CBG  goal 140-180 CBG checks every 4  Chronic anemia P: Trend CBC Transfuse per protocol Hemoglobin goal greater than 7  GERD Hiatal hernia P: Continue PPI  Hypokalemia P: Supplement as needed Trend BMP  Best Practice (right click and "Reselect all SmartList Selections" daily)   Diet/type: Regular consistency (see orders) DVT prophylaxis: systemic heparin GI prophylaxis: PPI Lines: N/A Foley:  N/A Code Status:  full code Last date of multidisciplinary goals of care discussion: Continue current aggressive interventions, update patient and family daily  Critical care time:   CRITICAL CARE Performed by: Amber [name]   Total critical care time: 38 minutes  Critical care time was exclusive of separately billable procedures and treating other patients.  Critical care was necessary to treat or prevent imminent or life-threatening deterioration.  Critical care was time spent personally by me on the following activities: development of treatment plan with patient and/or surrogate as well as nursing, discussions with consultants, evaluation of patient's response to treatment, examination of patient, obtaining history from patient or surrogate, ordering and performing treatments and interventions, ordering and review of laboratory studies, ordering and review of radiographic studies, pulse oximetry and re-evaluation of patient's condition.  Amber Barich D. Harris, NP-C [facility] Personal contact information can be found on Amion  If no contact or response made please call [phone] 05/08/2023, 8:05 AM

## 2023-05-08 NOTE — Progress Notes (Addendum)
PHARMACY - ANTICOAGULATION CONSULT NOTE  Pharmacy Consult for heparin Indication: chest pain/ACS  Allergies  Allergen Reactions   Advil [Ibuprofen] Other (See Comments)    Intolerance due to kidney problem   Aleve [Naproxen] Other (See Comments)    Intolerance due to kidney problem    Patient Measurements: Height: Amber\' Amber"  (165.1 cm) Weight: 97.8 kg (215 lb 9.8 oz) IBW/kg (Calculated) : 57 Heparin Dosing Weight: 76 kg  Vital Signs: Temp: 98.9 F (37.2 C) (10/29 2330) Temp Source: Oral (10/29 2330) BP: 133/53 (10/30 0000) Pulse Rate: 66 (10/30 0000)  Labs: Recent Labs    05/06/23 0639 05/06/23 1805 05/07/23 0317 05/08/23 0313  HGB 9.3*  --  9.2* 9.Amber*  HCT 29.3*  --  29.1* 29.4*  PLT 198  --  187 182  HEPARINUNFRC 0.27*  --  0.26* <0.10*  CREATININE 1.71* 1.82* 1.98* 1.92*    Estimated Creatinine Clearance: 27.Amber mL/min (A) (by C-G formula based on SCr of 1.92 mg/dL (H)).   Medical History: Past Medical History:  Diagnosis Date   Chronic kidney disease    Diabetes (HCC)    Hypertension     Medications:  Scheduled:   amLODipine  10 mg Oral Daily   aspirin EC  81 mg Oral Daily   atorvastatin  40 mg Oral Daily   Chlorhexidine Gluconate Cloth  6 each Topical Daily   cholecalciferol  Amber,000 Units Oral Daily   fluticasone furoate-vilanterol  1 puff Inhalation Daily   hydrALAZINE  50 mg Oral Q8H   insulin aspart  0-15 Units Subcutaneous TID WC   metoprolol succinate  50 mg Oral BID   mouth rinse  15 mL Mouth Rinse 4 times per day   pantoprazole  40 mg Oral Daily   sodium bicarbonate  650 mg Oral Daily   Infusions:   heparin 1,400 Units/hr (05/08/23 0000)   nitroGLYCERIN 11 mcg/min (05/08/23 0000)    Assessment: 77 yo Amber [name] presented to [place] with chest pain. Started on IV heparin on 10/24. Cath cancelled 10/28 due to acute respiratory failure.  -heparin level returned <0.1 on 1400 units/hr. Per RN, no issues with the heparin infusion running  continuously or signs/symptoms of bleeding . Paused for phlebotomist to drawn heparin level, but restarted once lab was drawn Hgb and plts low, stable  Goal of Therapy:  Heparin level 0.3-0.7 units/ml Monitor platelets by anticoagulation protocol: Yes   Plan:  -Increase heparin to 1600 units/hr -8h heparin level -heparin level and CBC in am  [name], PharmD. Clinical Pharmacist 05/08/2023 4:07 AM   ADDENDUM -RN called to state heparin extravasated and was not infusing into the patient, as to why the level was most likely low/ undetectable. RN to change out the IV and will continue the heparin. Given not true level at steady state at 1400 units/hr, will decrease back to rate and check level  Plan: -Decrease heparin to 1400 units/hr -Follow up heparin level in 8 hours -CBC and heparin level daily  [name], PharmD. Clinical Pharmacist 05/08/2023 6:40 AM

## 2023-05-09 ENCOUNTER — Encounter (HOSPITAL_COMMUNITY): Payer: Self-pay | Admitting: Internal Medicine

## 2023-05-09 ENCOUNTER — Encounter (HOSPITAL_COMMUNITY)
Admission: RE | Disposition: A | Discharge status: Home / Self Care | Payer: Self-pay | Source: Other Acute Inpatient Hospital | Attending: Internal Medicine

## 2023-05-09 DIAGNOSIS — J449 Chronic obstructive pulmonary disease, unspecified: Secondary | ICD-10-CM | POA: Diagnosis not present

## 2023-05-09 DIAGNOSIS — J9601 Acute respiratory failure with hypoxia: Secondary | ICD-10-CM | POA: Diagnosis not present

## 2023-05-09 DIAGNOSIS — I2510 Atherosclerotic heart disease of native coronary artery without angina pectoris: Secondary | ICD-10-CM

## 2023-05-09 DIAGNOSIS — E782 Mixed hyperlipidemia: Secondary | ICD-10-CM | POA: Diagnosis not present

## 2023-05-09 DIAGNOSIS — N184 Chronic kidney disease, stage 4 (severe): Secondary | ICD-10-CM | POA: Diagnosis not present

## 2023-05-09 DIAGNOSIS — I214 Non-ST elevation (NSTEMI) myocardial infarction: Secondary | ICD-10-CM | POA: Diagnosis not present

## 2023-05-09 HISTORY — PX: RIGHT/LEFT HEART CATH AND CORONARY ANGIOGRAPHY: CATH118266

## 2023-05-09 LAB — POCT I-STAT EG7
Acid-Base Excess: 1 mmol/L (ref 0.0–2.0)
Acid-Base Excess: 1 mmol/L (ref 0.0–2.0)
Acid-Base Excess: 2 mmol/L (ref 0.0–2.0)
Acid-Base Excess: 3 mmol/L — ABNORMAL HIGH (ref 0.0–2.0)
Bicarbonate: 26 mmol/L (ref 20.0–28.0)
Bicarbonate: 26.7 mmol/L (ref 20.0–28.0)
Bicarbonate: 27.4 mmol/L (ref 20.0–28.0)
Bicarbonate: 27.6 mmol/L (ref 20.0–28.0)
Calcium, Ion: 1.18 mmol/L (ref 1.15–1.40)
Calcium, Ion: 1.21 mmol/L (ref 1.15–1.40)
Calcium, Ion: 1.21 mmol/L (ref 1.15–1.40)
Calcium, Ion: 1.22 mmol/L (ref 1.15–1.40)
HCT: 26 % — ABNORMAL LOW (ref 36.0–46.0)
HCT: 26 % — ABNORMAL LOW (ref 36.0–46.0)
HCT: 26 % — ABNORMAL LOW (ref 36.0–46.0)
HCT: 26 % — ABNORMAL LOW (ref 36.0–46.0)
Hemoglobin: 8.8 g/dL — ABNORMAL LOW (ref 12.0–15.0)
Hemoglobin: 8.8 g/dL — ABNORMAL LOW (ref 12.0–15.0)
Hemoglobin: 8.8 g/dL — ABNORMAL LOW (ref 12.0–15.0)
Hemoglobin: 8.8 g/dL — ABNORMAL LOW (ref 12.0–15.0)
O2 Saturation: 57 %
O2 Saturation: 60 %
O2 Saturation: 63 %
O2 Saturation: 67 %
Potassium: 3.7 mmol/L (ref 3.5–5.1)
Potassium: 3.7 mmol/L (ref 3.5–5.1)
Potassium: 3.8 mmol/L (ref 3.5–5.1)
Potassium: 3.8 mmol/L (ref 3.5–5.1)
Sodium: 139 mmol/L (ref 135–145)
Sodium: 139 mmol/L (ref 135–145)
Sodium: 140 mmol/L (ref 135–145)
Sodium: 141 mmol/L (ref 135–145)
TCO2: 27 mmol/L (ref 22–32)
TCO2: 28 mmol/L (ref 22–32)
TCO2: 29 mmol/L (ref 22–32)
TCO2: 29 mmol/L (ref 22–32)
pCO2, Ven: 43.6 mm[Hg] — ABNORMAL LOW (ref 44–60)
pCO2, Ven: 44.2 mm[Hg] (ref 44–60)
pCO2, Ven: 44.3 mm[Hg] (ref 44–60)
pCO2, Ven: 45.3 mm[Hg] (ref 44–60)
pH, Ven: 7.379 (ref 7.25–7.43)
pH, Ven: 7.383 (ref 7.25–7.43)
pH, Ven: 7.4 (ref 7.25–7.43)
pH, Ven: 7.404 (ref 7.25–7.43)
pO2, Ven: 31 mm[Hg] — CL (ref 32–45)
pO2, Ven: 32 mm[Hg] (ref 32–45)
pO2, Ven: 33 mm[Hg] (ref 32–45)
pO2, Ven: 35 mm[Hg] (ref 32–45)

## 2023-05-09 LAB — POCT I-STAT 7, (LYTES, BLD GAS, ICA,H+H)
Acid-Base Excess: 2 mmol/L (ref 0.0–2.0)
Bicarbonate: 26.6 mmol/L (ref 20.0–28.0)
Calcium, Ion: 1.23 mmol/L (ref 1.15–1.40)
HCT: 27 % — ABNORMAL LOW (ref 36.0–46.0)
Hemoglobin: 9.2 g/dL — ABNORMAL LOW (ref 12.0–15.0)
O2 Saturation: 96 %
Potassium: 3.9 mmol/L (ref 3.5–5.1)
Sodium: 139 mmol/L (ref 135–145)
TCO2: 28 mmol/L (ref 22–32)
pCO2 arterial: 41.3 mm[Hg] (ref 32–48)
pH, Arterial: 7.417 (ref 7.35–7.45)
pO2, Arterial: 81 mm[Hg] — ABNORMAL LOW (ref 83–108)

## 2023-05-09 LAB — CBC
HCT: 28.9 % — ABNORMAL LOW (ref 36.0–46.0)
Hemoglobin: 9.2 g/dL — ABNORMAL LOW (ref 12.0–15.0)
MCH: 26.9 pg (ref 26.0–34.0)
MCHC: 31.8 g/dL (ref 30.0–36.0)
MCV: 84.5 fL (ref 80.0–100.0)
Platelets: 199 10*3/uL (ref 150–400)
RBC: 3.42 MIL/uL — ABNORMAL LOW (ref 3.87–5.11)
RDW: 14.6 % (ref 11.5–15.5)
WBC: 9.2 10*3/uL (ref 4.0–10.5)
nRBC: 0 % (ref 0.0–0.2)

## 2023-05-09 LAB — GLUCOSE, CAPILLARY
Glucose-Capillary: 158 mg/dL — ABNORMAL HIGH (ref 70–99)
Glucose-Capillary: 132 mg/dL — ABNORMAL HIGH (ref 70–99)
Glucose-Capillary: 106 mg/dL — ABNORMAL HIGH (ref 70–99)
Glucose-Capillary: 143 mg/dL — ABNORMAL HIGH (ref 70–99)

## 2023-05-09 LAB — BASIC METABOLIC PANEL
Anion gap: 10 (ref 5–15)
BUN: 43 mg/dL — ABNORMAL HIGH (ref 8–23)
CO2: 24 mmol/L (ref 22–32)
Calcium: 8.7 mg/dL — ABNORMAL LOW (ref 8.9–10.3)
Chloride: 102 mmol/L (ref 98–111)
Creatinine, Ser: 1.98 mg/dL — ABNORMAL HIGH (ref 0.44–1.00)
GFR, Estimated: 25 mL/min — ABNORMAL LOW (ref >60)
Glucose, Bld: 150 mg/dL — ABNORMAL HIGH (ref 70–99)
Potassium: 3.8 mmol/L (ref 3.5–5.1)
Sodium: 136 mmol/L (ref 135–145)

## 2023-05-09 LAB — HEPARIN LEVEL (UNFRACTIONATED): Heparin Unfractionated: 0.39 [IU]/mL (ref 0.30–0.70)

## 2023-05-09 LAB — BRAIN NATRIURETIC PEPTIDE: B Natriuretic Peptide: 836.4 pg/mL — ABNORMAL HIGH (ref 0.0–100.0)

## 2023-05-09 SURGERY — RIGHT/LEFT HEART CATH AND CORONARY ANGIOGRAPHY
Anesthesia: LOCAL

## 2023-05-09 MED ORDER — LIDOCAINE HCL (PF) 1 % IJ SOLN
INTRAMUSCULAR | Status: DC | PRN
  Administered 2023-05-09: 10 mL
  Administered 2023-05-09: 2 mL

## 2023-05-09 MED ORDER — LABETALOL HCL 5 MG/ML IV SOLN
10.0000 mg | INTRAVENOUS | Status: AC | PRN
Start: 2023-05-09 — End: 2023-05-09

## 2023-05-09 MED ORDER — LIDOCAINE HCL (PF) 1 % IJ SOLN
INTRAMUSCULAR | Status: AC
  Filled 2023-05-09: qty 30

## 2023-05-09 MED ORDER — SODIUM CHLORIDE 0.9% FLUSH
3.0000 mL | Freq: Two times a day (BID) | INTRAVENOUS | Status: DC
  Administered 2023-05-10 – 2023-05-12 (×3): 3 mL via INTRAVENOUS

## 2023-05-09 MED ORDER — MIDAZOLAM HCL 2 MG/2ML IJ SOLN
INTRAMUSCULAR | Status: DC | PRN
  Administered 2023-05-09 (×2): 1 mg via INTRAVENOUS

## 2023-05-09 MED ORDER — HEPARIN (PORCINE) IN NACL 1000-0.9 UT/500ML-% IV SOLN
INTRAVENOUS | Status: DC | PRN
  Administered 2023-05-09 (×2): 500 mL

## 2023-05-09 MED ORDER — VERAPAMIL HCL 2.5 MG/ML IV SOLN
INTRAVENOUS | Status: AC
  Filled 2023-05-09: qty 2

## 2023-05-09 MED ORDER — MIDAZOLAM HCL 2 MG/2ML IJ SOLN
INTRAMUSCULAR | Status: AC
  Filled 2023-05-09: qty 2

## 2023-05-09 MED ORDER — HYDRALAZINE HCL 20 MG/ML IJ SOLN: 10.0000 mg | INTRAMUSCULAR | Status: AC | PRN

## 2023-05-09 MED ORDER — VERAPAMIL HCL 2.5 MG/ML IV SOLN
INTRAVENOUS | Status: DC | PRN
  Administered 2023-05-09: 10 mL via INTRA_ARTERIAL

## 2023-05-09 MED ORDER — HEPARIN SODIUM (PORCINE) 1000 UNIT/ML IJ SOLN
INTRAMUSCULAR | Status: DC | PRN
  Administered 2023-05-09: 5000 [IU] via INTRAVENOUS

## 2023-05-09 MED ORDER — IOHEXOL 350 MG/ML SOLN
INTRAVENOUS | Status: DC | PRN
  Administered 2023-05-09: 50 mL via INTRA_ARTERIAL

## 2023-05-09 MED ORDER — NITROGLYCERIN 1 MG/10 ML FOR IR/CATH LAB
INTRA_ARTERIAL | Status: AC
  Filled 2023-05-09: qty 10

## 2023-05-09 MED ORDER — SODIUM CHLORIDE 0.9 % IV SOLN
250.0000 mL | INTRAVENOUS | Status: AC | PRN
Start: 2023-05-09 — End: 2023-05-10

## 2023-05-09 MED ORDER — HEPARIN (PORCINE) 25000 UT/250ML-% IV SOLN
1500.0000 [IU]/h | INTRAVENOUS | Status: DC
  Administered 2023-05-09: 1400 [IU]/h via INTRAVENOUS
  Filled 2023-05-09: qty 250

## 2023-05-09 MED ORDER — HEPARIN SODIUM (PORCINE) 1000 UNIT/ML IJ SOLN
INTRAMUSCULAR | Status: AC
  Filled 2023-05-09: qty 10

## 2023-05-09 MED ORDER — HYDRALAZINE HCL 50 MG PO TABS
75.0000 mg | ORAL_TABLET | Freq: Three times a day (TID) | ORAL | Status: DC
  Administered 2023-05-09 – 2023-05-12 (×9): 75 mg via ORAL
  Filled 2023-05-09 (×9): qty 1

## 2023-05-09 MED ORDER — SODIUM CHLORIDE 0.9% FLUSH
3.0000 mL | INTRAVENOUS | Status: DC | PRN
Start: 2023-05-09 — End: 2023-05-12

## 2023-05-09 SURGICAL SUPPLY — 16 items
CATH BALLN WEDGE 5F 110CM (CATHETERS) IMPLANT
CATH INFINITI 6F ANG MULTIPACK (CATHETERS) IMPLANT
CATH LAUNCHER 6FR EBU3.5 (CATHETERS) IMPLANT
DEVICE RAD COMP TR BAND LRG (VASCULAR PRODUCTS) IMPLANT
ELECT DEFIB PAD ADLT CADENCE (PAD) IMPLANT
GLIDESHEATH SLEND SS 6F .021 (SHEATH) IMPLANT
KIT ENCORE 26 ADVANTAGE (KITS) IMPLANT
PACK CARDIAC CATHETERIZATION (CUSTOM PROCEDURE TRAY) ×1 IMPLANT
SET ATX-X65L (MISCELLANEOUS) IMPLANT
SHEATH GLIDE SLENDER 4/5FR (SHEATH) IMPLANT
SHEATH PINNACLE 7F 10CM (SHEATH) IMPLANT
SHEATH PROBE COVER 6X72 (BAG) IMPLANT
WIRE EMERALD 3MM-J .035X260CM (WIRE) IMPLANT
WIRE HI TORQ VERSACORE-J 145CM (WIRE) IMPLANT
WIRE MICRO SET 5FR 12 (WIRE) IMPLANT
WIRE MICRO SET SILHO 5FR 7 (SHEATH) IMPLANT

## 2023-05-09 NOTE — Progress Notes (Signed)
Rounding Note    Patient Name: Amber [name] Date of Encounter: 05/09/2023  [facility] Cardiologist: Marlyn Corporal Madireddy, MD   Subjective   Pt feels much better today; no shortness of breath when sitting up.  Inpatient Medications    Scheduled Meds:  amLODipine  10 mg Oral Daily   [START ON 05/10/2023] aspirin EC  81 mg Oral Daily   atorvastatin  40 mg Oral Daily   Chlorhexidine Gluconate Cloth  6 each Topical Daily   cholecalciferol  5,000 Units Oral Daily   fluticasone furoate-vilanterol  1 puff Inhalation Daily   hydrALAZINE  50 mg Oral Q8H   insulin aspart  0-15 Units Subcutaneous TID WC   metoprolol succinate  50 mg Oral BID   mouth rinse  15 mL Mouth Rinse 4 times per day   pantoprazole  40 mg Oral Daily   sodium bicarbonate  650 mg Oral Daily   Continuous Infusions:  sodium chloride 50 mL/hr at 05/09/23 0600   heparin 1,400 Units/hr (05/09/23 0600)   nitroGLYCERIN 11 mcg/min (05/09/23 0600)   PRN Meds: acetaminophen, ipratropium-albuterol, nitroGLYCERIN, ondansetron (ZOFRAN) IV, mouth rinse, mouth rinse   Vital Signs    Vitals:   05/09/23 0400 05/09/23 0500 05/09/23 0600 05/09/23 0700  BP: (!) 124/50 (!) 154/57 (!) 117/50 (!) 124/51  Pulse: (!) 59 63 (!) 57 (!) 59  Resp: (!) 25 (!) 27 (!) 21 (!) 24  Temp:      TempSrc:      SpO2: 95% 96% 97% 95%  Weight:  84 kg    Height:        Intake/Output Summary (Last 24 hours) at 05/09/2023 0813 Last data filed at 05/09/2023 0600 Gross per 24 hour  Intake 992.44 ml  Output 1275 ml  Net -282.56 ml   I/O -288.7 since admission      05/09/2023    5:00 AM 05/08/2023    5:00 AM 05/07/2023    5:40 AM  Last 3 Weights  Weight (lbs) 185 lb 3 oz 181 lb 3.2 oz 215 lb 9.8 oz  Weight (kg) 84 kg 82.192 kg 97.8 kg      Telemetry    Sinus in the  60s with rare PVCs- Personally Reviewed  ECG   05/08/2023 ECG (independently read by me): Normal sinus rhythm at 66.  T wave inversion in  leads I, aVL, V2 through V4.  05/06/2023 ECG (independently read by me): Normal sinus rhythm at 84 with QS complex V1 through V3 and mild ST abnormality inferiorly.  Physical Exam    BP (!) 124/51   Pulse (!) 59   Temp 99.2 F (37.3 C) (Oral)   Resp (!) 24   Ht 5\' 5"  (1.651 m)   Wt 84 kg   SpO2 95%   BMI 30.82 kg/m  General: Alert, oriented, no distress.  Skin: normal turgor, no rashes, warm and dry HEENT: Normocephalic, atraumatic. Pupils equal round and reactive to light; sclera anicteric; extraocular muscles intact; Nose without nasal septal hypertrophy Mouth/Parynx benign; Mallinpatti scale 4 Neck: No JVD, no carotid bruits; normal carotid upstroke Lungs: decreased BS without wheezing today Chest wall: Previous chest wall tenderness resolved Heart: PMI not displaced, RRR, s1 s2 normal, 1/6 systolic murmur, no diastolic murmur, no rubs, gallops, thrills, or heaves Abdomen: soft, nontender; no hepatosplenomehaly, BS+; abdominal aorta nontender and not dilated by palpation. Back: no CVA tenderness Pulses 2+ Musculoskeletal: full range of motion, normal strength, no joint deformities Extremities: no clubbing  cyanosis or edema, Homan's sign negative  Neurologic: grossly nonfocal; Cranial nerves grossly wnl Psychologic: Normal mood and affect      Labs    High Sensitivity Troponin:   Recent Labs  Lab 05/03/23 2224 05/04/23 0017  TROPONINIHS 4,223* 3,862*     Chemistry Recent Labs  Lab 05/04/23 0732 05/05/23 0452 05/07/23 0317 05/08/23 0313 05/09/23 0259  NA 137   < > 140 135 136  K 4.6   < > 4.0 3.3* 3.8  CL 107   < > 108 103 102  CO2 22   < > 22 24 24   GLUCOSE 128*   < > 109* 145* 150*  BUN 46*   < > 29* 32* 43*  CREATININE 2.65*   < > 1.98* 1.92* 1.98*  CALCIUM 8.8*   < > 8.8* 8.6* 8.7*  PROT 5.6*  --   --   --   --   ALBUMIN 3.6  --   --   --   --   AST 33  --   --   --   --   ALT 15  --   --   --   --   ALKPHOS 70  --   --   --   --   BILITOT 0.8   --   --   --   --   GFRNONAA 18*   < > 25* 26* 25*  ANIONGAP 8   < > 10 8 10    < > = values in this interval not displayed.    Lipids  Recent Labs  Lab 05/08/23 0313  CHOL 126  TRIG 135  HDL 33*  LDLCALC 66  CHOLHDL 3.8    Hematology Recent Labs  Lab 05/07/23 0317 05/08/23 0313 05/09/23 0259  WBC 10.0 10.3 9.2  RBC 3.38* 3.44* 3.42*  HGB 9.2* 9.5* 9.2*  HCT 29.1* 29.4* 28.9*  MCV 86.1 85.5 84.5  MCH 27.2 27.6 26.9  MCHC 31.6 32.3 31.8  RDW 14.7 14.6 14.6  PLT 187 182 199   Thyroid No results for input(s): "TSH", "FREET4" in the last 168 hours.  BNP Recent Labs  Lab 05/04/23 0732 05/06/23 0639 05/09/23 0259  BNP 996.7* 1,419.4* 836.4*    DDimer No results for input(s): "DDIMER" in the last 168 hours.   Radiology    DG Chest Port 1V same Day  Result Date: 05/07/2023 CLINICAL DATA:  Chest pain EXAM: PORTABLE CHEST 1 VIEW COMPARISON:  Chest x-ray 04/28/2023 FINDINGS: Strandy opacities in the left lung base are favored is atelectasis. There is no focal lung consolidation, pleural effusion or pneumothorax. The cardiomediastinal silhouette is within normal limits. No acute fractures are seen. IMPRESSION: Strandy opacities in the left lung base are favored is atelectasis. Electronically Signed   By: Darliss Cheney M.D.   On: 05/07/2023 17:09    Cardiac Studies   CATH: 08/16/2017 ([facility]): - Ost LAD to Prox LAD lesion is 50% stenosed.   Ost Cx to Prox Cx lesion is 30% stenosed.   Mid RCA lesion is 40% stenosed.     ECHO FROM [facility] 05/02/2023 - LV: LVEF 45-50% with hypokinesis of apical/ septal and apical inferior segments as well as apex. Diastolic function has an impaired relaxation pattern. - RV: Normal size and function. - LA: Mildly dilated. - RA: Normal size. - Mital Valve: Mild regurgitation. No stenosis.  - Aortic Valve: Mild sclerosis but no stenosis. No regurgitation. - Pulmonic Valve: No stenosis or  regurgitation. - Tricuspid  Valve: Trivial regurgitation. RVSP mildly elevated at 39 mmHg. - Pericardium: No effusion. - Aorta: Aortic root and ascending/ descending aorta were poorly visualized.    Patient Profile     77 y.o. female  with history of mild non-obstructive CAD on ca cardiac catheterization in 08/2017, hypertension, hyperlipidemia, type 2 diabetes mellitus, CKD stage III with solitary kidney, GERD, hiatal hernia, chronic anemia, right breast cancer s/p mastectomy in 2020, and obesity who presented to [facility] on 05/01/2023 from the Cardiology office for further evaluation of chest pain and dyspnea on exertion. Troponin I remained negative at [facility] and she underwent a Myoview which showed reversible ischemia in LAD distribution. She was transferred to [facility] for further evaluation with cardiac catheterization. High-sensitivity upon arrival to [facility] was elevated and she had now ruled in for a NSTEMI.   Assessment & Plan    NSTEMI: Troponins have increased to 4223.  Patient has experienced chest pain and shortness of breath with exertion for the last 2 to 3 months.  She is on IV heparin and is now back on IV nitroglycerin now on 20 mcg.  She is not having any recurrent chest pain.  She continues to be on aspirin, metoprolol 50 mg twice a day, and amlodipine which was increased to 10 mg . Plan for R and L heart cardiac catheterization today, but Cr did not improve. 1.98 today.  It has been recommended by pulmonary for patient to be on BiPAP while flat in cath lab to reduce potential for recurrent episode of respiratory distress.  With elevated creatinine and need for limited contrast, if need for intervention this will most likely need to be staged for another day in light of renal function.   Hypertension: Pressure had increased to the upper 140s, have increased NTG drip to 20 mcg.  She is also on hydralazine 50 mg every 8 hours and increased amlodipine dose of 10 mg in addition to her  previous metoprolol succinate 50 mg twice a day regimen.   CHF: Over the last several days patient received furosemide 80 and 60 mg 3 days ago, then  40 mg IV x 2, and yesterday 40 mg x 1.  BNP had increased to 1419.  Previous EF was 45 to 50% on echo at [facility] with hypokinesis of the apical/septal and apical inferior segments as well as apex.  She also had impaired diastolic relaxation.  BNP today remains elevated but improved from 1419 > 836.   AKI with baseline creatinine around 2.1.  Creatinine had increased to 2.65 on 10/26 and had improved down to 1.71.  Creatinine remains in the 1.9 range, 1.98 today. Respiratory distress:  Catheterization was canceled earlier in the week due to acute respiratory distress.  Appreciate pulmonary evaluation.  Patient now is on BiPAP while sleeping.  She is on Breo and as needed albuterol.  There is no wheezing today but she continues to have decreased breath sounds.   Hypokalemia: Potassium 3.3  improved to 3.8 today.  Hyperlipidemia: Had been on pravastatin, will change to atorvastatin 40 mg; DL this morning 66 Type 2 diabetes mellitus.  A1c 5.4.  At home patient was on glimepiride and pioglitazone, currently on hold     For questions or updates, please contact [facility] Please consult [email or web address] for contact info under       Signed, Nicki Guadalajara, MD  05/09/2023, 8:13 AM

## 2023-05-09 NOTE — Progress Notes (Addendum)
NAME:  Amber [name], MRN:  [id number], DOB:  [birth year], LOS: 6 ADMISSION DATE:  05/03/2023, CONSULTATION DATE:  10/29 REFERRING MD:  Dr. Jenene Slicker, CHIEF COMPLAINT:  chest pain   History of Present Illness:  Patient is a 77 yo F w/ pertinent PMH CAD, HTN, HLD, T2DM, CKD stage 3b, COPD presents to [facility] on 10/25 w/ chest pain.  Patient admitted to [facility] on 10/23 from cardiology office with chest pain and dyspnea on exertion. Troponin 1 negative x5. Echo showing lvef 45-50% with hypokinesis of apical/septal and apical inferior segments as well as apex. Myoview showed reversible ischemia in LAD and was transferred to [facility] on 10/25 for cardiology evaluation w/ heart cath. Trop now elevated at 4,223 then 3,862. Started on heparin, asa for NSTEMI. On nitroglycerin drip for HTN urgency.   On 10/28 heart cath canceled due to respiratory failure and inability to lay flat. Patient started on bipap and given 80 IV lasix w/ 850 cc UOP. CXR w/ pulmonary edema. 10/29 pccm consulted.  Pertinent  Medical History   Past Medical History:  Diagnosis Date   Chronic kidney disease    Diabetes (HCC)    Hypertension     Significant Hospital Events: Including procedures, antibiotic start and stop dates in addition to other pertinent events   10/25 admitted w/ NSTEMI 10/28 failed to get heart cath due to dyspnea 10/29 pccm consulted 10/30 no acute events overnight, oxygen weaned to 2 L nasal cannula  Interim History / Subjective:  Seen sitting up in bedside recliner with no acute complaints.  Is hopeful to complete heart catheterization today  Objective   Blood pressure (!) 148/65, pulse (!) 59, temperature 99.2 F (37.3 C), temperature source Oral, resp. rate (!) 25, height 5\' 5"  (1.651 m), weight 84 kg, SpO2 95%.        Intake/Output Summary (Last 24 hours) at 05/09/2023 0855 Last data filed at 05/09/2023 0820 Gross per 24 hour  Intake 1126.91 ml  Output 1450 ml  Net -323.09  ml   Filed Weights   05/07/23 0540 05/08/23 0500 05/09/23 0500  Weight: 97.8 kg 82.2 kg 84 kg    Examination: General: Acute on chronic ill-appearing elderly female sitting up in bedside recliner no acute distress HEENT: [place]/AT, MM pink/moist, PERRL,  Neuro: Alert and oriented x 3, nonfocal CV: s1s2 regular rate and rhythm, no murmur, rubs, or gallops,  PULM: Clear to auscultation bilaterally, no increased work of breathing, no added breath sounds GI: soft, bowel sounds active in all 4 quadrants, non-tender, non-distended Extremities: warm/dry, no edema  Skin: no rashes or lesions   Resolved Hospital Problem list     Assessment & Plan:  Acute respiratory failure w/ hypoxia Pulmonary edema from CHF Hx of COPD -on breo ellipta; not seen by pulmonary outpt Previous hx of tobacco abuse -smoked for 40 years pack a day; quit 10 years ago P: Continue oxygen therapy, wean as able BiPAP as needed Diurese as below Continue scheduled and as needed bronchodilators Pulmonary hygiene Mobilize as able  NSTEMI -High-sensitivity troponin peaked at 4223 CAD HFmrEF -Echo showing lvef 45-50% with hypokinesis of apical/septal and apical inferior segments as well as apex. Myoview showed reversible ischemia in LAD  HTN urgency HLD P: Cardiology following, appreciate assistance Cardiac catheterization scheduled today Continuous telemetry Continue IV heparin and aspirin per cardiology Stop nitroglycerin drip post cath Increase oral hydralazine Strict intake and output Daily weight GDMT as able  AKI on CKD 3b -Creat today 1.98 from  1.82 yesterday (baseline aroudn 1.8-2.2) P: Follow renal function Monitor urine output Trend BMP Avoid nephrotoxins Ensure adequate renal perfusion  T2DM P: Continue SSI CBG goal 1 40-1 80 CBG checks q4  Chronic anemia P: Trend CBC  Transfuse per protocol  Hgb goal greater greater than 7  GERD Hiatal hernia P: Continue  PPI  Hypokalemia P: Supplement as needed  Trend BMP   Best Practice (right click and "Reselect all SmartList Selections" daily)   Diet/type: Regular consistency (see orders) DVT prophylaxis: systemic heparin GI prophylaxis: PPI Lines: N/A Foley:  N/A Code Status:  full code Last date of multidisciplinary goals of care discussion: Continue current aggressive interventions, update patient and family daily  Critical care time:   CRITICAL CARE Performed by: Whitney D. Harris   Total critical care time: 37 minutes  Critical care time was exclusive of separately billable procedures and treating other patients.  Critical care was necessary to treat or prevent imminent or life-threatening deterioration.  Critical care was time spent personally by me on the following activities: development of treatment plan with patient and/or surrogate as well as nursing, discussions with consultants, evaluation of patient's response to treatment, examination of patient, obtaining history from patient or surrogate, ordering and performing treatments and interventions, ordering and review of laboratory studies, ordering and review of radiographic studies, pulse oximetry and re-evaluation of patient's condition.  Whitney D. Harris, NP-C [facility] Personal contact information can be found on Amion  If no contact or response made please call [phone] 05/09/2023, 8:55 AM    Critical care attending attestation note: I agree with the Advanced Practitioner's note, impression, and recommendations as outlined. I have taken an independent interval history, reviewed the chart and examined the patient. The following reflects my medical decision making and independent critical care time   Synopsis of assessment and plan: 77 year old female here with acute NSTEMI, complicated acute respiratory failure with hypoxia in the setting of pulmonary edema  Remain on 2 L nasal cannula oxygen, no  overnight issues   Physical exam: General: Elderly obese female, sitting on recliner HEENT: [place]/AT, eyes anicteric.  moist mucus membranes Neuro: Alert, awake following commands Chest: Coarse breath sounds, no wheezes or rhonchi Heart: Regular rate and rhythm, no murmurs or gallops Abdomen: Soft, nontender, nondistended, bowel sounds present Skin: No rash   Labs and images were reviewed  Assessment and plan: Acute respiratory failure with hypoxia Acute HFrEF with pulmonary edema Acute NSTEMI Hypertensive emergency, resolved AKI on CKD stage IIIb Diabetes type 2 Anemia of chronic disease Obesity  Patient is on 2 L nasal cannula oxygen, titrate with O2 sat goal 92% Cardiology is following diuretic management defer to them Continue aspirin and statin Continue IV heparin infusion Patient is going for cardiac cath today Blood pressure is better controlled Serum creatinine remains elevated Continue insulin with CBG goal 140-180 Monitor H&H     Cheri Fowler, MD [facility] See Amion for pager If no response to pager, please call [phone] until 7pm After 7pm, Please call E-link [phone]   05/09/2023, 12:43 PM  '

## 2023-05-09 NOTE — Progress Notes (Addendum)
PHARMACY - ANTICOAGULATION CONSULT NOTE  Pharmacy Consult for heparin Indication: chest pain/ACS  Allergies  Allergen Reactions   Advil [Ibuprofen] Other (See Comments)    Intolerance due to kidney problem   Aleve [Naproxen] Other (See Comments)    Intolerance due to kidney problem    Patient Measurements: Height: 5\' 5"  (165.1 cm) Weight: 84 kg (185 lb 3 oz) IBW/kg (Calculated) : 57 Heparin Dosing Weight: 76 kg  Vital Signs: Temp: 97.5 F (36.4 C) (10/31 1200) Temp Source: Oral (10/31 1200) BP: 137/64 (10/31 1423) Pulse Rate: 65 (10/31 1423)  Labs: Recent Labs    05/07/23 0317 05/08/23 0313 05/08/23 1832 05/09/23 0259  HGB 9.2* 9.5*  --  9.2*  HCT 29.1* 29.4*  --  28.9*  PLT 187 182  --  199  HEPARINUNFRC 0.26* <0.10* 0.44 0.39  CREATININE 1.98* 1.92*  --  1.98*    Estimated Creatinine Clearance: 24.7 mL/min (A) (by C-G formula based on SCr of 1.98 mg/dL (H)).   Assessment: 77 yo female on heparin for NSTEMI. Plans noted for L/R cath based on renal function  Heparin level therapeutic (0.39) on infusion at 1400 units/hr. CBC stable  10/31 PM: s/p cath, plan return for staged PCI of the proximal LAD. Pharmacy consulted to resume heparin 2hr after TR band removal (removed at 16:30).  Goal of Therapy:  Heparin level 0.3-0.7 units/ml Monitor platelets by anticoagulation protocol: Yes   Plan:  At 18:30, resume heparin at 1400 units/hr (no bolus) Check heparin level 8hr after resuming (at 02:30 on 11/1) Daily heparin level and CBC  Loralee Pacas, PharmD, BCPS Clinical Pharmacist  Please check AMION for all [facility] Pharmacy phone numbers After 10:00 PM, call Main Pharmacy [phone]

## 2023-05-09 NOTE — Progress Notes (Signed)
PT Cancellation Note  Patient Details Name: Amber [name] MRN: [id number] DOB: 01-05-1946   Cancelled Treatment:    Reason Eval/Treat Not Completed: Patient at procedure or test/unavailable.  Pt's in Cath lab and may not be off bed rest before end of day.  Will see as able and appropriate. 05/09/2023  Jacinto Halim., PT Acute Rehabilitation Services [phone]  (office)   [name] 05/09/2023, 1:12 PM

## 2023-05-09 NOTE — Progress Notes (Signed)
PT Cancellation Note  Patient Details Name: Amber [name] MRN: [id number] DOB: Aug 18, 1945   Cancelled Treatment:    Reason Eval/Treat Not Completed: Patient not medically ready;Medical issues which prohibited therapy.  Hold per NP, pt having CP presently, no intervention (stenting) completed in CATH.  Will see as appropriate 11/1. 05/09/2023  Jacinto Halim., PT Acute Rehabilitation Services [phone]  (office)   Amber [name] Amber [name] 05/09/2023, 5:59 PM

## 2023-05-09 NOTE — Interval H&P Note (Signed)
History and Physical Interval Note:  05/09/2023 7:22 AM  Amber [name]  has presented today for surgery, with the diagnosis of nstemi.  The various methods of treatment have been discussed with the patient and family. After consideration of risks, benefits and other options for treatment, the patient has consented to  Procedure(s): RIGHT/LEFT HEART CATH AND CORONARY ANGIOGRAPHY (N/A) as a surgical intervention.  The patient's history has been reviewed, patient examined, no change in status, stable for surgery.  I have reviewed the patient's chart and labs.  Questions were answered to the patient's satisfaction.     Orbie Pyo

## 2023-05-09 NOTE — Progress Notes (Signed)
PHARMACY - ANTICOAGULATION CONSULT NOTE  Pharmacy Consult for heparin Indication: chest pain/ACS  Allergies  Allergen Reactions   Advil [Ibuprofen] Other (See Comments)    Intolerance due to kidney problem   Aleve [Naproxen] Other (See Comments)    Intolerance due to kidney problem    Patient Measurements: Height: 5\' 5"  (165.1 cm) Weight: 84 kg (185 lb 3 oz) IBW/kg (Calculated) : 57 Heparin Dosing Weight: 76 kg  Vital Signs: Temp: 99.2 F (37.3 C) (10/31 0346) Temp Source: Oral (10/31 0346) BP: 124/51 (10/31 0700) Pulse Rate: 59 (10/31 0700)  Labs: Recent Labs    05/07/23 0317 05/08/23 0313 05/08/23 1832 05/09/23 0259  HGB 9.2* 9.5*  --  9.2*  HCT 29.1* 29.4*  --  28.9*  PLT 187 182  --  199  HEPARINUNFRC 0.26* <0.10* 0.44 0.39  CREATININE 1.98* 1.92*  --  1.98*    Estimated Creatinine Clearance: 24.7 mL/min (A) (by C-G formula based on SCr of 1.98 mg/dL (H)).   Assessment: 77 yo female on heparin for NSTEMI. Plans noted for L/R cath based on renal function  Heparin level therapeutic (0.39) on infusion at 1400 units/hr. CBC stable  Goal of Therapy:  Heparin level 0.3-0.7 units/ml Monitor platelets by anticoagulation protocol: Yes   Plan:  Continue heparin at 1600 units/hr Daily heparin level and CBC  Harland German, PharmD Clinical Pharmacist **Pharmacist phone directory can now be found on [email or web address] ([email or web address]).  Listed under [facility].

## 2023-05-09 NOTE — Progress Notes (Signed)
Afternoon rounds   Patient stable this afternoon postcardiac catheterization.  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  Patient continues to have intermittent chest pain improved with nitroglycerin.  Continue heparin drip  Deeana Atwater D. Harris, NP-C [facility] Personal contact information can be found on Amion  If no contact or response made please call [phone] 05/09/2023, 5:12 PM

## 2023-05-10 ENCOUNTER — Encounter: Payer: Self-pay | Admitting: Internal Medicine

## 2023-05-10 ENCOUNTER — Inpatient Hospital Stay (HOSPITAL_COMMUNITY)
Admission: RE | Disposition: A | Discharge status: Home / Self Care | Payer: Self-pay | Source: Other Acute Inpatient Hospital | Attending: Internal Medicine

## 2023-05-10 DIAGNOSIS — E782 Mixed hyperlipidemia: Secondary | ICD-10-CM | POA: Diagnosis not present

## 2023-05-10 DIAGNOSIS — J449 Chronic obstructive pulmonary disease, unspecified: Secondary | ICD-10-CM | POA: Diagnosis not present

## 2023-05-10 DIAGNOSIS — I25110 Atherosclerotic heart disease of native coronary artery with unstable angina pectoris: Secondary | ICD-10-CM | POA: Diagnosis not present

## 2023-05-10 DIAGNOSIS — E1121 Type 2 diabetes mellitus with diabetic nephropathy: Secondary | ICD-10-CM | POA: Diagnosis not present

## 2023-05-10 DIAGNOSIS — N184 Chronic kidney disease, stage 4 (severe): Secondary | ICD-10-CM | POA: Diagnosis not present

## 2023-05-10 DIAGNOSIS — J9601 Acute respiratory failure with hypoxia: Secondary | ICD-10-CM | POA: Diagnosis not present

## 2023-05-10 DIAGNOSIS — I214 Non-ST elevation (NSTEMI) myocardial infarction: Secondary | ICD-10-CM | POA: Diagnosis not present

## 2023-05-10 DIAGNOSIS — E669 Obesity, unspecified: Secondary | ICD-10-CM | POA: Diagnosis not present

## 2023-05-10 HISTORY — PX: CORONARY STENT INTERVENTION: CATH118234

## 2023-05-10 LAB — COMPREHENSIVE METABOLIC PANEL
ALT: 17 U/L (ref 0–44)
AST: 14 U/L — ABNORMAL LOW (ref 15–41)
Albumin: 3 g/dL — ABNORMAL LOW (ref 3.5–5.0)
Alkaline Phosphatase: 58 U/L (ref 38–126)
Anion gap: 9 (ref 5–15)
BUN: 29 mg/dL — ABNORMAL HIGH (ref 8–23)
CO2: 23 mmol/L (ref 22–32)
Calcium: 8.5 mg/dL — ABNORMAL LOW (ref 8.9–10.3)
Chloride: 107 mmol/L (ref 98–111)
Creatinine, Ser: 1.73 mg/dL — ABNORMAL HIGH (ref 0.44–1.00)
GFR, Estimated: 30 mL/min — ABNORMAL LOW (ref >60)
Glucose, Bld: 128 mg/dL — ABNORMAL HIGH (ref 70–99)
Potassium: 3.9 mmol/L (ref 3.5–5.1)
Sodium: 139 mmol/L (ref 135–145)
Total Bilirubin: 0.6 mg/dL (ref 0.3–1.2)
Total Protein: 4.9 g/dL — ABNORMAL LOW (ref 6.5–8.1)

## 2023-05-10 LAB — CBC
HCT: 27.8 % — ABNORMAL LOW (ref 36.0–46.0)
HCT: 27 % — ABNORMAL LOW (ref 36.0–46.0)
Hemoglobin: 8.7 g/dL — ABNORMAL LOW (ref 12.0–15.0)
Hemoglobin: 8.6 g/dL — ABNORMAL LOW (ref 12.0–15.0)
MCH: 26.9 pg (ref 26.0–34.0)
MCH: 26.5 pg (ref 26.0–34.0)
MCHC: 31.3 g/dL (ref 30.0–36.0)
MCHC: 31.9 g/dL (ref 30.0–36.0)
MCV: 86.1 fL (ref 80.0–100.0)
MCV: 83.3 fL (ref 80.0–100.0)
Platelets: 207 10*3/uL (ref 150–400)
Platelets: 227 10*3/uL (ref 150–400)
RBC: 3.23 MIL/uL — ABNORMAL LOW (ref 3.87–5.11)
RBC: 3.24 MIL/uL — ABNORMAL LOW (ref 3.87–5.11)
RDW: 14.5 % (ref 11.5–15.5)
RDW: 14.4 % (ref 11.5–15.5)
WBC: 8.9 10*3/uL (ref 4.0–10.5)
WBC: 9.8 10*3/uL (ref 4.0–10.5)
nRBC: 0 % (ref 0.0–0.2)
nRBC: 0 % (ref 0.0–0.2)

## 2023-05-10 LAB — GLUCOSE, CAPILLARY
Glucose-Capillary: 146 mg/dL — ABNORMAL HIGH (ref 70–99)
Glucose-Capillary: 137 mg/dL — ABNORMAL HIGH (ref 70–99)
Glucose-Capillary: 135 mg/dL — ABNORMAL HIGH (ref 70–99)
Glucose-Capillary: 148 mg/dL — ABNORMAL HIGH (ref 70–99)

## 2023-05-10 LAB — CREATININE, SERUM
Creatinine, Ser: 1.58 mg/dL — ABNORMAL HIGH (ref 0.44–1.00)
GFR, Estimated: 33 mL/min — ABNORMAL LOW (ref >60)

## 2023-05-10 LAB — POCT ACTIVATED CLOTTING TIME
Activated Clotting Time: 239 s
Activated Clotting Time: 320 s
Activated Clotting Time: 193 s
Activated Clotting Time: 176 s
Activated Clotting Time: 152 s

## 2023-05-10 LAB — HEPARIN LEVEL (UNFRACTIONATED): Heparin Unfractionated: <0.1 [IU]/mL — ABNORMAL LOW (ref 0.30–0.70)

## 2023-05-10 SURGERY — CORONARY STENT INTERVENTION
Anesthesia: LOCAL

## 2023-05-10 MED ORDER — TICAGRELOR 90 MG PO TABS
180.0000 mg | ORAL_TABLET | Freq: Once | ORAL | Status: AC
  Administered 2023-05-10: 180 mg via ORAL
  Filled 2023-05-10: qty 2

## 2023-05-10 MED ORDER — SODIUM CHLORIDE 0.9 % IV SOLN
250.0000 mL | INTRAVENOUS | Status: AC | PRN
Start: 2023-05-10 — End: 2023-05-11

## 2023-05-10 MED ORDER — TICAGRELOR 90 MG PO TABS
90.0000 mg | ORAL_TABLET | Freq: Two times a day (BID) | ORAL | Status: DC
  Administered 2023-05-10 – 2023-05-12 (×4): 90 mg via ORAL
  Filled 2023-05-10 (×4): qty 1

## 2023-05-10 MED ORDER — HEPARIN (PORCINE) IN NACL 1000-0.9 UT/500ML-% IV SOLN
INTRAVENOUS | Status: DC | PRN
  Administered 2023-05-10 (×2): 500 mL

## 2023-05-10 MED ORDER — HEPARIN SODIUM (PORCINE) 1000 UNIT/ML IJ SOLN
INTRAMUSCULAR | Status: AC
  Filled 2023-05-10: qty 10

## 2023-05-10 MED ORDER — NITROGLYCERIN 0.4 MG SL SUBL
SUBLINGUAL_TABLET | SUBLINGUAL | Status: DC | PRN
  Administered 2023-05-10: .4 mg via SUBLINGUAL

## 2023-05-10 MED ORDER — SODIUM CHLORIDE 0.9% FLUSH
3.0000 mL | Freq: Two times a day (BID) | INTRAVENOUS | Status: DC
  Administered 2023-05-11 – 2023-05-12 (×3): 3 mL via INTRAVENOUS

## 2023-05-10 MED ORDER — FENTANYL CITRATE (PF) 100 MCG/2ML IJ SOLN
INTRAMUSCULAR | Status: AC
  Filled 2023-05-10: qty 2

## 2023-05-10 MED ORDER — LABETALOL HCL 5 MG/ML IV SOLN: 10.0000 mg | INTRAVENOUS | Status: AC | PRN

## 2023-05-10 MED ORDER — VERAPAMIL HCL 2.5 MG/ML IV SOLN
INTRAVENOUS | Status: AC
  Filled 2023-05-10: qty 2

## 2023-05-10 MED ORDER — MORPHINE SULFATE (PF) 2 MG/ML IV SOLN
INTRAVENOUS | Status: AC
  Filled 2023-05-10: qty 1

## 2023-05-10 MED ORDER — LABETALOL HCL 5 MG/ML IV SOLN
INTRAVENOUS | Status: DC | PRN
  Administered 2023-05-10: 10 mg via INTRAVENOUS

## 2023-05-10 MED ORDER — LABETALOL HCL 5 MG/ML IV SOLN
INTRAVENOUS | Status: AC
  Filled 2023-05-10: qty 4

## 2023-05-10 MED ORDER — MORPHINE SULFATE (PF) 2 MG/ML IV SOLN: 2.0000 mg | INTRAVENOUS | Status: AC | PRN

## 2023-05-10 MED ORDER — MIDAZOLAM HCL 2 MG/2ML IJ SOLN
INTRAMUSCULAR | Status: AC
Start: 2023-05-10 — End: ?
  Filled 2023-05-10: qty 2

## 2023-05-10 MED ORDER — NITROGLYCERIN 0.4 MG SL SUBL
SUBLINGUAL_TABLET | SUBLINGUAL | Status: AC
  Filled 2023-05-10: qty 1

## 2023-05-10 MED ORDER — SODIUM CHLORIDE 0.9 % IV SOLN: INTRAVENOUS | Status: DC

## 2023-05-10 MED ORDER — MIDAZOLAM HCL 2 MG/2ML IJ SOLN
INTRAMUSCULAR | Status: DC | PRN
  Administered 2023-05-10: 1 mg via INTRAVENOUS

## 2023-05-10 MED ORDER — SODIUM CHLORIDE 0.9 % IV SOLN
INTRAVENOUS | Status: AC
Start: 2023-05-10 — End: 2023-05-10

## 2023-05-10 MED ORDER — FENTANYL CITRATE (PF) 100 MCG/2ML IJ SOLN
INTRAMUSCULAR | Status: DC | PRN
  Administered 2023-05-10 (×2): 25 ug via INTRAVENOUS

## 2023-05-10 MED ORDER — SODIUM CHLORIDE 0.9% FLUSH
3.0000 mL | INTRAVENOUS | Status: DC | PRN
Start: 2023-05-10 — End: 2023-05-12

## 2023-05-10 MED ORDER — NITROGLYCERIN 1 MG/10 ML FOR IR/CATH LAB
INTRA_ARTERIAL | Status: AC
  Filled 2023-05-10: qty 10

## 2023-05-10 MED ORDER — HYDRALAZINE HCL 20 MG/ML IJ SOLN
INTRAMUSCULAR | Status: DC | PRN
  Administered 2023-05-10: 10 mg via INTRAVENOUS

## 2023-05-10 MED ORDER — LIDOCAINE HCL (PF) 1 % IJ SOLN
INTRAMUSCULAR | Status: AC
  Filled 2023-05-10: qty 30

## 2023-05-10 MED ORDER — HEPARIN SODIUM (PORCINE) 5000 UNIT/ML IJ SOLN
5000.0000 [IU] | Freq: Three times a day (TID) | INTRAMUSCULAR | Status: DC
  Administered 2023-05-10 – 2023-05-12 (×5): 5000 [IU] via SUBCUTANEOUS
  Filled 2023-05-10 (×6): qty 1

## 2023-05-10 MED ORDER — MORPHINE SULFATE (PF) 2 MG/ML IV SOLN
INTRAVENOUS | Status: DC | PRN
  Administered 2023-05-10: 2 mg via INTRAVENOUS

## 2023-05-10 MED ORDER — NITROGLYCERIN 1 MG/10 ML FOR IR/CATH LAB
INTRA_ARTERIAL | Status: DC | PRN
  Administered 2023-05-10: 200 ug via INTRACORONARY
  Administered 2023-05-10: 200 ug via INTRA_ARTERIAL
  Administered 2023-05-10 (×2): 200 ug via INTRACORONARY

## 2023-05-10 MED ORDER — LIDOCAINE HCL (PF) 1 % IJ SOLN
INTRAMUSCULAR | Status: DC | PRN
  Administered 2023-05-10: 5 mL via INTRADERMAL

## 2023-05-10 MED ORDER — HEPARIN SODIUM (PORCINE) 1000 UNIT/ML IJ SOLN
INTRAMUSCULAR | Status: DC | PRN
  Administered 2023-05-10: 2000 [IU] via INTRAVENOUS
  Administered 2023-05-10: 9000 [IU] via INTRAVENOUS

## 2023-05-10 MED ORDER — HYDRALAZINE HCL 20 MG/ML IJ SOLN: 10.0000 mg | INTRAMUSCULAR | Status: AC | PRN

## 2023-05-10 MED ORDER — IOHEXOL 350 MG/ML SOLN
INTRAVENOUS | Status: DC | PRN
  Administered 2023-05-10: 60 mL

## 2023-05-10 MED ORDER — HYDRALAZINE HCL 20 MG/ML IJ SOLN
INTRAMUSCULAR | Status: AC
  Filled 2023-05-10: qty 1

## 2023-05-10 SURGICAL SUPPLY — 17 items
BALL SAPPHIRE NC24 2.75X12 (BALLOONS) ×1
BALLN EMERGE MR 2.5X12 (BALLOONS) ×1
BALLOON EMERGE MR 2.5X12 (BALLOONS) IMPLANT
BALLOON SAPPHIRE NC24 2.75X12 (BALLOONS) IMPLANT
CATH VISTA GUIDE 6FR XBLAD3.5 (CATHETERS) IMPLANT
ELECT DEFIB PAD ADLT CADENCE (PAD) IMPLANT
KIT ENCORE 26 ADVANTAGE (KITS) IMPLANT
KIT MICROPUNCTURE NIT STIFF (SHEATH) IMPLANT
PACK CARDIAC CATHETERIZATION (CUSTOM PROCEDURE TRAY) ×1 IMPLANT
SET ATX-X65L (MISCELLANEOUS) IMPLANT
SHEATH PINNACLE 6F 10CM (SHEATH) IMPLANT
SHEATH PROBE COVER 6X72 (BAG) IMPLANT
STENT SYNERGY XD 2.50X16 (Permanent Stent) IMPLANT
SYNERGY XD 2.50X16 (Permanent Stent) ×1 IMPLANT
TUBING CIL FLEX 10 FLL-RA (TUBING) IMPLANT
WIRE ASAHI PROWATER 180CM (WIRE) IMPLANT
WIRE EMERALD 3MM-J .035X150CM (WIRE) IMPLANT

## 2023-05-10 NOTE — Progress Notes (Signed)
PT Cancellation Note  Patient Details Name: Amber [name] MRN: [id number] DOB: [birth year]   Cancelled Treatment:    Reason Eval/Treat Not Completed: Medical issues which prohibited therapy; on bedrest post sheath removal.  Will follow up another day.    Elray Mcgregor 05/10/2023, 4:37 PM Sheran Lawless, PT Acute Rehabilitation Services Office:[phone] 05/10/2023

## 2023-05-10 NOTE — Progress Notes (Signed)
Back from cardiac cath.  Now s/p DES from ost LAD to prox LAD w/ vessel stenosis changed from 80% to 0  No distress  Still on [place] oxygen.  Denies CP  Plan Wean o2 IV hydration post cath given CKD Additional rx of GDMT per cards re: HFmrEF Cont dual antiplatelet w/ asa and 90mg  Ticagelor BID x 12 months for ostial LAD stent then reduce to 60mg  bid and dc asa   Critical care will s/o but as always available as needed.   Simonne Martinet ACNP-BC [facility] Pulmonary/Critical Care Pager # [phone] OR # [phone] if no answer

## 2023-05-10 NOTE — Plan of Care (Signed)

## 2023-05-10 NOTE — Care Management (Signed)
Transition of Care [facility]) - Inpatient Brief Assessment   Patient Details  Name: Amber [name] MRN: [id number] Date of Birth: 1946/06/05  [facility] of [facility] CM/SW Contact:    Lockie Pares, RN Phone Number: 05/10/2023, 10:50 AM   Clinical Narrative:  77 yo patient presented with [facility] chest pain from home. Lives with spouse. Transferred from [place]. She is on and off BiPap. Plan today for LHC and PCI, staged. Patient is deconditioned and may need HH upon DC TOC will follow for needs, recommendations, and transitions of care.  Transition of Care Asessment: Insurance and Status: Insurance coverage has been reviewed Patient has primary care physician: Yes Home environment has been reviewed: Home with Spouse Prior level of function:: Independent Prior/Current Home Services: No current home services Social Determinants of Health Reivew: SDOH reviewed no interventions necessary Readmission risk has been reviewed: Yes Transition of care needs: transition of care needs identified, TOC will continue to follow

## 2023-05-10 NOTE — Progress Notes (Signed)
Rounding Note    Patient Name: Amber [name] Date of Encounter: 05/10/2023  [facility] Cardiologist: Marlyn Corporal Madireddy, MD   Subjective   No chest pain or shortness of breath.  Inpatient Medications    Scheduled Meds:  amLODipine  10 mg Oral Daily   aspirin EC  81 mg Oral Daily   atorvastatin  40 mg Oral Daily   Chlorhexidine Gluconate Cloth  6 each Topical Daily   cholecalciferol  5,000 Units Oral Daily   fluticasone furoate-vilanterol  1 puff Inhalation Daily   hydrALAZINE  75 mg Oral Q8H   insulin aspart  0-15 Units Subcutaneous TID WC   metoprolol succinate  50 mg Oral BID   mouth rinse  15 mL Mouth Rinse 4 times per day   pantoprazole  40 mg Oral Daily   sodium chloride flush  3 mL Intravenous Q12H   Continuous Infusions:  sodium chloride     heparin 1,500 Units/hr (05/10/23 0551)   nitroGLYCERIN 30 mcg/min (05/10/23 0400)   PRN Meds: sodium chloride, acetaminophen, ipratropium-albuterol, nitroGLYCERIN, ondansetron (ZOFRAN) IV, mouth rinse, mouth rinse, sodium chloride flush   Vital Signs    Vitals:   05/10/23 0615 05/10/23 0630 05/10/23 0645 05/10/23 0700  BP: (!) 156/63 (!) 154/59 (!) 148/66 127/72  Pulse: 60 62 60 (!) 59  Resp: (!) 23 20 (!) 26 (!) 21  Temp:      TempSrc:      SpO2: 96% 96% 96% 96%  Weight:      Height:        Intake/Output Summary (Last 24 hours) at 05/10/2023 0809 Last data filed at 05/10/2023 0400 Gross per 24 hour  Intake 431.16 ml  Output 725 ml  Net -293.84 ml   I/O -448.1 since admission      05/10/2023    6:10 AM 05/09/2023    5:00 AM 05/08/2023    5:00 AM  Last 3 Weights  Weight (lbs) 187 lb 13.3 oz 185 lb 3 oz 181 lb 3.2 oz  Weight (kg) 85.2 kg 84 kg 82.192 kg      Telemetry    Sinus in the  60s with rare PVCs- Personally Reviewed  ECG   05/08/2023 ECG (independently read by me): Normal sinus rhythm at 66.  T wave inversion in leads I, aVL, V2 through V4.  05/06/2023 ECG  (independently read by me): Normal sinus rhythm at 84 with QS complex V1 through V3 and mild ST abnormality inferiorly.  Physical Exam   BP 127/72   Pulse (!) 59   Temp 98.8 F (37.1 C) (Oral)   Resp (!) 21   Ht 5\' 5"  (1.651 m)   Wt 85.2 kg   SpO2 96%   BMI 31.26 kg/m  General: Alert, oriented, no distress.  Skin: normal turgor, no rashes, warm and dry HEENT: Normocephalic, atraumatic. Pupils equal round and reactive to light; sclera anicteric; extraocular muscles intact;  Nose without nasal septal hypertrophy Mouth/Parynx benign; Mallinpatti scale 4 Neck: No JVD, no carotid bruits; normal carotid upstroke Lungs: decreased BS, improved; no wheezing or rales Chest wall: without tenderness to palpitation Heart: PMI not displaced, RRR, s1 s2 normal, 1/6 systolic murmur, no diastolic murmur, no rubs, gallops, thrills, or heaves Abdomen: soft, nontender; no hepatosplenomehaly, BS+; abdominal aorta nontender and not dilated by palpation. Back: no CVA tenderness Pulses 2+: L radial site stable Musculoskeletal: full range of motion, normal strength, no joint deformities Extremities: ecchymosis L arm from iv infusion several days  before cath; no clubbing cyanosis or edema, Homan's sign negative  Neurologic: grossly nonfocal; Cranial nerves grossly wnl Psychologic: Normal mood and affect      Labs    High Sensitivity Troponin:   Recent Labs  Lab 05/03/23 2224 05/04/23 0017  TROPONINIHS 4,223* 3,862*     Chemistry Recent Labs  Lab 05/04/23 0732 05/05/23 0452 05/08/23 0313 05/09/23 0259 05/09/23 1354 05/09/23 1416 05/09/23 1419 05/10/23 0314  NA 137   < > 135 136   < > 139 139 139  K 4.6   < > 3.3* 3.8   < > 3.8 3.7 3.9  CL 107   < > 103 102  --   --   --  107  CO2 22   < > 24 24  --   --   --  23  GLUCOSE 128*   < > 145* 150*  --   --   --  128*  BUN 46*   < > 32* 43*  --   --   --  29*  CREATININE 2.65*   < > 1.92* 1.98*  --   --   --  1.73*  CALCIUM 8.8*   < > 8.6*  8.7*  --   --   --  8.5*  PROT 5.6*  --   --   --   --   --   --  4.9*  ALBUMIN 3.6  --   --   --   --   --   --  3.0*  AST 33  --   --   --   --   --   --  14*  ALT 15  --   --   --   --   --   --  17  ALKPHOS 70  --   --   --   --   --   --  58  BILITOT 0.8  --   --   --   --   --   --  0.6  GFRNONAA 18*   < > 26* 25*  --   --   --  30*  ANIONGAP 8   < > 8 10  --   --   --  9   < > = values in this interval not displayed.    Lipids  Recent Labs  Lab 05/08/23 0313  CHOL 126  TRIG 135  HDL 33*  LDLCALC 66  CHOLHDL 3.8    Hematology Recent Labs  Lab 05/08/23 0313 05/09/23 0259 05/09/23 1354 05/09/23 1416 05/09/23 1419 05/10/23 0314  WBC 10.3 9.2  --   --   --  8.9  RBC 3.44* 3.42*  --   --   --  3.23*  HGB 9.5* 9.2*   < > 8.8* 8.8* 8.7*  HCT 29.4* 28.9*   < > 26.0* 26.0* 27.8*  MCV 85.5 84.5  --   --   --  86.1  MCH 27.6 26.9  --   --   --  26.9  MCHC 32.3 31.8  --   --   --  31.3  RDW 14.6 14.6  --   --   --  14.5  PLT 182 199  --   --   --  207   < > = values in this interval not displayed.   Thyroid No results for input(s): "TSH", "FREET4" in the last 168 hours.  BNP Recent Labs  Lab 05/04/23  0732 05/06/23 0639 05/09/23 0259  BNP 996.7* 1,419.4* 836.4*    DDimer No results for input(s): "DDIMER" in the last 168 hours.   Radiology    CARDIAC CATHETERIZATION  Result Date: 05/09/2023   Dist RCA lesion is 60% stenosed.   Prox LAD lesion is 80% stenosed.   Ost Cx lesion is 30% stenosed. High-grade proximal LAD lesion; the patient will receive return for staged PCI depending on creatinine. Moderate distal right coronary artery disease and mild ostial circumflex disease. Fick cardiac output of 5.2 L/min and Fick cardiac index of 2.7 L/min/m with the following hemodynamics: Right atrial pressure mean of 6 mmHg Right ventricular pressure 49/0 with an end-diastolic pressure of 13 mmHg Wedge pressure mean of 15 mmHg with V waves to 20 mmHg Pulmonary artery pressure  49/18 with a mean of 30 mmHg PVR of 2.87 Woods units Pulmonary artery pulsatility index of 5 4.  LVEDP of 34 mmHg. Recommendation: Staged PCI of proximal LAD lesion; resume heparin infusion 2 hours after TR band has been removed.    Cardiac Studies   CATH: 08/16/2017 ([facility]): - Ost LAD to Prox LAD lesion is 50% stenosed.   Ost Cx to Prox Cx lesion is 30% stenosed.   Mid RCA lesion is 40% stenosed.     ECHO FROM [facility] 05/02/2023 - LV: LVEF 45-50% with hypokinesis of apical/ septal and apical inferior segments as well as apex. Diastolic function has an impaired relaxation pattern. - RV: Normal size and function. - LA: Mildly dilated. - RA: Normal size. - Mital Valve: Mild regurgitation. No stenosis.  - Aortic Valve: Mild sclerosis but no stenosis. No regurgitation. - Pulmonic Valve: No stenosis or regurgitation. - Tricuspid Valve: Trivial regurgitation. RVSP mildly elevated at 39 mmHg. - Pericardium: No effusion. - Aorta: Aortic root and ascending/ descending aorta were poorly visualized.    CATH: 05/09/2023   Dist RCA lesion is 60% stenosed.   Prox LAD lesion is 80% stenosed.   Ost Cx lesion is 30% stenosed.   High-grade proximal LAD lesion; the patient will receive return for staged PCI depending on creatinine. Moderate distal right coronary artery disease and mild ostial circumflex disease. Fick cardiac output of 5.2 L/min and Fick cardiac index of 2.7 L/min/m with the following hemodynamics: Right atrial pressure mean of 6 mmHg Right ventricular pressure 49/0 with an end-diastolic pressure of 13 mmHg Wedge pressure mean of 15 mmHg with V waves to 20 mmHg Pulmonary artery pressure 49/18 with a mean of 30 mmHg PVR of 2.87 Woods units Pulmonary artery pulsatility index of 5 4.  LVEDP of 34 mmHg.   Recommendation: Staged PCI of proximal LAD lesion; resume heparin infusion 2 hours after TR band has been removed.      Patient Profile     77 y.o.  female  with history of mild non-obstructive CAD on cardiac catheterization in 08/2017, hypertension, hyperlipidemia, type 2 diabetes mellitus, CKD stage III with solitary kidney, GERD, hiatal hernia, chronic anemia, right breast cancer s/p mastectomy in 2020, and obesity who presented to [facility] on 05/01/2023 from the Cardiology office for further evaluation of chest pain and dyspnea on exertion. Troponin I remained negative at [facility] and she underwent a Myoview which showed reversible ischemia in LAD distribution. She was transferred to [facility] for further evaluation with cardiac catheterization. High-sensitivity upon arrival to [facility] was elevated and she had now ruled in for a NSTEMI.   Assessment & Plan  NSTEMI: Troponins have increased to 4223.  Patient has experienced chest pain and shortness of breath with exertion for the last 2 to 3 months.  She is on IV heparin and is now back on IV nitroglycerin now on 20 mcg.  She is not having any recurrent chest pain.  She continues to be on aspirin, metoprolol 50 mg twice a day, and amlodipine which was increased to 10 mg.  Catheterization data and images personally reviewed.  There is high-grade 80 to 90% very proximal LAD stenosis with moderate disease extending to the left main, as well as 30 to 40% ostial circumflex stenosis.  Patient has been hydrated post procedure.  Creatinine improved from 1.98-1.73 today.  Plan is for PCI to LAD ostial proximal region.  Patient is without chest pain.  She continues to be on IV nitroglycerin and heparin.  Patient aware of risk/benefits of the procedure. Hypertension: Pressure had increased to the upper 140s, have increased NTG drip to 20 mcg.  She is also on hydralazine 50 mg every 8 hours and increased amlodipine dose of 10 mg in addition to her previous metoprolol succinate 50 mg twice a day regimen.  BP today improved currently at 134/60 CHF: Over the last several days patient received  furosemide 80 and 60 mg 3 days ago, then  40 mg IV x 2, and yesterday 40 mg x 1.  BNP had increased to 1419.  Previous EF was 45 to 50% on echo at [facility] with hypokinesis of the apical/septal and apical inferior segments as well as apex.  She also had impaired diastolic relaxation.  BNP 10/31 remains elevated but improved from 1419 > 836.   AKI with baseline creatinine around 2.1.  Creatinine had increased to 2.65 on 10/26 and had improved down to 1.71 fluids but subsequently she developed recent respiratory distress requiring diuresis.  Creatinine subsequently had increased to 1.98.  Currently, creatinine today is 1.74.  No rales on exam Respiratory distress:  Catheterization was canceled earlier in the week due to acute respiratory distress.  Appreciate pulmonary evaluation.  Patient now is on BiPAP while sleeping.  She is on Breo and as needed albuterol.  There is no wheezing today but she continues to have decreased breath sounds.  Tolerated catheterization yesterday and this was performed without her on BiPAP treatment. Hypokalemia: Potassium 3.3  improved to 3.8 today and 3.9 today. Hyperlipidemia: Had been on pravastatin, will change to atorvastatin 40 mg; DL this morning 66 Type 2 diabetes mellitus.  A1c 5.4.  At home patient was on glimepiride and pioglitazone, currently on hold     Plan for staged PCI to LAD today  For questions or updates, please contact [facility] Please consult [email or web address] for contact info under       Signed, Nicki Guadalajara, MD  05/10/2023, 8:09 AM

## 2023-05-10 NOTE — Progress Notes (Signed)
NAME:  Amber [name], MRN:  [id number], DOB:  21-Aug-1945, LOS: 7 ADMISSION DATE:  05/03/2023, CONSULTATION DATE:  10/29 REFERRING MD:  Dr. Jenene Slicker, CHIEF COMPLAINT:  chest pain   History of Present Illness:  Patient is a 77 yo F w/ pertinent PMH CAD, HTN, HLD, T2DM, CKD stage 3b, COPD presents to [facility] on 10/25 w/ chest pain.  Patient admitted to [facility] on 10/23 from cardiology office with chest pain and dyspnea on exertion. Troponin 1 negative x5. Echo showing lvef 45-50% with hypokinesis of apical/septal and apical inferior segments as well as apex. Myoview showed reversible ischemia in LAD and was transferred to [facility] on 10/25 for cardiology evaluation w/ heart cath. Trop now elevated at 4,223 then 3,862. Started on heparin, asa for NSTEMI. On nitroglycerin drip for HTN urgency.   On 10/28 heart cath canceled due to respiratory failure and inability to lay flat. Patient started on bipap and given 80 IV lasix w/ 850 cc UOP. CXR w/ pulmonary edema. 10/29 pccm consulted.  Pertinent  Medical History   Past Medical History:  Diagnosis Date   Chronic kidney disease    Diabetes (HCC)    Hypertension     Significant Hospital Events: Including procedures, antibiotic start and stop dates in addition to other pertinent events   10/25 admitted w/ NSTEMI 10/28 failed to get heart cath due to dyspnea 10/29 pccm consulted 10/30 no acute events overnight, oxygen weaned to 2 L nasal cannula 10/31 left and right heart cath. 60% dist RCA, 80% prox LAD, 30% Ost Cx. Heparin resumed. CI 2.7 RAP , wedge 15 plan to proceed w/ staged PCI intervention of high grade prox LAD. Still getting NTG for chest pain  11/1 currently on 2 LPM via [place].   Interim History / Subjective:  Denies chest pain or shortness of breath  Objective   Blood pressure 127/72, pulse (!) 59, temperature 98.8 F (37.1 C), temperature source Oral, resp. rate (!) 21, height 5\' 5"  (1.651 m), weight 85.2 kg, SpO2  96%.        Intake/Output Summary (Last 24 hours) at 05/10/2023 0750 Last data filed at 05/10/2023 0400 Gross per 24 hour  Intake 498.4 ml  Output 725 ml  Net -226.6 ml   Filed Weights   05/08/23 0500 05/09/23 0500 05/10/23 0610  Weight: 82.2 kg 84 kg 85.2 kg    Examination: General Pleasant 77 year old white female sitting up in chair no acute distress this morning HEENT normocephalic atraumatic no jugular venous distention is appreciated Pulmonary: Clear diminished bases no wheezing or rhonchi Last film 29th vasc congestion  Cardiac regular rate and rhythm Abdomen soft nontender Extremities are warm with brisk capillary refill no significant edema Neuro intact  Resolved Hospital Problem list     Assessment & Plan:  Acute respiratory failure w/ hypoxia due to Pulmonary edema from CHF complicated by Hx of COPD &  tobacco abuse -smoked for 40 years pack a day; quit 10 years ago -on breo ellipta; not seen by pulmonary outpt Plan Cont to wean FIO2 for sats > 92% Cont pulse ox  Daily assessment for diuresis  Cont scheduled Breo ellipta and PRN SABA BiPAP as needed Pulmonary hygiene Mobilize as able  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 Myoview showed reversible ischemia in LAD  Plan Cont tele  Cont Heparin and asa NTG gtt for chest pain  Scheduled metoprolol,  Norvasc and hydralazine  Cont statin  Daily weight  Will go back to cath lab for staged PCI as directed by cards team   AKI on CKD 3b -Creat peaked at 1.98 on 10/31 now down to 1.73 (baseline around 1.8-2.2) Plan Avoid hypo and hypertension  Renal dose meds Strict I&O Careful consideration w/ dye loads during cardiac cath   T2DM Plan Continue SSI CBG goal 140-180   Chronic anemia Stable, Note he is on heparin gtt Plan Trend CBC  Transfuse per protocol  Hgb goal greater  greater than 7  GERD Hiatal hernia Plan Continue PPI Reflux precautions   Best Practice (right click and "Reselect all SmartList Selections" daily)   Diet/type: Regular consistency (see orders) DVT prophylaxis: systemic heparin GI prophylaxis: PPI Lines: N/A Foley:  N/A Code Status:  full code Last date of multidisciplinary goals of care discussion: Continue current aggressive interventions, update patient and family daily  Critical care time: NA

## 2023-05-10 NOTE — Progress Notes (Signed)
PHARMACY - ANTICOAGULATION Pharmacy Consult for heparin Indication: chest pain/ACS Brief A/P: Heparin level subtherapeutic Increase Heparin rate  Allergies  Allergen Reactions   Advil [Ibuprofen] Other (See Comments)    Intolerance due to kidney problem   Aleve [Naproxen] Other (See Comments)    Intolerance due to kidney problem    Patient Measurements: Height: 5\' 5"  (165.1 cm) Weight: 84 kg (185 lb 3 oz) IBW/kg (Calculated) : 57 Heparin Dosing Weight: 76 kg  Vital Signs: Temp: 98.8 F (37.1 C) (11/01 0335) Temp Source: Oral (11/01 0335) BP: 124/52 (11/01 0435) Pulse Rate: 62 (11/01 0435)  Labs: Recent Labs    05/08/23 0313 05/08/23 1832 05/09/23 0259 05/09/23 1354 05/09/23 1416 05/09/23 1419 05/10/23 0314  HGB 9.5*  --  9.2*   < > 8.8* 8.8* 8.7*  HCT 29.4*  --  28.9*   < > 26.0* 26.0* 27.8*  PLT 182  --  199  --   --   --  207  HEPARINUNFRC <0.10* 0.44 0.39  --   --   --  <0.10*  CREATININE 1.92*  --  1.98*  --   --   --  1.73*   < > = values in this interval not displayed.    Estimated Creatinine Clearance: 28.2 mL/min (A) (by C-G formula based on SCr of 1.73 mg/dL (H)).   Assessment: 77 yo female with NSTEMI s/p cath, awaiting PCI, for heparin.   Goal of Therapy:  Heparin level 0.3-0.7 units/ml Monitor platelets by anticoagulation protocol: Yes   Plan:  Increase Heparin 1500 units/hr Check heparin level in 8 hours.  Geannie Risen, PharmD, BCPS

## 2023-05-10 NOTE — Progress Notes (Signed)
PT Cancellation Note  Patient Details Name: Amber [name] MRN: [id number] DOB: 05-11-1946   Cancelled Treatment:    Reason Eval/Treat Not Completed: Patient at procedure or test/unavailable; off the floor for heart cath.  Will attempt later as able.    Elray Mcgregor 05/10/2023, 11:57 AM Sheran Lawless, PT Acute Rehabilitation Services [phone] 05/10/2023

## 2023-05-10 NOTE — Progress Notes (Signed)
eLink Physician-Brief Progress Note Patient Name: Amber [name] DOB: [birth year] MRN: [id number]   Date of Service  05/10/2023  HPI/Events of Note  SOB  eICU Interventions   BiPAP   CAD PH wedge 15, mPAP 32, PVR:2.7 Precapillary, CO 5.2, CI 2.7 S/P PCI Well diurese Negative balance Improving  AKI  CX: cardionegaly Enlarged RA No overt CHF Place BiPAP Continue Off further diuresis at this time    Intervention Category Major Interventions: Respiratory failure - evaluation and management  Kasir Hallenbeck 05/10/2023, 11:10 PM

## 2023-05-10 NOTE — Interval H&P Note (Signed)
History and Physical Interval Note:  05/10/2023 11:04 AM  Amber [name]  has presented today for surgery, with the diagnosis of CAD.  The various methods of treatment have been discussed with the patient and family. After consideration of risks, benefits and other options for treatment, the patient has consented to  Procedure(s): CORONARY STENT INTERVENTION (N/A) as a surgical intervention.  The patient's history has been reviewed, patient examined, no change in status, stable for surgery.  I have reviewed the patient's chart and labs.  Questions were answered to the patient's satisfaction.    Cath Lab Visit (complete for each Cath Lab visit)  Clinical Evaluation Leading to the Procedure:   ACS: Yes.    Non-ACS:    Anginal Classification: CCS IV  Anti-ischemic medical therapy: Maximal Therapy (2 or more classes of medications)  Non-Invasive Test Results: No non-invasive testing performed  Prior CABG: No previous CABG    Bryan Lemma

## 2023-05-10 NOTE — H&P (View-Only) (Signed)
Rounding Note    Patient Name: Amber [name] Date of Encounter: 05/10/2023  [facility] Cardiologist: Marlyn Corporal Madireddy, MD   Subjective   No chest pain or shortness of breath.  Inpatient Medications    Scheduled Meds:  amLODipine  10 mg Oral Daily   aspirin EC  81 mg Oral Daily   atorvastatin  40 mg Oral Daily   Chlorhexidine Gluconate Cloth  6 each Topical Daily   cholecalciferol  5,000 Units Oral Daily   fluticasone furoate-vilanterol  1 puff Inhalation Daily   hydrALAZINE  75 mg Oral Q8H   insulin aspart  0-15 Units Subcutaneous TID WC   metoprolol succinate  50 mg Oral BID   mouth rinse  15 mL Mouth Rinse 4 times per day   pantoprazole  40 mg Oral Daily   sodium chloride flush  3 mL Intravenous Q12H   Continuous Infusions:  sodium chloride     heparin 1,500 Units/hr (05/10/23 0551)   nitroGLYCERIN 30 mcg/min (05/10/23 0400)   PRN Meds: sodium chloride, acetaminophen, ipratropium-albuterol, nitroGLYCERIN, ondansetron (ZOFRAN) IV, mouth rinse, mouth rinse, sodium chloride flush   Vital Signs    Vitals:   05/10/23 0615 05/10/23 0630 05/10/23 0645 05/10/23 0700  BP: (!) 156/63 (!) 154/59 (!) 148/66 127/72  Pulse: 60 62 60 (!) 59  Resp: (!) 23 20 (!) 26 (!) 21  Temp:      TempSrc:      SpO2: 96% 96% 96% 96%  Weight:      Height:        Intake/Output Summary (Last 24 hours) at 05/10/2023 0809 Last data filed at 05/10/2023 0400 Gross per 24 hour  Intake 431.16 ml  Output 725 ml  Net -293.84 ml   I/O -448.1 since admission      05/10/2023    6:10 AM 05/09/2023    5:00 AM 05/08/2023    5:00 AM  Last 3 Weights  Weight (lbs) 187 lb 13.3 oz 185 lb 3 oz 181 lb 3.2 oz  Weight (kg) 85.2 kg 84 kg 82.192 kg      Telemetry    Sinus in the  60s with rare PVCs- Personally Reviewed  ECG   05/08/2023 ECG (independently read by me): Normal sinus rhythm at 66.  T wave inversion in leads I, aVL, V2 through V4.  05/06/2023 ECG  (independently read by me): Normal sinus rhythm at 84 with QS complex V1 through V3 and mild ST abnormality inferiorly.  Physical Exam   BP 127/72   Pulse (!) 59   Temp 98.8 F (37.1 C) (Oral)   Resp (!) 21   Ht 5\' 5"  (1.651 m)   Wt 85.2 kg   SpO2 96%   BMI 31.26 kg/m  General: Alert, oriented, no distress.  Skin: normal turgor, no rashes, warm and dry HEENT: Normocephalic, atraumatic. Pupils equal round and reactive to light; sclera anicteric; extraocular muscles intact;  Nose without nasal septal hypertrophy Mouth/Parynx benign; Mallinpatti scale 4 Neck: No JVD, no carotid bruits; normal carotid upstroke Lungs: decreased BS, improved; no wheezing or rales Chest wall: without tenderness to palpitation Heart: PMI not displaced, RRR, s1 s2 normal, 1/6 systolic murmur, no diastolic murmur, no rubs, gallops, thrills, or heaves Abdomen: soft, nontender; no hepatosplenomehaly, BS+; abdominal aorta nontender and not dilated by palpation. Back: no CVA tenderness Pulses 2+: L radial site stable Musculoskeletal: full range of motion, normal strength, no joint deformities Extremities: ecchymosis L arm from iv infusion several days  before cath; no clubbing cyanosis or edema, Homan's sign negative  Neurologic: grossly nonfocal; Cranial nerves grossly wnl Psychologic: Normal mood and affect      Labs    High Sensitivity Troponin:   Recent Labs  Lab 05/03/23 2224 05/04/23 0017  TROPONINIHS 4,223* 3,862*     Chemistry Recent Labs  Lab 05/04/23 0732 05/05/23 0452 05/08/23 0313 05/09/23 0259 05/09/23 1354 05/09/23 1416 05/09/23 1419 05/10/23 0314  NA 137   < > 135 136   < > 139 139 139  K 4.6   < > 3.3* 3.8   < > 3.8 3.7 3.9  CL 107   < > 103 102  --   --   --  107  CO2 22   < > 24 24  --   --   --  23  GLUCOSE 128*   < > 145* 150*  --   --   --  128*  BUN 46*   < > 32* 43*  --   --   --  29*  CREATININE 2.65*   < > 1.92* 1.98*  --   --   --  1.73*  CALCIUM 8.8*   < > 8.6*  8.7*  --   --   --  8.5*  PROT 5.6*  --   --   --   --   --   --  4.9*  ALBUMIN 3.6  --   --   --   --   --   --  3.0*  AST 33  --   --   --   --   --   --  14*  ALT 15  --   --   --   --   --   --  17  ALKPHOS 70  --   --   --   --   --   --  58  BILITOT 0.8  --   --   --   --   --   --  0.6  GFRNONAA 18*   < > 26* 25*  --   --   --  30*  ANIONGAP 8   < > 8 10  --   --   --  9   < > = values in this interval not displayed.    Lipids  Recent Labs  Lab 05/08/23 0313  CHOL 126  TRIG 135  HDL 33*  LDLCALC 66  CHOLHDL 3.8    Hematology Recent Labs  Lab 05/08/23 0313 05/09/23 0259 05/09/23 1354 05/09/23 1416 05/09/23 1419 05/10/23 0314  WBC 10.3 9.2  --   --   --  8.9  RBC 3.44* 3.42*  --   --   --  3.23*  HGB 9.5* 9.2*   < > 8.8* 8.8* 8.7*  HCT 29.4* 28.9*   < > 26.0* 26.0* 27.8*  MCV 85.5 84.5  --   --   --  86.1  MCH 27.6 26.9  --   --   --  26.9  MCHC 32.3 31.8  --   --   --  31.3  RDW 14.6 14.6  --   --   --  14.5  PLT 182 199  --   --   --  207   < > = values in this interval not displayed.   Thyroid No results for input(s): "TSH", "FREET4" in the last 168 hours.  BNP Recent Labs  Lab 05/04/23  0732 05/06/23 0639 05/09/23 0259  BNP 996.7* 1,419.4* 836.4*    DDimer No results for input(s): "DDIMER" in the last 168 hours.   Radiology    CARDIAC CATHETERIZATION  Result Date: 05/09/2023   Dist RCA lesion is 60% stenosed.   Prox LAD lesion is 80% stenosed.   Ost Cx lesion is 30% stenosed. High-grade proximal LAD lesion; the patient will receive return for staged PCI depending on creatinine. Moderate distal right coronary artery disease and mild ostial circumflex disease. Fick cardiac output of 5.2 L/min and Fick cardiac index of 2.7 L/min/m with the following hemodynamics: Right atrial pressure mean of 6 mmHg Right ventricular pressure 49/0 with an end-diastolic pressure of 13 mmHg Wedge pressure mean of 15 mmHg with V waves to 20 mmHg Pulmonary artery pressure  49/18 with a mean of 30 mmHg PVR of 2.87 Woods units Pulmonary artery pulsatility index of 5 4.  LVEDP of 34 mmHg. Recommendation: Staged PCI of proximal LAD lesion; resume heparin infusion 2 hours after TR band has been removed.    Cardiac Studies   CATH: 08/16/2017 ([facility]): - Ost LAD to Prox LAD lesion is 50% stenosed.   Ost Cx to Prox Cx lesion is 30% stenosed.   Mid RCA lesion is 40% stenosed.     ECHO FROM [facility] 05/02/2023 - LV: LVEF 45-50% with hypokinesis of apical/ septal and apical inferior segments as well as apex. Diastolic function has an impaired relaxation pattern. - RV: Normal size and function. - LA: Mildly dilated. - RA: Normal size. - Mital Valve: Mild regurgitation. No stenosis.  - Aortic Valve: Mild sclerosis but no stenosis. No regurgitation. - Pulmonic Valve: No stenosis or regurgitation. - Tricuspid Valve: Trivial regurgitation. RVSP mildly elevated at 39 mmHg. - Pericardium: No effusion. - Aorta: Aortic root and ascending/ descending aorta were poorly visualized.    CATH: 05/09/2023   Dist RCA lesion is 60% stenosed.   Prox LAD lesion is 80% stenosed.   Ost Cx lesion is 30% stenosed.   High-grade proximal LAD lesion; the patient will receive return for staged PCI depending on creatinine. Moderate distal right coronary artery disease and mild ostial circumflex disease. Fick cardiac output of 5.2 L/min and Fick cardiac index of 2.7 L/min/m with the following hemodynamics: Right atrial pressure mean of 6 mmHg Right ventricular pressure 49/0 with an end-diastolic pressure of 13 mmHg Wedge pressure mean of 15 mmHg with V waves to 20 mmHg Pulmonary artery pressure 49/18 with a mean of 30 mmHg PVR of 2.87 Woods units Pulmonary artery pulsatility index of 5 4.  LVEDP of 34 mmHg.   Recommendation: Staged PCI of proximal LAD lesion; resume heparin infusion 2 hours after TR band has been removed.      Patient Profile     77 y.o.  female  with history of mild non-obstructive CAD on cardiac catheterization in 08/2017, hypertension, hyperlipidemia, type 2 diabetes mellitus, CKD stage III with solitary kidney, GERD, hiatal hernia, chronic anemia, right breast cancer s/p mastectomy in 2020, and obesity who presented to [facility] on [facility]2023 from the Cardiology office for further evaluation of chest pain and dyspnea on exertion. Troponin I remained negative at [facility] and she underwent a Myoview which showed reversible ischemia in LAD distribution. She was transferred to [facility] for further evaluation with cardiac catheterization. High-sensitivity upon arrival to [facility] was elevated and she had now ruled in for a NSTEMI.   Assessment & Plan  NSTEMI: Troponins have increased to 4223.  Patient has experienced chest pain and shortness of breath with exertion for the last 2 to 3 months.  She is on IV heparin and is now back on IV nitroglycerin now on 20 mcg.  She is not having any recurrent chest pain.  She continues to be on aspirin, metoprolol 50 mg twice a day, and amlodipine which was increased to 10 mg.  Catheterization data and images personally reviewed.  There is high-grade 80 to 90% very proximal LAD stenosis with moderate disease extending to the left main, as well as 30 to 40% ostial circumflex stenosis.  Patient has been hydrated post procedure.  Creatinine improved from 1.98-1.73 today.  Plan is for PCI to LAD ostial proximal region.  Patient is without chest pain.  She continues to be on IV nitroglycerin and heparin.  Patient aware of risk/benefits of the procedure. Hypertension: Pressure had increased to the upper 140s, have increased NTG drip to 20 mcg.  She is also on hydralazine 50 mg every 8 hours and increased amlodipine dose of 10 mg in addition to her previous metoprolol succinate 50 mg twice a day regimen.  BP today improved currently at 134/60 CHF: Over the last several days patient received  furosemide 80 and 60 mg 3 days ago, then  40 mg IV x 2, and yesterday 40 mg x 1.  BNP had increased to 1419.  Previous EF was 45 to 50% on echo at [facility] with hypokinesis of the apical/septal and apical inferior segments as well as apex.  She also had impaired diastolic relaxation.  BNP 10/31 remains elevated but improved from 1419 > 836.   AKI with baseline creatinine around 2.1.  Creatinine had increased to 2.65 on 10/26 and had improved down to 1.71 fluids but subsequently she developed recent respiratory distress requiring diuresis.  Creatinine subsequently had increased to 1.98.  Currently, creatinine today is 1.74.  No rales on exam Respiratory distress:  Catheterization was canceled earlier in the week due to acute respiratory distress.  Appreciate pulmonary evaluation.  Patient now is on BiPAP while sleeping.  She is on Breo and as needed albuterol.  There is no wheezing today but she continues to have decreased breath sounds.  Tolerated catheterization yesterday and this was performed without her on BiPAP treatment. Hypokalemia: Potassium 3.3  improved to 3.8 today and 3.9 today. Hyperlipidemia: Had been on pravastatin, will change to atorvastatin 40 mg; DL this morning 66 Type 2 diabetes mellitus.  A1c 5.4.  At home patient was on glimepiride and pioglitazone, currently on hold     Plan for staged PCI to LAD today  For questions or updates, please contact [facility] Please consult [email or web address] for contact info under       Signed, Nicki Guadalajara, MD  05/10/2023, 8:09 AM

## 2023-05-11 DIAGNOSIS — I509 Heart failure, unspecified: Secondary | ICD-10-CM | POA: Insufficient documentation

## 2023-05-11 DIAGNOSIS — I2510 Atherosclerotic heart disease of native coronary artery without angina pectoris: Secondary | ICD-10-CM | POA: Insufficient documentation

## 2023-05-11 DIAGNOSIS — I5021 Acute systolic (congestive) heart failure: Secondary | ICD-10-CM

## 2023-05-11 DIAGNOSIS — I214 Non-ST elevation (NSTEMI) myocardial infarction: Secondary | ICD-10-CM | POA: Diagnosis not present

## 2023-05-11 DIAGNOSIS — I160 Hypertensive urgency: Secondary | ICD-10-CM | POA: Insufficient documentation

## 2023-05-11 HISTORY — DX: Acute systolic (congestive) heart failure: I50.21

## 2023-05-11 HISTORY — DX: Hypertensive urgency: I16.0

## 2023-05-11 HISTORY — DX: Heart failure, unspecified: I50.9

## 2023-05-11 HISTORY — DX: Atherosclerotic heart disease of native coronary artery without angina pectoris: I25.10

## 2023-05-11 LAB — CBC
HCT: 27.3 % — ABNORMAL LOW (ref 36.0–46.0)
Hemoglobin: 8.6 g/dL — ABNORMAL LOW (ref 12.0–15.0)
MCH: 27.1 pg (ref 26.0–34.0)
MCHC: 31.5 g/dL (ref 30.0–36.0)
MCV: 86.1 fL (ref 80.0–100.0)
Platelets: 219 10*3/uL (ref 150–400)
RBC: 3.17 MIL/uL — ABNORMAL LOW (ref 3.87–5.11)
RDW: 14.6 % (ref 11.5–15.5)
WBC: 10.9 10*3/uL — ABNORMAL HIGH (ref 4.0–10.5)
nRBC: 0 % (ref 0.0–0.2)

## 2023-05-11 LAB — BASIC METABOLIC PANEL
Anion gap: 10 (ref 5–15)
BUN: 24 mg/dL — ABNORMAL HIGH (ref 8–23)
CO2: 21 mmol/L — ABNORMAL LOW (ref 22–32)
Calcium: 8.4 mg/dL — ABNORMAL LOW (ref 8.9–10.3)
Chloride: 107 mmol/L (ref 98–111)
Creatinine, Ser: 1.89 mg/dL — ABNORMAL HIGH (ref 0.44–1.00)
GFR, Estimated: 27 mL/min — ABNORMAL LOW (ref >60)
Glucose, Bld: 171 mg/dL — ABNORMAL HIGH (ref 70–99)
Potassium: 3.7 mmol/L (ref 3.5–5.1)
Sodium: 138 mmol/L (ref 135–145)

## 2023-05-11 LAB — GLUCOSE, CAPILLARY
Glucose-Capillary: 137 mg/dL — ABNORMAL HIGH (ref 70–99)
Glucose-Capillary: 157 mg/dL — ABNORMAL HIGH (ref 70–99)
Glucose-Capillary: 176 mg/dL — ABNORMAL HIGH (ref 70–99)
Glucose-Capillary: 149 mg/dL — ABNORMAL HIGH (ref 70–99)
Glucose-Capillary: 154 mg/dL — ABNORMAL HIGH (ref 70–99)

## 2023-05-11 MED ORDER — ORAL CARE MOUTH RINSE: 15.0000 mL | OROMUCOSAL | Status: DC | PRN

## 2023-05-11 NOTE — Progress Notes (Signed)
Progress Note  Patient Name: Amber [name] Date of Encounter: 05/11/2023  Primary Cardiologist: Marlyn Corporal Madireddy, MD   Subjective   No chest pain or sob.   Inpatient Medications    Scheduled Meds:  amLODipine  10 mg Oral Daily   aspirin EC  81 mg Oral Daily   atorvastatin  40 mg Oral Daily   Chlorhexidine Gluconate Cloth  6 each Topical Daily   cholecalciferol  5,000 Units Oral Daily   fluticasone furoate-vilanterol  1 puff Inhalation Daily   heparin  5,000 Units Subcutaneous Q8H   hydrALAZINE  75 mg Oral Q8H   insulin aspart  0-15 Units Subcutaneous TID WC   metoprolol succinate  50 mg Oral BID   pantoprazole  40 mg Oral Daily   sodium chloride flush  3 mL Intravenous Q12H   sodium chloride flush  3 mL Intravenous Q12H   ticagrelor  90 mg Oral BID   Continuous Infusions:  sodium chloride     nitroGLYCERIN 45 mcg/min (05/11/23 0800)   PRN Meds: sodium chloride, acetaminophen, ipratropium-albuterol, morphine injection, nitroGLYCERIN, ondansetron (ZOFRAN) IV, mouth rinse, sodium chloride flush, sodium chloride flush   Vital Signs    Vitals:   05/11/23 0730 05/11/23 0745 05/11/23 0800 05/11/23 0815  BP: (!) 148/71 (!) 150/93 (!) 146/69 (!) 146/79  Pulse: 67 71 68 66  Resp: (!) 21 20 (!) 24 (!) 24  Temp:   98 F (36.7 C)   TempSrc:   Oral   SpO2: 98% 99% 100% 100%  Weight:      Height:        Intake/Output Summary (Last 24 hours) at 05/11/2023 0957 Last data filed at 05/11/2023 8295 Gross per 24 hour  Intake 1126.91 ml  Output 650 ml  Net 476.91 ml   Filed Weights   05/09/23 0500 05/10/23 0610 05/11/23 0600  Weight: 84 kg 85.2 kg 85.3 kg    Telemetry    nsr - Personally Reviewed  ECG    none - Personally Reviewed  Physical Exam   GEN: No acute distress.   Neck: No JVD Cardiac: RRR, no murmurs, rubs, or gallops.  Respiratory: Clear to auscultation bilaterally. GI: Soft, nontender, non-distended  MS: No edema; No deformity. Neuro:   Nonfocal  Psych: Normal affect   Labs    Chemistry Recent Labs  Lab 05/09/23 0259 05/09/23 1354 05/09/23 1419 05/10/23 0314 05/10/23 1439 05/11/23 0239  NA 136   < > 139 139  --  138  K 3.8   < > 3.7 3.9  --  3.7  CL 102  --   --  107  --  107  CO2 24  --   --  23  --  21*  GLUCOSE 150*  --   --  128*  --  171*  BUN 43*  --   --  29*  --  24*  CREATININE 1.98*  --   --  1.73* 1.58* 1.89*  CALCIUM 8.7*  --   --  8.5*  --  8.4*  PROT  --   --   --  4.9*  --   --   ALBUMIN  --   --   --  3.0*  --   --   AST  --   --   --  14*  --   --   ALT  --   --   --  17  --   --   ALKPHOS  --   --   --  58  --   --   BILITOT  --   --   --  0.6  --   --   GFRNONAA 25*  --   --  30* 33* 27*  ANIONGAP 10  --   --  9  --  10   < > = values in this interval not displayed.     Hematology Recent Labs  Lab 05/10/23 0314 05/10/23 1439 05/11/23 0239  WBC 8.9 9.8 10.9*  RBC 3.23* 3.24* 3.17*  HGB 8.7* 8.6* 8.6*  HCT 27.8* 27.0* 27.3*  MCV 86.1 83.3 86.1  MCH 26.9 26.5 27.1  MCHC 31.3 31.9 31.5  RDW 14.5 14.4 14.6  PLT 207 227 219    Cardiac EnzymesNo results for input(s): "TROPONINI" in the last 168 hours. No results for input(s): "TROPIPOC" in the last 168 hours.   BNP Recent Labs  Lab 05/06/23 0639 05/09/23 0259  BNP 1,419.4* 836.4*     DDimer No results for input(s): "DDIMER" in the last 168 hours.   Radiology    CARDIAC CATHETERIZATION  Result Date: 05/10/2023   Ost LAD to Prox LAD lesion is 80% stenosed.   A drug-eluting stent was successfully placed using a SYNERGY XD 2.50X16. =>   Ost Cx lesion is 30% stenosed.   Post intervention, there is a 0% residual stenosis.   Anticipated discharge date to be determined. Successful DES PCI of Ostial-Proximal LAD with Synergy XD DES 12.5 mm x 16 mm - deployed to 2.6 mm & prox 3/4 of stent post-dilated to 2.8 mm. Reduced from 80% to 0% with TIMI 3 maintained. RECOMMENDATION Return to ICU for ongoing care with femoral sheath  removal-manual pressure.   Patient will need post cath hydration and further titration of GDMT for CAD and admitted with HFmrEF (EF ~45%)   Recommend dual antiplatelet therapy with Aspirin 81mg  daily and Ticagrelor 90mg  twice daily long-term (beyond 12 months) because of Ostial LAD stent.   After 1 year of DAPT with ticagrelor 90 mg twice daily and ASA.   Would reduce to maintenance dose of 60 mg twice daily and discontinue aspirin with plans to continue SAPT out at least through year #2 Bryan Lemma, MD  CARDIAC CATHETERIZATION  Result Date: 05/09/2023   Dist RCA lesion is 60% stenosed.   Prox LAD lesion is 80% stenosed.   Ost Cx lesion is 30% stenosed. High-grade proximal LAD lesion; the patient will receive return for staged PCI depending on creatinine. Moderate distal right coronary artery disease and mild ostial circumflex disease. Fick cardiac output of 5.2 L/min and Fick cardiac index of 2.7 L/min/m with the following hemodynamics: Right atrial pressure mean of 6 mmHg Right ventricular pressure 49/0 with an end-diastolic pressure of 13 mmHg Wedge pressure mean of 15 mmHg with V waves to 20 mmHg Pulmonary artery pressure 49/18 with a mean of 30 mmHg PVR of 2.87 Woods units Pulmonary artery pulsatility index of 5 4.  LVEDP of 34 mmHg. Recommendation: Staged PCI of proximal LAD lesion; resume heparin infusion 2 hours after TR band has been removed.    Cardiac Studies   See above  Patient Profile     77 y.o. female admitted with NSTEMI, s/p PCI  Assessment & Plan    NSTEMI - she is s/p PCI 80% LAD. Continue GDMT. We will wean off NTG Acute on chronic renal insufficiency - her creatinine is up to 1.9 today from 1.6. She will remain in the hospital another day for hydration. DC  tomorrow if renal function is stable.      For questions or updates, please contact [facility] Please consult [email or web address] for contact info under Cardiology/STEMI.      Signed, Lewayne Bunting, MD  05/11/2023,  9:57 AM

## 2023-05-11 NOTE — Discharge Summary (Incomplete)
Discharge Summary    Patient ID: [name] MRN: [id number]; DOB: 07/25/1945  Admit date: 05/03/2023 Discharge date: 05/12/2023  PCP:  Abner Greenspan, MD   [facility] Providers Cardiologist:  Marlyn Corporal Madireddy, MD        Discharge Diagnoses    Principal Problem:   Non-ST elevation (NSTEMI) myocardial infarction [facility]) Active Problems:   CAD (coronary artery disease)   Acute respiratory failure with hypoxia (HCC)   Acute HFrEF (heart failure with reduced ejection fraction) (HCC)   Hypertensive urgency   GERD (gastroesophageal reflux disease)   HLD (hyperlipidemia)   Acute kidney injury superimposed on chronic kidney disease (HCC)   Type 2 diabetes mellitus without complication, without long-term current use of insulin (HCC)   Chronic anemia   CKD (chronic kidney disease) stage 4, GFR 15-29 ml/min (HCC)   Chronic obstructive pulmonary disease (HCC)    Diagnostic Studies/Procedures    Echocardiogram 05/02/2023 [facility]): Impressions: - LV: Normal in size. LVEF 45-50%. Hypokinesis of apical septal segment, apical inferior segment, and apex. Impaired relaxation pattern. - RV: Normal size and function.  - LA: Mildly dilated. - RA: Normal size. - Mitral valve: Mildly thickened leaflets. Mild regurgitation. No stenosis. - Aortic valve: Trileaflet. Mildly sclerotic but no evidence of stenosis. No significant regurgitation noted.  - Pulmonic valve: No stenosis or regurgitation. - Tricuspid valve: Trivial regurgitation. Mildly elevated PASP of 39 mmHg.  - Pericardium: Not well visualized. No effusion visualized.  - Aorta: Poorly visualized.  _____________  Right/ Left Cardiac Catheterization 05/09/2023:   Dist RCA lesion is 60% stenosed.   Prox LAD lesion is 80% stenosed.   Ost Cx lesion is 30% stenosed.   High-grade proximal LAD lesion; the patient will receive return for staged PCI depending on creatinine. Moderate distal right coronary artery  disease and mild ostial circumflex disease. Fick cardiac output of 5.2 L/min and Fick cardiac index of 2.7 L/min/m with the following hemodynamics: Right atrial pressure mean of 6 mmHg Right ventricular pressure 49/0 with an end-diastolic pressure of 13 mmHg Wedge pressure mean of 15 mmHg with V waves to 20 mmHg Pulmonary artery pressure 49/18 with a mean of 30 mmHg PVR of 2.87 Woods units Pulmonary artery pulsatility index of 5 4.  LVEDP of 34 mmHg.   Recommendation: Staged PCI of proximal LAD lesion; resume heparin infusion 2 hours after TR band has been removed.  Diagnostic Dominance: Right  _______________  Coronary Stent Intervention 05/10/2023:   Ost LAD to Prox LAD lesion is 80% stenosed.   A drug-eluting stent was successfully placed using a SYNERGY XD 2.50X16. =>   Ost Cx lesion is 30% stenosed.   Post intervention, there is a 0% residual stenosis.   Anticipated discharge date to be determined.   Successful DES PCI of Ostial-Proximal LAD with Synergy XD DES 12.5 mm x 16 mm - deployed to 2.6 mm & prox 3/4 of stent post-dilated to 2.8 mm. Reduced from 80% to 0% with TIMI 3 maintained.    Recommendation: Return to ICU for ongoing care with femoral sheath removal-manual pressure.   Patient will need post cath hydration and further titration of GDMT for CAD and admitted with HFmrEF (EF ~45%)   Recommend dual antiplatelet therapy with Aspirin 81mg  daily and Ticagrelor 90mg  twice daily long-term (beyond 12 months) because of Ostial LAD stent.   After 1 year of DAPT with ticagrelor 90 mg twice daily and ASA.   Would reduce to maintenance dose of 60 mg twice  daily and discontinue aspirin with plans to continue SAPT out at least through year #2  Diagnostic Dominance: Right  Intervention         History of Present Illness     Amber [name] is a 77 y.o. female with a history of mild non-obstructive CAD on ca cardiac catheterization in 08/2017, hypertension,  hyperlipidemia, type 2 diabetes mellitus, CKD stage III with solitary kidney, GERD, hiatal hernia, chronic anemia, right breast cancer s/p mastectomy in 2020, and obesity.   Patient has a history of mild nonobstructive CAD on a cardiac catheterization in 08/2017. He was recently seen in the office by Dr. Vincent Gros on 05/01/2023 at which time she reported worsening symptoms of chest discomfort for the last couple months that sounded atypical and more related to reflux secondary to her hiatal hernia. However, she also described chest pressure that radiated to both shoulders with minimal ambulation with day-to-day activities at home which sounded more cardiac in nature. In addition, she reported significant dyspnea on exertion which had worsened over the past couple of months as well as a 20 lb weight gain. Therefore, she was sent to the ED for further evaluation.  Upon arrival to the [facility] ED, she was noted to be hypertensive with systolic BP in the 200s. EKG reportedly showed sinus bradycardia with no acute ST/T changes. Troponin I negative x5. Pro BNP 652. WBC 8.5, Hgb 11.4, Plts 191. Na 140, K 3.6, Glucose 111, BUN 37, Cr 2.30. AST 17, ALT 13, Alk Phos 99, Total Bili 0.4. Lactic acid 0.7. Procalcitonin <0.05. She was treated with IV Hydralazine and IV Lasix in the ED and admitted. Echo showed LVEF of 45-50% with hypokinesis of apical septal segment, apical inferior segment, and apex and impaired relaxation pattern as well as normal RV size and function, mild mitral regurgitation, and mildly elevated PASP of 39 mmHg.  Myoview which showed reversible ischemia suggesting a possible LAD occlusion. Therefore, she was transferred to [facility] for cardiac catheterization.   Upon arrival to [facility], she was having 4/10 chest pain that gradually worsened to 8/10 pain. She also vomited after arriving to the [facility]. Repeat EKG showed normal sinus rhythm with with Q waves in lead III and V1-V3 as well  as a mildly elevated ST elevated with in V2 which was new compared to EKG from 05/01/2023. Pain improved with sublingual Nitroglycerin so she was started on an IV Nitroglycerin drip.   Hospital Course     Consultants: PCCM   NSTEMI CAD Patient initially presented to [facility] on 05/01/2023 for exertional chest pain and dyspnea. Troponin I was negative x5 at [facility]. Echo showed LVEF of 45-50% with hypokinesis of apical septal segment, apical inferior segment, and apex. Myoview showed reversible ischemia suggesting a possible LAD occlusion. Therefore, she was transferred to [facility] on 05/03/2023 for cardiac catheterization. High-sensitivity troponin at [facility] was elevated peaking at 4,223 ruling in for NSTEMI. She was started on IV Heparin and IV Nitroglycerin. She was hydrated with IV fluids given renal function and then taken down to the Cath Lab on 05/06/2023; however, the case had to be cancelled due to acute respiratory failure due to acute CHF requiring BiPAP and IV Lasix. She was diuresed and then taken back to the Cath Lab on 05/09/2023 for R/ LHC which showed 80% stenosis of proximal LAD, 60% stenosis of distal RCA, and 30% stenosis of ostial LCX (see RHC results below). Staged PCI of proximal LAD lesion was recommended. She  was brought back to the Cath Lab once again on 05/10/2023 and underwent successful PCI with DES to ostial to proximal LAD. She tolerated this procedure well. She was started on DAPT with Aspirin 81mg  daily and Brilinta 90mg  twice daily. Home Toprol-XL was continued. Home Pravastatin was stopped and she was started on Lipitor instead.   Of note, I sent a 30 day prescription of the Brilinta to [facility] pharmacy and then went ahead and sent a 90 day prescription to mail-order pharmacy.  Acute Hypoxic Respiratory Failure Acute on Chronic HFmrEF Pro BNP at [facility] was elevated at 652 and she was given a dose of IV Lasix in the ED there. Repeat BNP at [facility] was  996. However, she was not given any additional Lasix given renal function and need for cardiac catheterization and the fact that dyspnea had improved and she did not look markedly overloaded. She was started on gentle IV fluids on 05/05/2023 in anticipation for [facility] She unfortunately developed acute hypoxic respiratory failure on 05/06/2023 which led to her cardiac catheterization being aborted. She was started on BiPAP and IV Lasix. PCCM was consulted and assisted with management of respiratory failure. RHC on 05/07/2023 showed mean RA pressure of , RV EDP of 13 mmHg, PCWP with V waves to 20 mmHg, mean PAP of 20 mmHg, PVR of 2.87 Woods units, LVEDP of 34 mmHg, and Fick cardiac output of 5.2 L/min and cardiac index of 2.7 L/min/m^2. IV Lasix was stopped on 05/08/2023. Discharge weight 188 lbs. Will resume home Lasix 40mg  daily and Kcl 10mg  twice daily at discharge. Continue home Toprol-XL 50mg  twice daily. Losartan was initially held on admission due to renal function. Renal function has improved. Will resume Losartan at discharge but at lower dose of 25mg  daily. No Spironolactone given renal function. Could consider adding a SGLT2 inhibitor as an outpatient. Will need repeat BMET at follow-up visit.  Hypertensive Urgency Systolic BP >200 on arrival to [facility]. BP improved throughout admission. She was started on IV Nitroglycerin and PO medications were adjusted. Will discharge patient on Amlodipine 10mg  daily (on 5mg  daily at home), Toprol-XL 50mg  twice daily, Hydralazine 75mg  three times daily (on 50mg  three times daily at home), and Losartan 25mg  daily (on 100mg  daily at home).    Hyperlipidemia Lipid panel at [facility]: Total Cholesterol 121, Triglycerides 152, HDL 33, LDL 57. Home Pravastatin stopped and she was started on Lipitor 40mg  daily. Will need repeat lipid panel and LFTs in 6- 8 weeks.    Type 2 Diabetes Mellitus Hemoglobin A1c at [facility] 5.0%. She did have a severe  hypoglycemic episode one night at [place] with glucose dropping to 37 requiring D5 half normal saline. Home medications were held during admission and she was placed on sliding scale insulin with no recurrent hypoglycemic episodes. Can resume home regimen (Glimepiride and Pioglitazone) at discharge.    AKI on CKD Stage IIIb Baseline creatinine is around 2.1. Creatinine was 2.6 on arrival to [facility]. Creatinine has fluctuated some this admission but overall improved. Creatinine stable at 1.73 on discharge. Will need repeat BMET at follow-up visit in 1 week.   Hiatal Hernia GERD  Patient has a history of GERD and hiatal hernia that is being treated medically. She states last EGD was about 1 year ago. Some of patient's initial chest pain symptoms sounded GI in nature. Continue home Omeprazole.   COPD Acute respiratory failure this admission was due to CHF and pulmonary edema not COPD. Continue home inhalers.  Chronic Normocytic Anemia Hemoglobin initially 14 on arrival to [place] which is around her baseline. Hemoglobin has gradually declined during admission and dropped as low as 8.6 on 11/1 and 11/2. Stable at 9.1 on day of discharge. Recommend repeat CBC at follow-up visit.  Patient seen and examined by Dr. Ronaldo Miyamoto today and determined to be stable for discharge. Outpatient follow-up has been arranged. Medications as below.      Did the patient have an acute coronary syndrome (MI, NSTEMI, STEMI, etc) this admission?:  Yes                               AHA/ACC ACS Clinical Performance & Quality Measures: Aspirin prescribed? - Yes ADP Receptor Inhibitor (Plavix/Clopidogrel, Brilinta/Ticagrelor or Effient/Prasugrel) prescribed (includes medically managed patients)? - Yes Beta Blocker prescribed? - Yes High Intensity Statin (Lipitor 40-80mg  or Crestor 20-40mg ) prescribed? - Yes EF assessed during THIS hospitalization? - Yes For EF <40%, was ACEI/ARB prescribed? - Not Applicable (EF >/=  40%) For EF <40%, Aldosterone Antagonist (Spironolactone or Eplerenone) prescribed? - Not Applicable (EF >/= 40%) Cardiac Rehab Phase II ordered (including medically managed patients)? - Yes   The patient will be scheduled for a TOC follow up appointment with 14 days.  A message has been sent to the [facility] and Scheduling Pool at the office where the patient should be seen for follow up.  _____________  Discharge Vitals Blood pressure (!) 142/66, pulse 64, temperature 98.8 F (37.1 C), temperature source Oral, resp. rate (!) 27, height 5\' 5"  (1.651 m), weight 85.3 kg, SpO2 96%.  Filed Weights   05/09/23 0500 05/10/23 0610 05/11/23 0600  Weight: 84 kg 85.2 kg 85.3 kg    Labs & Radiologic Studies    CBC Recent Labs    05/11/23 0239 05/12/23 0604  WBC 10.9* 9.7  HGB 8.6* 9.1*  HCT 27.3* 28.1*  MCV 86.1 83.9  PLT 219 217   Basic Metabolic Panel Recent Labs    56/21/30 0239 05/12/23 0604  NA 138 140  K 3.7 3.8  CL 107 106  CO2 21* 20*  GLUCOSE 171* 169*  BUN 24* 21  CREATININE 1.89* 1.73*  CALCIUM 8.4* 8.9   Liver Function Tests Recent Labs    05/10/23 0314  AST 14*  ALT 17  ALKPHOS 58  BILITOT 0.6  PROT 4.9*  ALBUMIN 3.0*   No results for input(s): "LIPASE", "AMYLASE" in the last 72 hours. High Sensitivity Troponin:   Recent Labs  Lab 05/03/23 2224 05/04/23 0017  TROPONINIHS 4,223* 3,862*    BNP Invalid input(s): "POCBNP" D-Dimer No results for input(s): "DDIMER" in the last 72 hours. Hemoglobin A1C No results for input(s): "HGBA1C" in the last 72 hours. Fasting Lipid Panel No results for input(s): "CHOL", "HDL", "LDLCALC", "TRIG", "CHOLHDL", "LDLDIRECT" in the last 72 hours. Thyroid Function Tests No results for input(s): "TSH", "T4TOTAL", "T3FREE", "THYROIDAB" in the last 72 hours.  Invalid input(s): "FREET3" _____________  CARDIAC CATHETERIZATION  Result Date: 05/10/2023   Ost LAD to Prox LAD lesion is 80% stenosed.   A drug-eluting stent  was successfully placed using a SYNERGY XD 2.50X16. =>   Ost Cx lesion is 30% stenosed.   Post intervention, there is a 0% residual stenosis.   Anticipated discharge date to be determined. Successful DES PCI of Ostial-Proximal LAD with Synergy XD DES 12.5 mm x 16 mm - deployed to 2.6 mm & prox 3/4 of stent post-dilated to  2.8 mm. Reduced from 80% to 0% with TIMI 3 maintained. RECOMMENDATION Return to ICU for ongoing care with femoral sheath removal-manual pressure.   Patient will need post cath hydration and further titration of GDMT for CAD and admitted with HFmrEF (EF ~45%)   Recommend dual antiplatelet therapy with Aspirin 81mg  daily and Ticagrelor 90mg  twice daily long-term (beyond 12 months) because of Ostial LAD stent.   After 1 year of DAPT with ticagrelor 90 mg twice daily and ASA.   Would reduce to maintenance dose of 60 mg twice daily and discontinue aspirin with plans to continue SAPT out at least through year #2 Bryan Lemma, MD  CARDIAC CATHETERIZATION  Result Date: 05/09/2023   Dist RCA lesion is 60% stenosed.   Prox LAD lesion is 80% stenosed.   Ost Cx lesion is 30% stenosed. High-grade proximal LAD lesion; the patient will receive return for staged PCI depending on creatinine. Moderate distal right coronary artery disease and mild ostial circumflex disease. Fick cardiac output of 5.2 L/min and Fick cardiac index of 2.7 L/min/m with the following hemodynamics: Right atrial pressure mean of 6 mmHg Right ventricular pressure 49/0 with an end-diastolic pressure of 13 mmHg Wedge pressure mean of 15 mmHg with V waves to 20 mmHg Pulmonary artery pressure 49/18 with a mean of 30 mmHg PVR of 2.87 Woods units Pulmonary artery pulsatility index of 5 4.  LVEDP of 34 mmHg. Recommendation: Staged PCI of proximal LAD lesion; resume heparin infusion 2 hours after TR band has been removed.   DG Chest Port 1V same Day  Result Date: 05/07/2023 CLINICAL DATA:  Chest pain EXAM: PORTABLE CHEST 1 VIEW  COMPARISON:  Chest x-ray 04/28/2023 FINDINGS: Strandy opacities in the left lung base are favored is atelectasis. There is no focal lung consolidation, pleural effusion or pneumothorax. The cardiomediastinal silhouette is within normal limits. No acute fractures are seen. IMPRESSION: Strandy opacities in the left lung base are favored is atelectasis. Electronically Signed   By: Darliss Cheney M.D.   On: 05/07/2023 17:09   ABORTED INVASIVE LAB PROCEDURE  Result Date: 05/06/2023 Ms. Mizner presented for right and left heart catheterization for workup of her chest pain and cardiomyopathy.  She was found to be short of breath with increased work of breathing when she first arrived in the Cath Lab but wished to proceed with the procedure.  Shortly after being transferred to the catheterization table, she complained of worsening chest pain and shortness of breath and had to sit up.  Despite this, she continued to exhibit more respiratory distress.  She was moved back to her bed and placed on nonrebreather.  She was also given furosemide with escalating doses of nitroglycerin to treat her uncontrolled hypertension.  Despite these interventions, she continued to be significantly short of breath.  She was placed on BiPAP and given an albuterol nebulizer treatment.  Her procedure was aborted and Ms. Delapena transferred to the ICU for ongoing care.  When she left the cath lab, her breathing was already starting to improve.  She was also chest pain-free.  I spoke with Dr. Tresa Endo, who has been caring for her in the hospital; he will continue to manage her respiratory failure with hypoxia and NSTEMI.  Once her breathing stabilizes, catheterization can be reattempted.  She she is remain on IV heparin with titration of nitroglycerin infusion for blood pressure control and anginal relief. Yvonne Kendall, MD [facility]  DG CHEST PORT 1 VIEW  Result Date: 05/06/2023 CLINICAL DATA:  Respiratory distress. EXAM: PORTABLE  CHEST 1 VIEW COMPARISON:  05/01/2023 FINDINGS: Normal sized heart. Aortic arch atheromatous calcifications. Interval diffuse prominence of the interstitial markings with Kerley lines. Mild left basilar linear and patchy density. Unremarkable bones. IMPRESSION: 1. Interval development of interstitial pulmonary edema. 2. Mild left basilar atelectasis or pneumonia. Electronically Signed   By: Beckie Salts M.D.   On: 05/06/2023 15:32   Disposition   Patient is being discharged home today in good condition.  Follow-up Plans & Appointments     Follow-up Information     Flossie Dibble, NP Follow up.   Specialty: Cardiology Why: Hospital follow-up with Cardiology scheduled for 05/19/2024 at 1:55pm. Please arrive 15 minutes early for check-in. If this date/ time does not work for you, please call our office to rescheudle. Contact information: [address] [place] [address] [phone]                Discharge Instructions     Amb Referral to Cardiac Rehabilitation   Complete by: As directed    Diagnosis:  Coronary Stents NSTEMI     After initial evaluation and assessments completed: Virtual Based Care may be provided alone or in conjunction with Phase 2 Cardiac Rehab based on patient barriers.: Yes   Intensive Cardiac Rehabilitation (ICR) [facility] location only OR Traditional Cardiac Rehabilitation (TCR) *If criteria for ICR are not met will enroll in TCR [facility] only): Yes   Diet - low sodium heart healthy   Complete by: As directed    Increase activity slowly   Complete by: As directed         Discharge Medications   Allergies as of 05/12/2023       Reactions   Advil [ibuprofen] Other (See Comments)   Intolerance due to kidney problem   Aleve [naproxen] Other (See Comments)   Intolerance due to kidney problem        Medication List     STOP taking these medications    pravastatin 40 MG tablet Commonly known as: PRAVACHOL       TAKE these medications     amLODipine 10 MG tablet Commonly known as: NORVASC Take 1 tablet (10 mg total) by mouth daily. Start taking on: May 13, 2023 What changed:  medication strength how much to take   aspirin EC 81 MG tablet Take 81 mg by mouth daily.   atorvastatin 40 MG tablet Commonly known as: LIPITOR Take 1 tablet (40 mg total) by mouth daily. Start taking on: May 13, 2023   Cholecalciferol 125 MCG (5000 UT) capsule Take 5,000 Units by mouth daily.   doxazosin 2 MG tablet Commonly known as: CARDURA Take 2 mg by mouth daily.   fluticasone furoate-vilanterol 100-25 MCG/INH Aepb Commonly known as: BREO ELLIPTA Inhale 1 puff into the lungs daily.   furosemide 40 MG tablet Commonly known as: LASIX Take 40 mg by mouth daily.   glimepiride 2 MG tablet Commonly known as: AMARYL Take 2 mg by mouth daily with breakfast.   hydrALAZINE 50 MG tablet Commonly known as: APRESOLINE Take 1.5 tablets (75 mg total) by mouth 3 (three) times daily. What changed: how much to take   losartan 25 MG tablet Commonly known as: COZAAR Take 1 tablet (25 mg total) by mouth daily. What changed:  medication strength how much to take   metoprolol succinate 50 MG 24 hr tablet Commonly known as: TOPROL-XL Take 50 mg by mouth 2 (two) times daily.   nitroGLYCERIN 0.4 MG  SL tablet Commonly known as: NITROSTAT Place 0.4 mg under the tongue every 5 (five) minutes as needed for chest pain.   omeprazole 40 MG capsule Commonly known as: PRILOSEC Take 40 mg by mouth 2 (two) times daily.   pioglitazone 15 MG tablet Commonly known as: ACTOS Take 15 mg by mouth daily.   potassium chloride 10 MEQ tablet Commonly known as: KLOR-CON Take 10 mEq by mouth 2 (two) times daily.   sodium bicarbonate 650 MG tablet Take 650 mg by mouth daily as needed for heartburn.   ticagrelor 90 MG Tabs tablet Commonly known as: BRILINTA Take 1 tablet (90 mg total) by mouth 2 (two) times daily.            Outstanding Labs/Studies   Repeat BMET and CBC at follow-up visit. Repeat lipid panel and LFTs in 6-8 weeks.  Duration of Discharge Encounter   Greater than 30 minutes including physician time.  Signed, Corrin Parker, PA-C 05/12/2023, 12:04 PM  Cardiology Attending  Patient seen and examined. See my rounding note for additional details. She is stable for dc home. Usual followup as noted above.  Sharlot Gowda Marjan Rosman,MD

## 2023-05-11 NOTE — Plan of Care (Signed)
  Problem: Education: Goal: Knowledge of General Education information will improve Description: Including pain rating scale, medication(s)/side effects and non-pharmacologic comfort measures Outcome: Progressing   Problem: Health Behavior/Discharge Planning: Goal: Ability to manage health-related needs will improve Outcome: Progressing   Problem: Clinical Measurements: Goal: Ability to maintain clinical measurements within normal limits will improve Outcome: Progressing Goal: Will remain free from infection Outcome: Progressing Goal: Diagnostic test results will improve Outcome: Progressing Goal: Respiratory complications will improve Outcome: Progressing Goal: Cardiovascular complication will be avoided Outcome: Progressing   Problem: Activity: Goal: Risk for activity intolerance will decrease Outcome: Progressing   Problem: Nutrition: Goal: Adequate nutrition will be maintained Outcome: Progressing   Problem: Coping: Goal: Level of anxiety will decrease Outcome: Progressing   Problem: Elimination: Goal: Will not experience complications related to bowel motility Outcome: Progressing Goal: Will not experience complications related to urinary retention Outcome: Progressing   Problem: Pain Management: Goal: General experience of comfort will improve Outcome: Progressing   Problem: Safety: Goal: Ability to remain free from injury will improve Outcome: Progressing   Problem: Skin Integrity: Goal: Risk for impaired skin integrity will decrease Outcome: Progressing   Problem: Education: Goal: Ability to describe self-care measures that may prevent or decrease complications (Diabetes Survival Skills Education) will improve Outcome: Progressing Goal: Individualized Educational Video(s) Outcome: Progressing   Problem: Coping: Goal: Ability to adjust to condition or change in health will improve Outcome: Progressing   Problem: Fluid Volume: Goal: Ability to  maintain a balanced intake and output will improve Outcome: Progressing   Problem: Health Behavior/Discharge Planning: Goal: Ability to identify and utilize available resources and services will improve Outcome: Progressing Goal: Ability to manage health-related needs will improve Outcome: Progressing   Problem: Metabolic: Goal: Ability to maintain appropriate glucose levels will improve Outcome: Progressing   Problem: Nutritional: Goal: Maintenance of adequate nutrition will improve Outcome: Progressing Goal: Progress toward achieving an optimal weight will improve Outcome: Progressing   Problem: Skin Integrity: Goal: Risk for impaired skin integrity will decrease Outcome: Progressing   Problem: Tissue Perfusion: Goal: Adequacy of tissue perfusion will improve Outcome: Progressing   Problem: Education: Goal: Understanding of cardiac disease, CV risk reduction, and recovery process will improve Outcome: Progressing Goal: Individualized Educational Video(s) Outcome: Progressing   Problem: Activity: Goal: Ability to tolerate increased activity will improve Outcome: Progressing   Problem: Cardiac: Goal: Ability to achieve and maintain adequate cardiovascular perfusion will improve Outcome: Progressing   Problem: Health Behavior/Discharge Planning: Goal: Ability to safely manage health-related needs after discharge will improve Outcome: Progressing   Problem: Education: Goal: Understanding of CV disease, CV risk reduction, and recovery process will improve Outcome: Progressing Goal: Individualized Educational Video(s) Outcome: Progressing   Problem: Activity: Goal: Ability to return to baseline activity level will improve Outcome: Progressing   Problem: Cardiovascular: Goal: Ability to achieve and maintain adequate cardiovascular perfusion will improve Outcome: Progressing Goal: Vascular access site(s) Level 0-1 will be maintained Outcome: Progressing    Problem: Health Behavior/Discharge Planning: Goal: Ability to safely manage health-related needs after discharge will improve Outcome: Progressing

## 2023-05-11 NOTE — Progress Notes (Signed)
CARDIAC REHAB PHASE I   Pt sitting in chair, feeling well today. Ongoing wean of O2 and NTG drip. PT ordered for mobility later today. Post MI/stent education including restrictions, risk factors, exercise guidelines, antiplatelet therapy importance, MI booklet, NTG use, heart healthy diabetic diet and CRP2 reviewed. All questions and concerns addressed. Will refer to [place] for CRP2.   [id number] Woodroe Chen, RN BSN 05/11/2023 9:12 AM

## 2023-05-11 NOTE — Discharge Instructions (Signed)
Post NSTEMI: NO HEAVY LIFTING X 2 WEEKS. NO SEXUAL ACTIVITY X 2 WEEKS. NO DRIVING X 1 WEEK. NO SOAKING BATHS, HOT TUBS, POOLS, ETC., X 7 DAYS.   Radial Site Care Refer to this sheet in the next few weeks. These instructions provide you with information on caring for yourself after your procedure. Your caregiver may also give you more specific instructions. Your treatment has been planned according to current medical practices, but problems sometimes occur. Call your caregiver if you have any problems or questions after your procedure. HOME CARE INSTRUCTIONS You may shower the day after the procedure. Remove the bandage (dressing) and gently wash the site with plain soap and water. Gently pat the site dry.  Do not apply powder or lotion to the site.  Do not submerge the affected site in water for 3 to 5 days.  Inspect the site at least twice daily.  Do not flex or bend the affected arm for 24 hours.  No lifting over 5 pounds (2.3 kg) for 5 days after your procedure.  Do not drive home if you are discharged the same day of the procedure. Have someone else drive you.  What to expect: Any bruising will usually fade within 1 to 2 weeks.  Blood that collects in the tissue (hematoma) may be painful to the touch. It should usually decrease in size and tenderness within 1 to 2 weeks.  SEEK IMMEDIATE MEDICAL CARE IF: You have unusual pain at the radial site.  You have redness, warmth, swelling, or pain at the radial site.  You have drainage (other than a small amount of blood on the dressing).  You have chills.  You have a fever or persistent symptoms for more than 72 hours.  You have a fever and your symptoms suddenly get worse.  Your arm becomes pale, cool, tingly, or numb.  You have heavy bleeding from the site. Hold pressure on the site.    Groin Site Care: Refer to this sheet in the next few weeks. These instructions provide you with information on caring for yourself after your procedure.  Your caregiver may also give you more specific instructions. Your treatment has been planned according to current medical practices, but problems sometimes occur. Call your caregiver if you have any problems or questions after your procedure. HOME CARE INSTRUCTIONS You may shower 24 hours after the procedure. Remove the bandage (dressing) and gently wash the site with plain soap and water. Gently pat the site dry.  Do not apply powder or lotion to the site.  Do not sit in a bathtub, swimming pool, or whirlpool for 5 to 7 days.  No bending, squatting, or lifting anything over 10 pounds (4.5 kg) as directed by your caregiver.  Inspect the site at least twice daily.  Do not drive home if you are discharged the same day of the procedure. Have someone else drive you.  What to expect: Any bruising will usually fade within 1 to 2 weeks.  Blood that collects in the tissue (hematoma) may be painful to the touch. It should usually decrease in size and tenderness within 1 to 2 weeks.  SEEK IMMEDIATE MEDICAL CARE IF: You have unusual pain at the groin site or down the affected leg.  You have redness, warmth, swelling, or pain at the groin site.  You have drainage (other than a small amount of blood on the dressing).  You have chills.  You have a fever or persistent symptoms for more than 72 hours.  You have a fever and your symptoms suddenly get worse.  Your leg becomes pale, cool, tingly, or numb.  You have heavy bleeding from the site. Hold pressure on the site.

## 2023-05-12 ENCOUNTER — Telehealth: Payer: Self-pay | Admitting: Student

## 2023-05-12 DIAGNOSIS — I214 Non-ST elevation (NSTEMI) myocardial infarction: Secondary | ICD-10-CM | POA: Diagnosis not present

## 2023-05-12 LAB — GLUCOSE, CAPILLARY
Glucose-Capillary: 196 mg/dL — ABNORMAL HIGH (ref 70–99)
Glucose-Capillary: 149 mg/dL — ABNORMAL HIGH (ref 70–99)

## 2023-05-12 LAB — CBC
HCT: 28.1 % — ABNORMAL LOW (ref 36.0–46.0)
Hemoglobin: 9.1 g/dL — ABNORMAL LOW (ref 12.0–15.0)
MCH: 27.2 pg (ref 26.0–34.0)
MCHC: 32.4 g/dL (ref 30.0–36.0)
MCV: 83.9 fL (ref 80.0–100.0)
Platelets: 217 10*3/uL (ref 150–400)
RBC: 3.35 MIL/uL — ABNORMAL LOW (ref 3.87–5.11)
RDW: 14.7 % (ref 11.5–15.5)
WBC: 9.7 10*3/uL (ref 4.0–10.5)
nRBC: 0 % (ref 0.0–0.2)

## 2023-05-12 LAB — BASIC METABOLIC PANEL
Anion gap: 14 (ref 5–15)
BUN: 21 mg/dL (ref 8–23)
CO2: 20 mmol/L — ABNORMAL LOW (ref 22–32)
Calcium: 8.9 mg/dL (ref 8.9–10.3)
Chloride: 106 mmol/L (ref 98–111)
Creatinine, Ser: 1.73 mg/dL — ABNORMAL HIGH (ref 0.44–1.00)
GFR, Estimated: 30 mL/min — ABNORMAL LOW (ref >60)
Glucose, Bld: 169 mg/dL — ABNORMAL HIGH (ref 70–99)
Potassium: 3.8 mmol/L (ref 3.5–5.1)
Sodium: 140 mmol/L (ref 135–145)

## 2023-05-12 MED ORDER — ATORVASTATIN CALCIUM 40 MG PO TABS: 40.0000 mg | ORAL_TABLET | Freq: Every day | ORAL | 1 refills | Status: DC

## 2023-05-12 MED ORDER — LOSARTAN POTASSIUM 25 MG PO TABS: 25.0000 mg | ORAL_TABLET | Freq: Every day | ORAL | 0 refills | Status: DC

## 2023-05-12 MED ORDER — TICAGRELOR 90 MG PO TABS: 90.0000 mg | ORAL_TABLET | Freq: Two times a day (BID) | ORAL | 3 refills | Status: DC

## 2023-05-12 MED ORDER — HYDRALAZINE HCL 50 MG PO TABS: 75.0000 mg | ORAL_TABLET | Freq: Three times a day (TID) | ORAL | 1 refills | Status: DC

## 2023-05-12 MED ORDER — LOSARTAN POTASSIUM 25 MG PO TABS: 25.0000 mg | ORAL_TABLET | Freq: Every day | ORAL | 1 refills | Status: DC

## 2023-05-12 MED ORDER — ATORVASTATIN CALCIUM 40 MG PO TABS: 40.0000 mg | ORAL_TABLET | Freq: Every day | ORAL | 0 refills | Status: DC

## 2023-05-12 MED ORDER — AMLODIPINE BESYLATE 10 MG PO TABS: 10.0000 mg | ORAL_TABLET | Freq: Every day | ORAL | 1 refills | Status: DC

## 2023-05-12 MED ORDER — TICAGRELOR 90 MG PO TABS: 90.0000 mg | ORAL_TABLET | Freq: Two times a day (BID) | ORAL | 0 refills | Status: DC

## 2023-05-12 MED FILL — Nitroglycerin SL Tab 0.4 MG: SUBLINGUAL | Qty: 1 | Status: AC

## 2023-05-12 MED FILL — Verapamil HCl IV Soln 2.5 MG/ML: INTRAVENOUS | Qty: 2 | Status: AC

## 2023-05-12 NOTE — Progress Notes (Signed)
Pt not in distress. Bipap not needed at this time. Will continue to monitor.

## 2023-05-12 NOTE — Telephone Encounter (Signed)
   Transition of Care Follow-up Phone Call Request    Patient Name: Amber [name] Freda Date of Birth: 04-Dec-1945 Date of Encounter: 05/12/2023  Primary Care Provider:  Abner Greenspan, MD Primary Cardiologist:  Marlyn Corporal Madireddy, MD  Cheri Guppy Bankhead has been scheduled for a transition of care follow up appointment with a [facility] provider:  [name], NP, on 05/20/2023 at 1:55pm.  Please reach out to [facility] within 48 hours of discharge to confirm appointment and review transition of care protocol questionnaire. Anticipated discharge date: 05/12/2023.   Corrin Parker, PA-C  05/12/2023, 12:05 PM

## 2023-05-12 NOTE — Progress Notes (Signed)
Progress Note  Patient Name: Amber [name] Date of Encounter: 05/12/2023  Primary Cardiologist: Marlyn Corporal Madireddy, MD   Subjective   No chest pain or sob. Wants to go home.  Inpatient Medications    Scheduled Meds:  amLODipine  10 mg Oral Daily   aspirin EC  81 mg Oral Daily   atorvastatin  40 mg Oral Daily   Chlorhexidine Gluconate Cloth  6 each Topical Daily   cholecalciferol  5,000 Units Oral Daily   fluticasone furoate-vilanterol  1 puff Inhalation Daily   heparin  5,000 Units Subcutaneous Q8H   hydrALAZINE  75 mg Oral Q8H   insulin aspart  0-15 Units Subcutaneous TID WC   metoprolol succinate  50 mg Oral BID   pantoprazole  40 mg Oral Daily   sodium chloride flush  3 mL Intravenous Q12H   sodium chloride flush  3 mL Intravenous Q12H   ticagrelor  90 mg Oral BID   Continuous Infusions:  PRN Meds: acetaminophen, ipratropium-albuterol, nitroGLYCERIN, ondansetron (ZOFRAN) IV, mouth rinse, sodium chloride flush, sodium chloride flush   Vital Signs    Vitals:   05/12/23 0517 05/12/23 0600 05/12/23 0700 05/12/23 0800  BP: (!) 147/70 139/68 (!) 140/71 132/75  Pulse:  73 72 78  Resp:  17 19 19   Temp:      TempSrc:      SpO2:  97% 98% 98%  Weight:      Height:        Intake/Output Summary (Last 24 hours) at 05/12/2023 0936 Last data filed at 05/12/2023 0730 Gross per 24 hour  Intake 491.42 ml  Output --  Net 491.42 ml   Filed Weights   05/09/23 0500 05/10/23 0610 05/11/23 0600  Weight: 84 kg 85.2 kg 85.3 kg    Telemetry    Nsr with PVC's - Personally Reviewed  ECG    none - Personally Reviewed  Physical Exam   GEN: No acute distress.   Neck: No JVD Cardiac: RRR, no murmurs, rubs, or gallops.  Respiratory: Clear to auscultation bilaterally. GI: Soft, nontender, non-distended  MS: No edema; No deformity. Neuro:  Nonfocal  Psych: Normal affect   Labs    Chemistry Recent Labs  Lab 05/10/23 0314 05/10/23 1439 05/11/23 0239  05/12/23 0604  NA 139  --  138 140  K 3.9  --  3.7 3.8  CL 107  --  107 106  CO2 23  --  21* 20*  GLUCOSE 128*  --  171* 169*  BUN 29*  --  24* 21  CREATININE 1.73* 1.58* 1.89* 1.73*  CALCIUM 8.5*  --  8.4* 8.9  PROT 4.9*  --   --   --   ALBUMIN 3.0*  --   --   --   AST 14*  --   --   --   ALT 17  --   --   --   ALKPHOS 58  --   --   --   BILITOT 0.6  --   --   --   GFRNONAA 30* 33* 27* 30*  ANIONGAP 9  --  10 14     Hematology Recent Labs  Lab 05/10/23 1439 05/11/23 0239 05/12/23 0604  WBC 9.8 10.9* 9.7  RBC 3.24* 3.17* 3.35*  HGB 8.6* 8.6* 9.1*  HCT 27.0* 27.3* 28.1*  MCV 83.3 86.1 83.9  MCH 26.5 27.1 27.2  MCHC 31.9 31.5 32.4  RDW 14.4 14.6 14.7  PLT 227 219 217  Cardiac EnzymesNo results for input(s): "TROPONINI" in the last 168 hours. No results for input(s): "TROPIPOC" in the last 168 hours.   BNP Recent Labs  Lab 05/06/23 0639 05/09/23 0259  BNP 1,419.4* 836.4*     DDimer No results for input(s): "DDIMER" in the last 168 hours.   Radiology    CARDIAC CATHETERIZATION  Result Date: 05/10/2023   Ost LAD to Prox LAD lesion is 80% stenosed.   A drug-eluting stent was successfully placed using a SYNERGY XD 2.50X16. =>   Ost Cx lesion is 30% stenosed.   Post intervention, there is a 0% residual stenosis.   Anticipated discharge date to be determined. Successful DES PCI of Ostial-Proximal LAD with Synergy XD DES 12.5 mm x 16 mm - deployed to 2.6 mm & prox 3/4 of stent post-dilated to 2.8 mm. Reduced from 80% to 0% with TIMI 3 maintained. RECOMMENDATION Return to ICU for ongoing care with femoral sheath removal-manual pressure.   Patient will need post cath hydration and further titration of GDMT for CAD and admitted with HFmrEF (EF ~45%)   Recommend dual antiplatelet therapy with Aspirin 81mg  daily and Ticagrelor 90mg  twice daily long-term (beyond 12 months) because of Ostial LAD stent.   After 1 year of DAPT with ticagrelor 90 mg twice daily and ASA.   Would  reduce to maintenance dose of 60 mg twice daily and discontinue aspirin with plans to continue SAPT out at least through year #2 Bryan Lemma, MD   Cardiac Studies   See above  Patient Profile     77 y.o. female admitted with NSTEMI, s/p PCI  Assessment & Plan    NSTEMI - she is pain free and doing well. She is off of NTG and no anginal symptoms. She can be discharged home on current medical therapy. Stage 3 chronic renal insufficiency - her creatinine is down to 1.7 from 1.9 after her heart cath. Ok for DC home.  [facility] will sign off.   Medication Recommendations:  continue current meds Other recommendations (labs, testing, etc):  none Follow up as an outpatient:  followup with Dr. Bing Matter in the [facility] office in 3-4 weeks.   For questions or updates, please contact [facility] Please consult [email or web address] for contact info under Cardiology/STEMI.      Signed, Lewayne Bunting, MD  05/12/2023, 9:36 AM

## 2023-05-13 ENCOUNTER — Encounter (HOSPITAL_COMMUNITY): Payer: Self-pay | Admitting: Cardiology

## 2023-05-13 NOTE — Telephone Encounter (Signed)
Patient contacted regarding discharge from [facility] on 05/12/23.  Patient understands to follow up with provider J. Elliot Gurney, NP on 05/20/23 at 1:55 at [facility] . Patient understands discharge instructions? Yes Patient understands medications and regiment? Yes Patient understands to bring all medications to this visit? Yes  Ask patient:  Are you enrolled in My Chart No and NO

## 2023-05-13 NOTE — Telephone Encounter (Signed)
Left vm to return call for TOC.  Dc'd from [facility] 05/12/23 FU with Anson [place], NP 05/20/23 at 1:55 in [place]

## 2023-05-15 ENCOUNTER — Telehealth (HOSPITAL_COMMUNITY): Payer: Self-pay

## 2023-05-15 NOTE — Telephone Encounter (Signed)
Per phase 1 Cardiac Rehab fax referral to [facility]

## 2023-05-19 NOTE — Progress Notes (Signed)
 Cardiology Office Note:  .   Date:  05/20/2023  ID:  Amber [name], DOB 04-29-1946, MRN [id number] PCP: Abner Greenspan, MD  [facility] Providers Cardiologist:  Marlyn Corporal Madireddy, MD    History of Present Illness: .   Amber [name] is a 77 y.o. female with a past medical history of hypertension, CAD, HFrEF, COPD, GERD, DM 2, CKD stage IV, history of breast cancer, dyslipidemia.  05/10/2023 coronary stent intervention DES PCI of ostial-proximal LAD 05/09/2023 left heart cath high-grade proximal LAD lesion; still RCA 60% stenosed, 30% stenosis of ostial circumflex 05/02/2023 echo EF 45 to 50% mild MR mildly sclerotic aortic valve without stenosis RVSP mildly elevated 39 mmHg, PASP 39 mmHg 2019 nonobstructive CAD per left heart cath  Was evaluated in office on 05/01/2023 with Dr. Vincent Gros with complaints of chest pain, she also has some DOE, with an unintentional 20 pound weight gain >> [facility] ED where she was noted to be hypertensive with her systolic blood pressure in the 200s, troponin 1 was negative x 5, echo showed EF 45 to 50% with hypokinesis of apical septal segment, Myoview showed reversible ischemia suggesting possible LAD occlusion >> [facility].  EKG showed mildly elevated ST changes in V2, high sensitive troponin peaked at 4223 ruling in for non-STEMI she was taken to the Cath Lab on 05/06/2023 however the case had to be canceled secondary to acute respiratory failure and heart failure exacerbation requiring BiPAP and IV Lasix.  She returned to the Cath Lab on 05/09/2023 which revealed 80% stenosis of proximal LAD, 60% stenosis of distal RCA, 30% stenosis of ostial left circumflex >> staged PCI on 05/10/2023 with DES to ostial and proximal LAD, started on DAPT with aspirin and Brilinta.  She presents today accompanied by her husband for follow-up after recent hospitalization as outlined above.  She states she has not had any recurrent episodes of chest  pain, she continues to be bothered by shortness of breath-as she states she feels her breathing is better however has been politely disagrees.   ROS: Review of Systems  Constitutional:  Positive for malaise/fatigue.  Respiratory:  Positive for shortness of breath.   Endo/Heme/Allergies:  Bruises/bleeds easily.  All other systems reviewed and are negative.    Studies Reviewed: .        Cardiac Studies & Procedures   CARDIAC CATHETERIZATION  CARDIAC CATHETERIZATION 05/10/2023  Narrative   Ost LAD to Prox LAD lesion is 80% stenosed.   A drug-eluting stent was successfully placed using a SYNERGY XD 2.50X16. =>   Ost Cx lesion is 30% stenosed.   Post intervention, there is a 0% residual stenosis.   Anticipated discharge date to be determined.  Successful DES PCI of Ostial-Proximal LAD with Synergy XD DES 12.5 mm x 16 mm - deployed to 2.6 mm & prox 3/4 of stent post-dilated to 2.8 mm. Reduced from 80% to 0% with TIMI 3 maintained.   RECOMMENDATION Return to ICU for ongoing care with femoral sheath removal-manual pressure.   Patient will need post cath hydration and further titration of GDMT for CAD and admitted with HFmrEF (EF ~45%)   Recommend dual antiplatelet therapy with Aspirin 81mg  daily and Ticagrelor 90mg  twice daily long-term (beyond 12 months) because of Ostial LAD stent.   After 1 year of DAPT with ticagrelor 90 mg twice daily and ASA.   Would reduce to maintenance dose of 60 mg twice daily and discontinue aspirin with plans to continue SAPT out at least  through year #2     Bryan Lemma, MD  Findings Coronary Findings Diagnostic  Dominance: Right  Left Main Vessel was injected. Vessel is normal in caliber.  Left Anterior Descending Ost LAD to Prox LAD lesion is 80% stenosed. Vessel is the culprit lesion. The lesion is type B1, distal to major branch, focal, irregular and ulcerative. The lesion was not previously treated .  First Diagonal Branch Vessel is small in  size.  First Septal Branch Vessel is small in size.  Left Circumflex Vessel is large. Ost Cx lesion is 30% stenosed.  Right Coronary Artery The vessel exhibits minimal luminal irregularities. Dist RCA lesion is 60% stenosed.  Right Posterior Descending Artery The vessel exhibits minimal luminal irregularities.  Intervention  Ost LAD to Prox LAD lesion Stent Lesion length:  15 mm. CATH VISTA GUIDE 6FR XBLAD3.5 guide catheter was inserted. Lesion crossed with guidewire using a WIRE ASAHI PROWATER 180CM. Pre-stent angioplasty was performed using a BALLN EMERGE MR 2.5X12. Maximum pressure:  8 atm. Inflation time:  12 sec. A drug-eluting stent was successfully placed using a SYNERGY XD 2.50X16. Maximum pressure: 14 atm. Inflation time: 30 sec. Stent strut is well apposed. Deployed to 2.6 mm with the proximal 12 mm postdilated to 2.8 mm Post-stent angioplasty was performed using a BALL SAPPHIRE NC24 2.75X12. Maximum pressure:  14 atm. Inflation time:  30 sec. Proximal 12 mm of the stent Post-Intervention Lesion Assessment The intervention was successful. Pre-interventional TIMI flow is 3. Post-intervention TIMI flow is 3. Treated lesion length:  16 mm. No complications occurred at this lesion. There is a 0% residual stenosis post intervention.   CARDIAC CATHETERIZATION 05/09/2023  Narrative   Dist RCA lesion is 60% stenosed.   Prox LAD lesion is 80% stenosed.   Ost Cx lesion is 30% stenosed.  High-grade proximal LAD lesion; the patient will receive return for staged PCI depending on creatinine. Moderate distal right coronary artery disease and mild ostial circumflex disease. Fick cardiac output of 5.2 L/min and Fick cardiac index of 2.7 L/min/m with the following hemodynamics: Right atrial pressure mean of 6 mmHg Right ventricular pressure 49/0 with an end-diastolic pressure of 13 mmHg Wedge pressure mean of 15 mmHg with V waves to 20 mmHg Pulmonary artery pressure 49/18 with a mean  of 30 mmHg PVR of 2.87 Woods units Pulmonary artery pulsatility index of 5 4.  LVEDP of 34 mmHg.  Recommendation: Staged PCI of proximal LAD lesion; resume heparin infusion 2 hours after TR band has been removed.  Findings Coronary Findings Diagnostic  Dominance: Right  Left Anterior Descending Prox LAD lesion is 80% stenosed.  Left Circumflex Ost Cx lesion is 30% stenosed.  Right Coronary Artery The vessel exhibits minimal luminal irregularities. Dist RCA lesion is 60% stenosed.  Right Posterior Descending Artery The vessel exhibits minimal luminal irregularities.  Intervention  No interventions have been documented.                Risk Assessment/Calculations:     HYPERTENSION CONTROL Vitals:   05/20/23 1345 05/20/23 1625  BP: (!) 156/69 (!) 156/59    The patient's blood pressure is elevated above target today.  In order to address the patient's elevated BP: Blood pressure will be monitored at home to determine if medication changes need to be made.          Physical Exam:   VS:  BP (!) 156/59   Pulse 70   Ht 5\' 5"  (1.651 m)   Wt 186 lb (  84.4 kg)   SpO2 97%   BMI 30.95 kg/m    Wt Readings from Last 3 Encounters:  05/20/23 186 lb (84.4 kg)  05/11/23 188 lb 0.8 oz (85.3 kg)  05/01/23 188 lb (85.3 kg)    GEN: Ill-appearing, no acute distress NECK: No JVD; No carotid bruits CARDIAC: RRR, no murmurs, rubs, gallops RESPIRATORY:  Clear to auscultation without rales, wheezing or rhonchi  ABDOMEN: Soft, non-tender, non-distended EXTREMITIES:  No edema; No deformity  SKIN: Right groin cath site healing well  ASSESSMENT AND PLAN: .   CAD- left heart cath high-grade proximal LAD lesion; still RCA 60% stenosed, 30% stenosis of ostial circumflex >> DES to pLAD. Stable with no anginal symptoms. No indication for ischemic evaluation.  Continue aspirin 81 mg daily, continue Brilinta 90 mg twice daily, continue Lipitor 40 mg daily, continue metoprolol 50 mg twice  daily, continue nitroglycerin as needed.  We discussed cardiac rehab however she politely declines at this time.  HFmrEF -NYHA class II-III, euvolemic--hard to tell if her breathing is secondary to COPD.  GDMT is prohibited secondary to kidney dysfunction.  Continue Lasix 40 mg daily, continue metoprolol 50 mg twice daily, continue losartan 25 mg daily.  We could consider SGLT2 inhibitor in the future however she currently has issues with stress incontinence and this may not be the best choice for her.  Will check proBNP today.  DOE/COPD-this been bothersome for her for some time, she states she feels her breathing is about baseline however husband politely disagrees.  She has a history of COPD however she says it is a mild case, typically follows with her PCP for management of this.  I am going to refer her to pulmonology for further evaluation.  Dyslipidemia-most recent LDL was 66, she was transition from pravastatin to Lipitor, will repeat FLP and LFTs in 6 weeks.      Dispo: CBC, BMET, proBNP today.  Repeat lipid panel and LFTs in 6 weeks.  Ambulatory referral to pulmonology.  Follow-up with Dr. Vincent Gros in 2 weeks.  Signed, Flossie Dibble, NP

## 2023-05-20 ENCOUNTER — Encounter: Payer: Self-pay | Admitting: Cardiology

## 2023-05-20 ENCOUNTER — Ambulatory Visit: Payer: Medicare HMO | Attending: Cardiology | Admitting: Cardiology

## 2023-05-20 VITALS — BP 156/59 | HR 70 | Ht 65.0 in | Wt 186.0 lb

## 2023-05-20 DIAGNOSIS — E785 Hyperlipidemia, unspecified: Secondary | ICD-10-CM

## 2023-05-20 DIAGNOSIS — I2510 Atherosclerotic heart disease of native coronary artery without angina pectoris: Secondary | ICD-10-CM | POA: Diagnosis not present

## 2023-05-20 DIAGNOSIS — Z79899 Other long term (current) drug therapy: Secondary | ICD-10-CM

## 2023-05-20 DIAGNOSIS — J449 Chronic obstructive pulmonary disease, unspecified: Secondary | ICD-10-CM | POA: Diagnosis not present

## 2023-05-20 DIAGNOSIS — I10 Essential (primary) hypertension: Secondary | ICD-10-CM | POA: Diagnosis not present

## 2023-05-20 DIAGNOSIS — R0602 Shortness of breath: Secondary | ICD-10-CM | POA: Diagnosis not present

## 2023-05-20 DIAGNOSIS — I5020 Unspecified systolic (congestive) heart failure: Secondary | ICD-10-CM | POA: Diagnosis not present

## 2023-05-20 NOTE — Patient Instructions (Signed)
Medication Instructions:  Your physician recommends that you continue on your current medications as directed. Please refer to the Current Medication list given to you today.  *If you need a refill on your cardiac medications before your next appointment, please call your pharmacy*   Lab Work: Your physician recommends that you return for lab work in: Today for BMP and CBC Come back in 6 weeks (12/23) for Fasting Lipid Panel and Liver Function Panel Lab opens at 8am. You DO NOT NEED an appointment. Best time to come is between 8am and 11:45 and between 1:30 and 4:30. If you have been asked to fast for your blood work please have nothing to eat or drink after midnight. You may have water.   If you have labs (blood work) drawn today and your tests are completely normal, you will receive your results only by: MyChart Message (if you have MyChart) OR A paper copy in the mail If you have any lab test that is abnormal or we need to change your treatment, we will call you to review the results.   Testing/Procedures: NONE   Follow-Up: At [facility], you and your health needs are our priority.  As part of our continuing mission to provide you with exceptional heart care, we have created designated Provider Care Teams.  These Care Teams include your primary Cardiologist (physician) and Advanced Practice Providers (APPs -  Physician Assistants and Nurse Practitioners) who all work together to provide you with the care you need, when you need it.  We recommend signing up for the patient portal called "MyChart".  Sign up information is provided on this After Visit Summary.  MyChart is used to connect with patients for Virtual Visits (Telemedicine).  Patients are able to view lab/test results, encounter notes, upcoming appointments, etc.  Non-urgent messages can be sent to your provider as well.   To learn more about what you can do with MyChart, go to [email or web address].    Your next  appointment:   2 week(s)  Provider:   Huntley Dec, MD    Other Instructions

## 2023-05-22 DIAGNOSIS — J449 Chronic obstructive pulmonary disease, unspecified: Secondary | ICD-10-CM | POA: Diagnosis not present

## 2023-05-22 DIAGNOSIS — I2510 Atherosclerotic heart disease of native coronary artery without angina pectoris: Secondary | ICD-10-CM | POA: Diagnosis not present

## 2023-05-22 DIAGNOSIS — I10 Essential (primary) hypertension: Secondary | ICD-10-CM | POA: Diagnosis not present

## 2023-05-22 DIAGNOSIS — I5020 Unspecified systolic (congestive) heart failure: Secondary | ICD-10-CM | POA: Diagnosis not present

## 2023-05-22 DIAGNOSIS — R0602 Shortness of breath: Secondary | ICD-10-CM | POA: Diagnosis not present

## 2023-05-22 DIAGNOSIS — Z79899 Other long term (current) drug therapy: Secondary | ICD-10-CM | POA: Diagnosis not present

## 2023-05-22 DIAGNOSIS — E785 Hyperlipidemia, unspecified: Secondary | ICD-10-CM | POA: Diagnosis not present

## 2023-05-23 ENCOUNTER — Telehealth: Payer: Self-pay

## 2023-05-23 LAB — LIPID PANEL
Chol/HDL Ratio: 3.3 ratio (ref 0.0–4.4)
Cholesterol, Total: 114 mg/dL (ref 100–199)
HDL: 35 mg/dL — ABNORMAL LOW
LDL Chol Calc (NIH): 57 mg/dL (ref 0–99)
Triglycerides: 120 mg/dL (ref 0–149)
VLDL Cholesterol Cal: 22 mg/dL (ref 5–40)

## 2023-05-23 LAB — HEPATIC FUNCTION PANEL
ALT: 13 IU/L (ref 0–32)
AST: 12 IU/L (ref 0–40)
Albumin: 4 g/dL (ref 3.8–4.8)
Alkaline Phosphatase: 95 IU/L (ref 44–121)
Bilirubin Total: 0.6 mg/dL (ref 0.0–1.2)
Bilirubin, Direct: 0.2 mg/dL (ref 0.00–0.40)
Total Protein: 5.9 g/dL — ABNORMAL LOW (ref 6.0–8.5)

## 2023-05-23 NOTE — Telephone Encounter (Signed)
-----   Message from Flossie Dibble sent at 05/23/2023  8:12 AM EST ----- Cholesterol looks good.   Continue with current meds.

## 2023-05-23 NOTE — Telephone Encounter (Signed)
Patient notified of results and verbalized understanding.  

## 2023-05-27 DIAGNOSIS — U071 COVID-19: Secondary | ICD-10-CM | POA: Diagnosis not present

## 2023-05-27 DIAGNOSIS — Z7689 Persons encountering health services in other specified circumstances: Secondary | ICD-10-CM | POA: Diagnosis not present

## 2023-05-27 DIAGNOSIS — I214 Non-ST elevation (NSTEMI) myocardial infarction: Secondary | ICD-10-CM | POA: Diagnosis not present

## 2023-05-27 DIAGNOSIS — I25119 Atherosclerotic heart disease of native coronary artery with unspecified angina pectoris: Secondary | ICD-10-CM | POA: Diagnosis not present

## 2023-05-27 DIAGNOSIS — Z20822 Contact with and (suspected) exposure to covid-19: Secondary | ICD-10-CM | POA: Diagnosis not present

## 2023-05-27 DIAGNOSIS — R509 Fever, unspecified: Secondary | ICD-10-CM | POA: Diagnosis not present

## 2023-05-27 DIAGNOSIS — Z6830 Body mass index (BMI) 30.0-30.9, adult: Secondary | ICD-10-CM | POA: Diagnosis not present

## 2023-06-03 ENCOUNTER — Other Ambulatory Visit: Payer: Self-pay

## 2023-06-03 DIAGNOSIS — U071 COVID-19: Secondary | ICD-10-CM | POA: Diagnosis not present

## 2023-06-03 DIAGNOSIS — E119 Type 2 diabetes mellitus without complications: Secondary | ICD-10-CM | POA: Insufficient documentation

## 2023-06-03 DIAGNOSIS — J441 Chronic obstructive pulmonary disease with (acute) exacerbation: Secondary | ICD-10-CM | POA: Diagnosis not present

## 2023-06-03 DIAGNOSIS — N189 Chronic kidney disease, unspecified: Secondary | ICD-10-CM | POA: Insufficient documentation

## 2023-06-03 DIAGNOSIS — I10 Essential (primary) hypertension: Secondary | ICD-10-CM | POA: Insufficient documentation

## 2023-06-04 ENCOUNTER — Ambulatory Visit: Payer: Medicare HMO

## 2023-06-08 ENCOUNTER — Other Ambulatory Visit: Payer: Self-pay | Admitting: Student

## 2023-06-09 DIAGNOSIS — E1121 Type 2 diabetes mellitus with diabetic nephropathy: Secondary | ICD-10-CM | POA: Diagnosis not present

## 2023-06-09 DIAGNOSIS — E669 Obesity, unspecified: Secondary | ICD-10-CM | POA: Diagnosis not present

## 2023-06-12 DIAGNOSIS — N184 Chronic kidney disease, stage 4 (severe): Secondary | ICD-10-CM | POA: Diagnosis not present

## 2023-06-13 DIAGNOSIS — E1122 Type 2 diabetes mellitus with diabetic chronic kidney disease: Secondary | ICD-10-CM | POA: Diagnosis not present

## 2023-06-13 DIAGNOSIS — I5020 Unspecified systolic (congestive) heart failure: Secondary | ICD-10-CM | POA: Diagnosis not present

## 2023-06-13 DIAGNOSIS — Z6829 Body mass index (BMI) 29.0-29.9, adult: Secondary | ICD-10-CM | POA: Diagnosis not present

## 2023-06-13 DIAGNOSIS — R609 Edema, unspecified: Secondary | ICD-10-CM | POA: Diagnosis not present

## 2023-06-13 DIAGNOSIS — I129 Hypertensive chronic kidney disease with stage 1 through stage 4 chronic kidney disease, or unspecified chronic kidney disease: Secondary | ICD-10-CM | POA: Diagnosis not present

## 2023-06-13 DIAGNOSIS — N183 Chronic kidney disease, stage 3 unspecified: Secondary | ICD-10-CM | POA: Diagnosis not present

## 2023-06-18 DIAGNOSIS — N184 Chronic kidney disease, stage 4 (severe): Secondary | ICD-10-CM | POA: Diagnosis not present

## 2023-06-18 DIAGNOSIS — E876 Hypokalemia: Secondary | ICD-10-CM | POA: Diagnosis not present

## 2023-06-18 DIAGNOSIS — E1122 Type 2 diabetes mellitus with diabetic chronic kidney disease: Secondary | ICD-10-CM | POA: Diagnosis not present

## 2023-06-18 DIAGNOSIS — I129 Hypertensive chronic kidney disease with stage 1 through stage 4 chronic kidney disease, or unspecified chronic kidney disease: Secondary | ICD-10-CM | POA: Diagnosis not present

## 2023-06-18 DIAGNOSIS — D631 Anemia in chronic kidney disease: Secondary | ICD-10-CM | POA: Diagnosis not present

## 2023-06-18 DIAGNOSIS — N261 Atrophy of kidney (terminal): Secondary | ICD-10-CM | POA: Diagnosis not present

## 2023-06-18 DIAGNOSIS — R809 Proteinuria, unspecified: Secondary | ICD-10-CM | POA: Diagnosis not present

## 2023-06-18 DIAGNOSIS — E872 Acidosis, unspecified: Secondary | ICD-10-CM | POA: Diagnosis not present

## 2023-06-20 ENCOUNTER — Ambulatory Visit: Payer: Medicare HMO

## 2023-06-25 ENCOUNTER — Ambulatory Visit: Payer: Medicare HMO

## 2023-06-25 VITALS — BP 146/48 | HR 66 | Ht 65.0 in | Wt 184.0 lb

## 2023-06-25 DIAGNOSIS — I2510 Atherosclerotic heart disease of native coronary artery without angina pectoris: Secondary | ICD-10-CM

## 2023-06-25 DIAGNOSIS — I10 Essential (primary) hypertension: Secondary | ICD-10-CM | POA: Diagnosis not present

## 2023-06-25 DIAGNOSIS — E782 Mixed hyperlipidemia: Secondary | ICD-10-CM

## 2023-06-25 DIAGNOSIS — I5042 Chronic combined systolic (congestive) and diastolic (congestive) heart failure: Secondary | ICD-10-CM | POA: Diagnosis not present

## 2023-06-25 MED ORDER — CARVEDILOL 12.5 MG PO TABS: 12.5000 mg | ORAL_TABLET | Freq: Two times a day (BID) | ORAL | 3 refills | Status: DC

## 2023-06-25 NOTE — Assessment & Plan Note (Addendum)
Appears euvolemic, compensated. NYHA class II/III.  Educated about salt and fluid restriction to below 2 g/day and below 2 L/day respectively. Advised about maintaining home log of weight daily in the mornings. If the weight goes up by 2 to 3 pounds within a day or 5 pounds within a week, take additional dose of furosemide 40 mg in the afternoon.  GDMT limited by renal function. SGLT2 inhibitor initiation limited due to urinary incontinence and frequent urinary tract infections. Continue with beta-blocker therapy. Will switch from metoprolol to carvedilol 12.5 mg twice daily given her uncontrolled blood pressures. Discontinue amlodipine given her cardiomyopathy. Continue low-dose losartan 25 mg once daily. Given renal function, not a candidate for Entresto, Aldactone.   Recommend reviewing with PCP with regards to further need for pioglitazone [Actos] in the setting of her cardiomyopathy and heart failure symptoms. Similarly recommend reviewing with PCP and nephrologist regards to her ongoing use of doxazosin, given her history of heart failure and cardiomyopathy as this can have negative effects.  Will forward this office note to PCPs office and her nephrologist office.

## 2023-06-25 NOTE — Assessment & Plan Note (Addendum)
Last panel 05/22/2023 total cholesterol 114, HDL 35, LDL 57, triglycerides 120.  Continue with current dose of atorvastatin 40 mg once daily.

## 2023-06-25 NOTE — Assessment & Plan Note (Signed)
Blood pressure suboptimally controlled at this time. Proceed with further optimization of the medications as below. Continue losartan 25 mg once daily Will discontinue amlodipine and metoprolol and start carvedilol 12.5 mg twice daily.  Continue hydralazine 75 mg 3 times daily.  Advised to keep a log of blood pressure readings at home.

## 2023-06-25 NOTE — Progress Notes (Signed)
Cardiology Consultation:    Date:  06/25/2023   ID:  Amber [name], DOB 14-Apr-1946, MRN [id number]  PCP:  Amber Greenspan, MD  Cardiologist:  Amber Corporal Jahrel Borthwick, MD   Referring MD: Amber Greenspan, MD   No chief complaint on file.    ASSESSMENT AND PLAN:   [name] 77 year old woman with history of CAD s/p PCI of LAD November 2024, CHF with mildly reduced LVEF and elevated filling pressures by right heart cath, mild MR, hypertension, hyperlipidemia, diabetes mellitus, CKD stage IV solitary left kidney, COPD, right breast cancer mastectomy in 2020, chronic anemia, obesity, hiatal hernia, GERD Presents for follow-up visit   Problem List Items Addressed This Visit     HLD (hyperlipidemia)   Last panel 05/22/2023 total cholesterol 114, HDL 35, LDL 57, triglycerides 120.  Continue with current dose of atorvastatin 40 mg once daily.       CAD (coronary artery disease), cath 2019 nonobstructive; recent cath 05/09/2023 severe LAD disease and moderate RCA disease; s/p PCI of LAD 05/10/2023 at [facility]   Continue with dual antiplatelet therapy aspirin 81 mg once daily and Brilinta 90 mg twice daily. She has been compliant with this. Continue atorvastatin 40 mg once daily.       Chronic CHF (congestive heart failure) (HCC), mildly reduced LVEF 45 to 50% and apical  WMA by TTE 05/02/2023 at [name]; RHC 05/09/2023 LVEDP 34 mmHg, nml CO, PCWP 15, PA 49/18 mean 30   Appears euvolemic, compensated. NYHA class II/III.  Educated about salt and fluid restriction to below 2 g/day and below 2 L/day respectively. Advised about maintaining home log of weight daily in the mornings. If the weight goes up by 2 to 3 pounds within a day or 5 pounds within a week, take additional dose of furosemide 40 mg in the afternoon.  GDMT limited by renal function. SGLT2 inhibitor initiation limited due to urinary incontinence and frequent urinary tract infections. Continue with  beta-blocker therapy. Will switch from metoprolol to carvedilol 12.5 mg twice daily given her uncontrolled blood pressures. Discontinue amlodipine given her cardiomyopathy. Continue low-dose losartan 25 mg once daily. Given renal function, not a candidate for Entresto, Aldactone.   Recommend reviewing with PCP with regards to further need for pioglitazone [Actos] in the setting of her cardiomyopathy and heart failure symptoms. Similarly recommend reviewing with PCP and nephrologist regards to her ongoing use of doxazosin, given her history of heart failure and cardiomyopathy as this can have negative effects.  Will forward this office note to PCPs office and her nephrologist office.      Hypertension   Blood pressure suboptimally controlled at this time. Proceed with further optimization of the medications as below. Continue losartan 25 mg once daily Will discontinue amlodipine and metoprolol and start carvedilol 12.5 mg twice daily.  Continue hydralazine 75 mg 3 times daily.  Advised to keep a log of blood pressure readings at home.       Return to clinic in 3 months or as needed.   History of Present Illness:    Amber [name] is a 77 y.o. female who is being seen today for follow-up visit. Initial visit with me in the office was 05-01-2023 Was sent to the ER due to concerns about acute chest pain and shortness of breath possible ACS versus acute CHF. From [name] in setting of abnormal stress test was transferred to [name], identified with mildly reduced LVEF 45 to 50%,  obstructive CAD underwent PCI of LAD. Subsequently had follow-up visit here at our office 05/20/2023 with Amber Bamberg, NP-C. PCP is Amber Greenspan, MD.  Nephrology she follows up with this Dr. Nicholes Stairs woman here for the visit today by herself.  Has history of CAD nonobstructive to cath in 11/25/2017, more recently s/p PCI of LAD November 2024, CHF with mildly reduced EF  45 to 50%  [elevated LVEDP 34 mmHg, CO 5.2 L/min, CI 2.7 L/min/m, PCWP 15 mmHg, PA 49/18 mean 30, PVR 2.87 Woods], hypertension, diabetes mellitus, hyperlipidemia, mild MR, CKD stage IV solitary left kidney, COPD, right breast cancer s/p mastectomy in 2020 [no chemoradiation therapy, stopped anastrozole in May 2023], chronic anemia, obesity, GERD, hiatal hernia.  Mentions overall she has seen some improvement over the last couple months.  Still remains short of breath with exertion.  Has had upper respiratory infections and cord infections since her last office visit with me. Occasionally with increasing swelling of the legs she takes extra doses of Lasix.  Last additional dose of Lasix 40 mg was taken on Sunday past week.  She has not been keeping close tabs on her weight at home.  Denies any chest pain.  Denies any syncopal episodes but was feeling lightheaded and amlodipine dose was reduced to 5 mg by PCP. Mentions good compliance with her medications at home.  Blood pressures have been fluctuating recently with elevated systolic blood pressure and low diastolic blood pressures.  She does have symptoms with urinary incontinence and occasional urinary tract infections.  Echocardiogram 05/02/2023 done at [facility] with LVEF 45 to 50%, mild MR, RVSP 39 mmHg.  Apical segments hypokinetic  Cath initially 05/09/2023 with high-grade proximal LAD lesion, moderate RCA 60% stenosis, 30% LCx ostial lesion. Right heart cath at the time noted normal cardiac output 5.2, cardiac index 2.7, elevated LVEDP 34 mmHg, PCWP 15 mmHg, PA 49/18 with mean 30, PVR 2.87 Woods units PCI 05/10/2023 of ostial to proximal LAD.  Last lipid panel to review 05/22/2023 total cholesterol 114, HDL 35, LDL 57, triglycerides 120. LFTs 05/22/2023 were unremarkable except for low total protein 5.9. Last BNP available to review 05/09/2023 during inpatient stay was 836, elevated Baseline creatinine appears to range from  1.5-1.7 and EGFR in upper 20s to low 30s   Past Medical History:  Diagnosis Date   Abnormal stress test 08/15/2017   Formatting of this note might be different from the original.  Added automatically from request for surgery [id number]     Last Assessment & Plan:   Formatting of this note might be different from the original.  Mrs Zeidler is a pleasant 77 year-old female who underwent a nuclear stress test as outpatient  revealing a moderate degree, mid-inferior wall perfusion abnormality. The patient presented to th   Acute HFrEF (heart failure with reduced ejection fraction) (HCC) 05/11/2023   Acute kidney injury superimposed on chronic kidney disease (HCC) 08/16/2017   Last Assessment & Plan:   Formatting of this note might be different from the original.  Followed by [name], Dr. Lowell Guitar  Requested medical records from [name] for review  IVF NS@75  ml/hr  Avoid nephrotoxins and NSAIDs  Renally dose medications     Acute respiratory failure with hypoxia (HCC) 05/08/2023   B12 deficiency anemia 03/23/2021   Bilateral hand numbness 03/29/2021   Bilateral hand swelling 03/29/2021   Bilateral knee pain 03/29/2021   Bilateral shoulder pain 04/18/2021   CAD (coronary artery disease) 05/11/2023  Chest pain of unknown etiology 08/15/2017   Formatting of this note might be different from the original.  Added automatically from request for surgery [id number]     Last Assessment & Plan:   Formatting of this note might be different from the original.  Patient stable at this time with no recurrent chest discomfort this morning     Chronic anemia 03/20/2021   Chronic kidney disease    Chronic obstructive pulmonary disease (HCC) 05/08/2023   CKD (chronic kidney disease) stage 4, GFR 15-29 ml/min (HCC) 05/08/2023   Diabetes (HCC)    Dyspnea on exertion 05/01/2023   Essential hypertension 08/16/2017   Last Assessment & Plan:   Formatting of this note might be different from the  original.  Patient has been hypertensive since admission. She appears anxious regarding her upcoming cardiovascular testing. Will continue to monitor and uptitrate antihypertensives as needed     Euthyroid goiter 08/17/2017   GERD (gastroesophageal reflux disease) 08/16/2017   Last Assessment & Plan:   Formatting of this note might be different from the original.  PPI     HLD (hyperlipidemia) 08/16/2017   Last Assessment & Plan:   Formatting of this note might be different from the original.  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  Accu-Cheks AC and HS with sliding scale insulin and hypoglycemic protocol      Past Surgical History:  Procedure Laterality Date   BACK SURGERY     x 2   BREAST BIOPSY Right    x3   CORONARY STENT INTERVENTION N/A 05/10/2023   Procedure: CORONARY STENT INTERVENTION;  Surgeon: Marykay Lex, MD;  Location: [facility] INVASIVE CV LAB;  Service: Cardiovascular;  Laterality: N/A;   RIGHT/LEFT HEART CATH AND CORONARY ANGIOGRAPHY N/A 05/09/2023   Procedure: RIGHT/LEFT HEART CATH AND CORONARY ANGIOGRAPHY;  Surgeon: Orbie Pyo, MD;  Location: [facility] INVASIVE CV LAB;  Service: Cardiovascular;  Laterality: N/A;    Current Medications: Current Meds  Medication Sig   albuterol (VENTOLIN HFA) 108 (90 Base) MCG/ACT inhaler Inhale 2 puffs into the lungs every 4 (four) hours as needed.   aspirin 81 MG EC tablet Take 81 mg by mouth daily.   atorvastatin  (LIPITOR) 40 MG tablet TAKE 1 TABLET BY MOUTH EVERY DAY   Cholecalciferol 125 MCG (5000 UT) capsule Take 5,000 Units by mouth daily.   doxazosin (CARDURA) 2 MG tablet Take 2 mg by mouth daily.   famotidine (PEPCID) 40 MG tablet Take 40 mg by mouth 2 (two) times daily.   fluticasone furoate-vilanterol (BREO ELLIPTA) 100-25 MCG/INH AEPB Inhale 1 puff into the lungs daily.   furosemide (LASIX) 40 MG tablet Take 40 mg by mouth daily.   glimepiride (AMARYL) 2 MG tablet Take 2 mg by mouth daily with breakfast.   hydrALAZINE (APRESOLINE) 50 MG tablet Take 1.5 tablets (75 mg total) by mouth 3 (three) times daily.   losartan (COZAAR) 25 MG tablet TAKE 1 TABLET (25 MG TOTAL)  BY MOUTH DAILY.   nitroGLYCERIN (NITROSTAT) 0.4 MG SL tablet Place 0.4 mg under the tongue every 5 (five) minutes as needed for chest pain.   omeprazole (PRILOSEC) 40 MG capsule Take 40 mg by mouth 2 (two) times daily.   pioglitazone (ACTOS) 15 MG tablet Take 15 mg by mouth daily.   potassium chloride (KLOR-CON) 10 MEQ tablet Take 10 mEq by mouth 2 (two) times daily.   sodium bicarbonate 650 MG tablet Take 650 mg by mouth daily as needed for heartburn.   ticagrelor (BRILINTA) 90 MG TABS tablet Take 1 tablet (90 mg total) by mouth 2 (two) times daily.   [DISCONTINUED] amLODipine (NORVASC) 10 MG tablet Take 1 tablet (10 mg total) by mouth daily.   [DISCONTINUED] metoprolol succinate (TOPROL-XL) 50 MG 24 hr tablet Take 50 mg by mouth 2 (two) times daily.     Allergies:   Advil [ibuprofen] and Aleve [naproxen]   Social History   Socioeconomic History   Marital status: Married    Spouse name: Yoseli Rubalcava   Number of children: Not on file   Years of education: Not on file   Highest education level: Not on file  Occupational History   Not on file  Tobacco Use   Smoking status: Former    Current packs/day: 0.00    Average packs/day: 1 pack/day for 50.0 years (50.0 ttl pk-yrs)    Types: Cigarettes    Start date: 48    Quit  date: 2014    Years since quitting: 10.9   Smokeless tobacco: Never  Vaping Use   Vaping status: Never Used  Substance and Sexual Activity   Alcohol use: Not Currently   Drug use: Not Currently   Sexual activity: Not Currently  Other Topics Concern   Not on file  Social History Narrative   Not on file   Social Drivers of Health   Financial Resource Strain: Not on file  Food Insecurity: No Food Insecurity (05/03/2023)   Hunger Vital Sign    Worried About Running Out of Food in the Last Year: Never true    Ran Out of Food in the Last Year: Never true  Transportation Needs: No Transportation Needs (05/03/2023)   PRAPARE - Administrator, Civil Service (Medical): No    Lack of Transportation (Non-Medical): No  Physical Activity: Not on file  Stress: Not on file  Social Connections: Not on file     Family History: The patient's family history includes Diabetes in her sister; Healthy in her son and son; Heart disease in her father and sister; Kidney disease in her sister; Throat cancer in her mother. ROS:   Please see the history of present illness.    All 14 point review of systems negative except as described per history of present illness.  EKGs/Labs/Other Studies Reviewed:    The following studies were reviewed today:   EKG:       Recent Labs: 05/09/2023: B Natriuretic Peptide 836.4 05/12/2023: BUN 21; Creatinine, Ser 1.73; Hemoglobin 9.1; Platelets 217; Potassium 3.8; Sodium 140 05/22/2023: ALT 13  Recent Lipid Panel    Component Value Date/Time   CHOL 114 05/22/2023 1058   TRIG 120 05/22/2023 1058   HDL 35 (L) 05/22/2023 1058   CHOLHDL 3.3 05/22/2023 1058   CHOLHDL 3.8 05/08/2023 0313   VLDL 27 05/08/2023 0313   LDLCALC 57 05/22/2023 1058    Physical Exam:    VS:  BP (!) 146/48   Pulse 66  Ht 5\' 5"  (1.651 m)   Wt 184 lb (83.5 kg)   SpO2 98%   BMI 30.62 kg/m     Wt Readings from Last 3 Encounters:  06/25/23 184 lb (83.5 kg)  05/20/23  186 lb (84.4 kg)  05/11/23 188 lb 0.8 oz (85.3 kg)     GENERAL:  Well nourished, well developed in no acute distress NECK: No JVD; No carotid bruits CARDIAC: RRR, S1 and S2 present, no murmurs, no rubs, no gallops CHEST:  Clear to auscultation without rales, wheezing or rhonchi  Extremities: No pitting pedal edema. Pulses bilaterally symmetric with radial 2+ and dorsalis pedis 2+ NEUROLOGIC:  Alert and oriented x 3  Medication Adjustments/Labs and Tests Ordered: Current medicines are reviewed at length with the patient today.  Concerns regarding medicines are outlined above.  No orders of the defined types were placed in this encounter.  No orders of the defined types were placed in this encounter.   Signed, Cecille Amsterdam, MD, MPH, [facility] 06/25/2023 2:16 PM    [facility]

## 2023-06-25 NOTE — Patient Instructions (Signed)
Medication Instructions:  Your physician has recommended you make the following change in your medication:   STOP: Amlodipine STOP: Metoprolol START: Coreg 12.5 mg twice daily  *If you need a refill on your cardiac medications before your next appointment, please call your pharmacy*   Lab Work: None If you have labs (blood work) drawn today and your tests are completely normal, you will receive your results only by: MyChart Message (if you have MyChart) OR A paper copy in the mail If you have any lab test that is abnormal or we need to change your treatment, we will call you to review the results.   Testing/Procedures: None   Follow-Up: At [facility], you and your health needs are our priority.  As part of our continuing mission to provide you with exceptional heart care, we have created designated Provider Care Teams.  These Care Teams include your primary Cardiologist (physician) and Advanced Practice Providers (APPs -  Physician Assistants and Nurse Practitioners) who all work together to provide you with the care you need, when you need it.  We recommend signing up for the patient portal called "MyChart".  Sign up information is provided on this After Visit Summary.  MyChart is used to connect with patients for Virtual Visits (Telemedicine).  Patients are able to view lab/test results, encounter notes, upcoming appointments, etc.  Non-urgent messages can be sent to your provider as well.   To learn more about what you can do with MyChart, go to [email or web address].    Your next appointment:   3 month(s)  Provider:   Huntley Dec, MD    Other Instructions None

## 2023-06-25 NOTE — Assessment & Plan Note (Signed)
Continue with dual antiplatelet therapy aspirin 81 mg once daily and Brilinta 90 mg twice daily. She has been compliant with this. Continue atorvastatin 40 mg once daily.

## 2023-06-28 DIAGNOSIS — J449 Chronic obstructive pulmonary disease, unspecified: Secondary | ICD-10-CM | POA: Diagnosis not present

## 2023-07-10 DIAGNOSIS — E669 Obesity, unspecified: Secondary | ICD-10-CM | POA: Diagnosis not present

## 2023-07-10 DIAGNOSIS — E1121 Type 2 diabetes mellitus with diabetic nephropathy: Secondary | ICD-10-CM | POA: Diagnosis not present

## 2023-07-17 DIAGNOSIS — E785 Hyperlipidemia, unspecified: Secondary | ICD-10-CM | POA: Diagnosis not present

## 2023-07-17 DIAGNOSIS — E1121 Type 2 diabetes mellitus with diabetic nephropathy: Secondary | ICD-10-CM | POA: Diagnosis not present

## 2023-07-17 DIAGNOSIS — E1122 Type 2 diabetes mellitus with diabetic chronic kidney disease: Secondary | ICD-10-CM | POA: Diagnosis not present

## 2023-07-18 LAB — HEMOGLOBIN A1C: A1c: 5.4

## 2023-07-18 LAB — COMPREHENSIVE METABOLIC PANEL WITH GFR: EGFR: 28

## 2023-07-25 DIAGNOSIS — E1122 Type 2 diabetes mellitus with diabetic chronic kidney disease: Secondary | ICD-10-CM | POA: Diagnosis not present

## 2023-07-25 DIAGNOSIS — N183 Chronic kidney disease, stage 3 unspecified: Secondary | ICD-10-CM | POA: Diagnosis not present

## 2023-07-25 DIAGNOSIS — Z6829 Body mass index (BMI) 29.0-29.9, adult: Secondary | ICD-10-CM | POA: Diagnosis not present

## 2023-07-25 DIAGNOSIS — I129 Hypertensive chronic kidney disease with stage 1 through stage 4 chronic kidney disease, or unspecified chronic kidney disease: Secondary | ICD-10-CM | POA: Diagnosis not present

## 2023-07-25 DIAGNOSIS — N184 Chronic kidney disease, stage 4 (severe): Secondary | ICD-10-CM | POA: Diagnosis not present

## 2023-08-01 DIAGNOSIS — E113211 Type 2 diabetes mellitus with mild nonproliferative diabetic retinopathy with macular edema, right eye: Secondary | ICD-10-CM | POA: Diagnosis not present

## 2023-08-10 DIAGNOSIS — E1121 Type 2 diabetes mellitus with diabetic nephropathy: Secondary | ICD-10-CM | POA: Diagnosis not present

## 2023-08-10 DIAGNOSIS — E669 Obesity, unspecified: Secondary | ICD-10-CM | POA: Diagnosis not present

## 2023-09-05 DIAGNOSIS — E113211 Type 2 diabetes mellitus with mild nonproliferative diabetic retinopathy with macular edema, right eye: Secondary | ICD-10-CM | POA: Diagnosis not present

## 2023-09-07 DIAGNOSIS — E669 Obesity, unspecified: Secondary | ICD-10-CM | POA: Diagnosis not present

## 2023-09-07 DIAGNOSIS — E1121 Type 2 diabetes mellitus with diabetic nephropathy: Secondary | ICD-10-CM | POA: Diagnosis not present

## 2023-09-09 DIAGNOSIS — N184 Chronic kidney disease, stage 4 (severe): Secondary | ICD-10-CM | POA: Diagnosis not present

## 2023-09-17 DIAGNOSIS — R809 Proteinuria, unspecified: Secondary | ICD-10-CM | POA: Diagnosis not present

## 2023-09-17 DIAGNOSIS — E876 Hypokalemia: Secondary | ICD-10-CM | POA: Diagnosis not present

## 2023-09-17 DIAGNOSIS — E1122 Type 2 diabetes mellitus with diabetic chronic kidney disease: Secondary | ICD-10-CM | POA: Diagnosis not present

## 2023-09-17 DIAGNOSIS — N261 Atrophy of kidney (terminal): Secondary | ICD-10-CM | POA: Diagnosis not present

## 2023-09-17 DIAGNOSIS — D631 Anemia in chronic kidney disease: Secondary | ICD-10-CM | POA: Diagnosis not present

## 2023-09-17 DIAGNOSIS — I129 Hypertensive chronic kidney disease with stage 1 through stage 4 chronic kidney disease, or unspecified chronic kidney disease: Secondary | ICD-10-CM | POA: Diagnosis not present

## 2023-09-17 DIAGNOSIS — N184 Chronic kidney disease, stage 4 (severe): Secondary | ICD-10-CM | POA: Diagnosis not present

## 2023-09-20 ENCOUNTER — Other Ambulatory Visit: Payer: Self-pay

## 2023-09-24 ENCOUNTER — Ambulatory Visit: Payer: Medicare HMO

## 2023-09-24 VITALS — BP 128/72 | HR 58 | Ht 65.6 in | Wt 178.8 lb

## 2023-09-24 DIAGNOSIS — I10 Essential (primary) hypertension: Secondary | ICD-10-CM

## 2023-09-24 DIAGNOSIS — I5042 Chronic combined systolic (congestive) and diastolic (congestive) heart failure: Secondary | ICD-10-CM | POA: Diagnosis not present

## 2023-09-24 DIAGNOSIS — I2510 Atherosclerotic heart disease of native coronary artery without angina pectoris: Secondary | ICD-10-CM | POA: Diagnosis not present

## 2023-09-24 MED ORDER — ATORVASTATIN CALCIUM 40 MG PO TABS: 40.0000 mg | ORAL_TABLET | Freq: Every day | ORAL | 3 refills | Status: AC

## 2023-09-24 MED ORDER — CLOPIDOGREL BISULFATE 75 MG PO TABS: 75.0000 mg | ORAL_TABLET | Freq: Every day | ORAL | 3 refills | Status: AC

## 2023-09-24 MED ORDER — CARVEDILOL 12.5 MG PO TABS: 12.5000 mg | ORAL_TABLET | Freq: Two times a day (BID) | ORAL | 3 refills | Status: AC

## 2023-09-24 NOTE — Assessment & Plan Note (Signed)
 Euvolemic, compensated.  NYHA class II. Continues to be adherent with salt and fluid restriction to below 2 g/day and 2 L/day respectively. Weight has gone down by 6 pounds since her last recorded weight in December 2024.  Done and directed medical therapy limited due to renal function and urinary incontinence/urinary tract infections. - Continue beta-blocker therapy with carvedilol 12.5 mg twice daily.  Heart rates at baseline in 50s, unable to escalate further. - Continue with losartan 100 mg once daily. - Given the renal function not a candidate for spironolactone. On hydralazine 75 mg 3 times daily.

## 2023-09-24 NOTE — Assessment & Plan Note (Signed)
 Doing well. No chest pain.  Asymptomatic. Continue with dual antiplatelet therapy for at least 12 months.  [Tentatively until November 2025]. Continue aspirin 81 mg once daily indefinitely. Continue Brilinta 90 mg twice daily, she has another 3-week supply.  After this switch to Plavix 75 mg once daily. Prescription for Plavix is being sent.  Continue atorvastatin 40 mg once daily.  She is out of this prescription for the last couple days.  New prescription being sent.

## 2023-09-24 NOTE — Progress Notes (Signed)
 Cardiology Consultation:    Date:  09/24/2023   ID:  Amber [name] Amber [name], DOB 29-Mar-1946, MRN [id number]  PCP:  Amber [name]  Cardiologist:  Amber Corporal Chiquetta Langner, [name]   Referring [name]: Amber [name]  Nephrologist: Follows up with Dr. Malen [name] at [facility] in [place]    ASSESSMENT AND PLAN:   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 She was inquiring about eye procedure if she can get it done from cardiac standpoint. She is okay to proceed with any eye intervention as required from cardiac standpoint, would recommend not to interrupt dual antiplatelet therapy.  If any specific instructions are required further with regards to any upcoming intervention, would recommend ophthalmologist to forward our office any specific questions or concerns.  Problem List Items Addressed This Visit     CAD (coronary artery disease), cath 2019 nonobstructive; recent cath 05/09/2023 severe LAD disease and moderate RCA disease; s/p PCI of LAD 05/10/2023 at [facility]   Doing well. No chest pain.  Asymptomatic. Continue with dual antiplatelet therapy for at least 12 months.  [Tentatively until November 2025]. Continue aspirin 81 mg once daily indefinitely. Continue Brilinta 90 mg twice daily, she has another 3-week supply.  After this switch to Plavix 75 mg once daily. Prescription for Plavix is being sent.  Continue atorvastatin 40 mg once daily.  She is out of this prescription for the last couple days.  New prescription being sent.       Relevant Medications   losartan (COZAAR) 100 MG tablet    atorvastatin (LIPITOR) 40 MG tablet   carvedilol (COREG) 12.5 MG tablet   Chronic CHF (congestive heart failure) (HCC), mildly reduced LVEF 45 to 50% and apical  WMA by TTE 05/02/2023 at [facility]; [facility] 05/09/2023 LVEDP 34 mmHg, nml CO, PCWP 15, PA 49/18 mean 30 - Primary   Euvolemic, compensated.  NYHA class II. Continues to be adherent with salt and fluid restriction to below 2 g/day and 2 L/day respectively. Weight has gone down by 6 pounds since her last recorded weight in December 2024.  Done and directed medical therapy limited due to renal function and urinary incontinence/urinary tract infections. - Continue beta-blocker therapy with carvedilol 12.5 mg twice daily.  Heart rates at baseline in 50s, unable to escalate further. - Continue with losartan 100 mg once daily. - Given the renal function not a candidate for spironolactone. On hydralazine 75 mg 3 times daily.      Relevant Medications   losartan (COZAAR) 100 MG tablet   atorvastatin (LIPITOR) 40 MG tablet   carvedilol (COREG) 12.5 MG tablet   Hypertension   Well-controlled. Continue current medications losartan, carvedilol, hydralazine as above.       Relevant Medications   losartan (COZAAR) 100 MG tablet   atorvastatin (LIPITOR) 40 MG tablet   carvedilol (COREG) 12.5 MG tablet   Return to clinic in 1 year or as needed.    History of Present Illness:    Amber [name] is a 78 y.o. female who is being seen today for follow-up visit. Last visit with me in the office was 06/25/2023. PCP is Amber [name],  Amber Sandy, [name].  Pleasant woman here for the visit by herself.  Lives with her husband at home.  Has history of CAD s/p PCI of LAD November 2024, CHF with mildly reduced LVEF 45 to 50%, mildly elevated filling pressures by right heart catheterization LVEDP 34 mmHg, PCWP 15 mmHg, mean PA pressure 30 mmHg, mild mitral regurgitation, hypertension, hyperlipidemia, diabetes mellitus, CKD stage IV with solitary left  kidney, COPD, right breast cancer s/p mastectomy in 2020, chronic anemia, obesity, hiatal hernia, GERD.  At last office visit with [place] recommended further review with PCP and nephrologist regarding ongoing use of pioglitazone and doxazosin given her cardiomyopathy/heart failure.  She is off pioglitazone.  She continues to be on doxazosin as per med list but she is not sure if she is in fact taking this at home and will confirm when she is done with the visit today and returns home.  Mentions overall she is doing well at home.  Taking her medications as scheduled. Denies any chest pain, shortness of breath, orthopnea, paroxysmal nocturnal dyspnea.  No pedal edema.  No blood in urine or stools. Weight has gone down by 6 pounds since December.  Good compliance with medications. Mentions Brilinta has been expensive and she is afraid she will be falling into the donut hole and has to pay significant money for her medications.   Last echocardiogram from May 02, 2023 at [facility] LVEF 45 to 50%, mild MR, RVSP 39 mmHg, apical segments hypokinetic.  Cath initially 05/09/2023 with high-grade proximal LAD lesion, moderate RCA 60% stenosis, 30% LCx ostial lesion. Right heart cath at the time noted normal cardiac output 5.2, cardiac index 2.7, elevated LVEDP 34 mmHg, PCWP 15 mmHg, PA 49/18 with mean 30, PVR 2.87 Woods units PCI 05/10/2023 of ostial to proximal LAD   Past Medical History:  Diagnosis Date   Abnormal stress test 08/15/2017   Formatting of this note might be different from the original.  Added automatically from request for surgery [id number]     Last Assessment & Plan:   Formatting of this note might be different from the original.  Amber [name] is a pleasant 77 year-old female who underwent a nuclear stress test as outpatient  revealing a moderate degree, mid-inferior wall perfusion abnormality. The patient presented to th   Acute HFrEF (heart failure with reduced ejection  fraction) (HCC) 05/11/2023   Acute kidney injury superimposed on chronic kidney disease (HCC) 08/16/2017   Last Assessment & Plan:   Formatting of this note might be different from the original.  Followed by [facility], Dr. Lowell Guitar  Requested medical records from [facility] for review  IVF NS@75  ml/hr  Avoid nephrotoxins and NSAIDs  Renally dose medications     Acute respiratory failure with hypoxia (HCC) 05/08/2023   B12 deficiency anemia 03/23/2021   Bilateral hand numbness 03/29/2021   Bilateral hand swelling 03/29/2021   Bilateral knee pain 03/29/2021   Bilateral shoulder pain 04/18/2021   CAD (coronary artery disease) 05/11/2023   Chest pain of unknown etiology 08/15/2017   Formatting of this note might be different from the original.  Added automatically from request for surgery [id number]     Last Assessment & Plan:   Formatting of this note might be different from the original.  Patient stable at this time with no recurrent chest discomfort this morning     Chronic anemia 03/20/2021   Chronic CHF (congestive heart failure) (HCC), mildly reduced LVEF 45 to 50% and apical  W[facility] by [facility]TE 05/[facility] at [facility]; [facility] 05/09/2023 LVEDP 34 mmHg, nml CO, PCWP 15, PA 49/18 mean 30 05/11/2023   Chronic kidney disease    Chronic obstructive pulmonary disease (HCC) 05/08/2023   CKD (chronic kidney disease) stage 4, GFR 15-29 ml/min (HCC) 05/08/2023   Diabetes (HCC)    Dyspnea on exertion 05/01/2023   Essential hypertension 08/16/2017   Last Assessment & Plan:   Formatting of this note might be different from the original.  Patient has been hypertensive since admission. She appears anxious regarding her upcoming cardiovascular testing. Will continue to monitor and uptitrate antihypertensives as needed     Euthyroid goiter 08/17/2017   GERD (gastroesophageal reflux disease) 08/16/2017   Last Assessment & Plan:   Formatting of this note might be different from the original.   PPI     HLD (hyperlipidemia) 08/16/2017   Last Assessment & Plan:   Formatting of this note might be different from the original.  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  Accu-Cheks AC and HS with sliding scale insulin and hypoglycemic protocol      Past Surgical History:  Procedure Laterality Date   BACK SURGERY     x 2   BREAST BIOPSY Right    x3   CORONARY STENT INTERVENTION N/A 05/10/2023   Procedure: CORONARY STENT INTERVENTION;  Surgeon: Marykay Lex, [name];  Location: [facility] INVASIVE CV LAB;  Service: Cardiovascular;  Laterality: N/A;   RIGHT/LEFT HEART CATH AND CORONARY ANGIOGRAPHY N/A 05/09/2023   Procedure: RIGHT/LEFT HEART CATH AND CORONARY ANGIOGRAPHY;  Surgeon: Orbie Pyo, [name];  Location: [facility] INVASIVE CV LAB;  Service: Cardiovascular;  Laterality: N/A;    Current Medications: Current Meds  Medication Sig   acetaminophen (TYLENOL) 650 MG CR tablet Take 650 mg by mouth every 8 (eight) hours as needed for pain.   albuterol (VENTOLIN HFA) 108 (90 Base) MCG/ACT inhaler Inhale 2 puffs into the lungs every 4 (four) hours as needed for wheezing or shortness of breath.   aspirin 81 MG EC tablet Take 81 mg by mouth daily.   Cholecalciferol 125 MCG (5000 UT) capsule Take 5,000 Units by mouth daily.   clopidogrel (PLAVIX) 75 MG tablet Take 1 tablet (75 mg total) by mouth daily.   doxazosin (CARDURA) 2 MG tablet Take 2 mg by mouth daily.   fluticasone  furoate-vilanterol (BREO ELLIPTA) 100-25 MCG/INH AEPB Inhale 1 puff into the lungs daily.   furosemide (LASIX) 40 MG tablet Take 40 mg by mouth daily.   glimepiride (AMARYL) 2 MG tablet Take 2 mg by mouth daily with breakfast.   hydrALAZINE (APRESOLINE) 50 MG tablet Take 1.5 tablets (75 mg total) by mouth 3 (three) times daily.   losartan (COZAAR) 100 MG tablet Take 100 mg by mouth daily.   nitroGLYCERIN (NITROSTAT) 0.4 MG SL tablet Place 0.4 mg under the tongue every 5 (five) minutes as needed for chest pain.   omeprazole (PRILOSEC) 40 MG capsule Take 40  mg by mouth 2 (two) times daily.   potassium chloride (KLOR-CON) 10 MEQ tablet Take 10 mEq by mouth 2 (two) times daily.   sodium bicarbonate 650 MG tablet Take 1,300 mg by mouth daily as needed for heartburn.   [DISCONTINUED] carvedilol (COREG) 12.5 MG tablet Take 1 tablet (12.5 mg total) by mouth 2 (two) times daily.   [DISCONTINUED] ticagrelor (BRILINTA) 90 MG TABS tablet Take 1 tablet (90 mg total) by mouth 2 (two) times daily.     Allergies:   Advil [ibuprofen] and Aleve [naproxen]   Social History   Socioeconomic History   Marital status: Married    Spouse name: Trystyn Sitts   Number of children: Not on file   Years of education: Not on file   Highest education level: Not on file  Occupational History   Not on file  Tobacco Use   Smoking status: Former    Current packs/day: 0.00    Average packs/day: 1 pack/day for 50.0 years (50.0 ttl pk-yrs)    Types: Cigarettes    Start date: 44    Quit date: 2014    Years since quitting: 11.2   Smokeless tobacco: Never  Vaping Use   Vaping status: Never Used  Substance and Sexual Activity   Alcohol use: Not Currently   Drug use: Not Currently   Sexual activity: Not Currently  Other Topics Concern   Not on file  Social History Narrative   Not on file   Social Drivers of Health   Financial Resource Strain: Not on file  Food Insecurity: No Food Insecurity (05/03/2023)    Hunger Vital Sign    Worried About Running Out of Food in the Last Year: Never true    Ran Out of Food in the Last Year: Never true  Transportation Needs: No Transportation Needs (05/03/2023)   PRAPARE - Administrator, Civil Service (Medical): No    Lack of Transportation (Non-Medical): No  Physical Activity: Not on file  Stress: Not on file  Social Connections: Not on file     Family History: The patient's family history includes Diabetes in her sister; Healthy in her son and son; Heart disease in her father and sister; Kidney disease in her sister; Throat cancer in her mother. ROS:   Please see the history of present illness.    All 14 point review of systems negative except as described per history of present illness.  EKGs/Labs/Other Studies Reviewed:    The following studies were reviewed today:   EKG:       Recent Labs: 05/09/2023: B Natriuretic Peptide 836.4 05/12/2023: BUN 21; Creatinine, Ser 1.73; Hemoglobin 9.1; Platelets 217; Potassium 3.8; Sodium 140 05/22/2023: ALT 13  Recent Lipid Panel    Component Value Date/Time   CHOL 114 05/22/2023 1058   TRIG 120 05/22/2023 1058   HDL 35 (L) 05/22/2023 1058   CHOLHDL 3.3 05/22/2023 1058   CHOLHDL 3.8 05/08/2023 0313   VLDL 27 05/08/2023 0313   LDLCALC 57 05/22/2023 1058    Physical Exam:    VS:  BP 128/72   Pulse (!) 58   Ht 5' 5.6" (1.666 m)   Wt 178 lb 12.8 oz (81.1 kg)   SpO2 99%   BMI 29.21 kg/m     Wt Readings from Last 3 Encounters:  09/24/23 178 lb 12.8 oz (81.1 kg)  06/25/23 184 lb (83.5 kg)  05/20/23 186 lb (84.4 kg)     GENERAL:  Well nourished, well developed in  no acute distress NECK: No JVD; No carotid bruits CARDIAC: RRR, S1 and S2 present, no murmurs, no rubs, no gallops CHEST:  Clear to auscultation without rales, wheezing or rhonchi  Extremities: No pitting pedal edema. Pulses bilaterally symmetric with radial 2+ and dorsalis pedis 2+ NEUROLOGIC:  Alert and oriented x  3  Medication Adjustments/Labs and Tests Ordered: Current medicines are reviewed at length with the patient today.  Concerns regarding medicines are outlined above.  No orders of the defined types were placed in this encounter.  Meds ordered this encounter  Medications   atorvastatin (LIPITOR) 40 MG tablet    Sig: Take 1 tablet (40 mg total) by mouth daily.    Dispense:  90 tablet    Refill:  3   carvedilol (COREG) 12.5 MG tablet    Sig: Take 1 tablet (12.5 mg total) by mouth 2 (two) times daily.    Dispense:  180 tablet    Refill:  3   clopidogrel (PLAVIX) 75 MG tablet    Sig: Take 1 tablet (75 mg total) by mouth daily.    Dispense:  90 tablet    Refill:  3    Signed, Zyshonne Malecha reddy Arelyn Gauer, [name], MPH, [facility]. 09/24/2023 1:16 PM    [facility]

## 2023-09-24 NOTE — Patient Instructions (Signed)
 Medication Instructions:  Your physician has recommended you make the following change in your medication:   Complete current RX of Brilinta and then stop. Start Plavix 75 mg daily after completion of Brilinta.  *If you need a refill on your cardiac medications before your next appointment, please call your pharmacy*   Lab Work: None ordered If you have labs (blood work) drawn today and your tests are completely normal, you will receive your results only by: MyChart Message (if you have MyChart) OR A paper copy in the mail If you have any lab test that is abnormal or we need to change your treatment, we will call you to review the results.   Testing/Procedures: None ordered   Follow-Up: At [facility], you and your health needs are our priority.  As part of our continuing mission to provide you with exceptional heart care, we have created designated Provider Care Teams.  These Care Teams include your primary Cardiologist (physician) and Advanced Practice Providers (APPs -  Physician Assistants and Nurse Practitioners) who all work together to provide you with the care you need, when you need it.  We recommend signing up for the patient portal called "MyChart".  Sign up information is provided on this After Visit Summary.  MyChart is used to connect with patients for Virtual Visits (Telemedicine).  Patients are able to view lab/test results, encounter notes, upcoming appointments, etc.  Non-urgent messages can be sent to your provider as well.   To learn more about what you can do with MyChart, go to [email or web address].    Your next appointment:   12 month(s)  The format for your next appointment:   In Person  Provider:   Huntley Dec, MD    Other Instructions none  Important Information About Sugar

## 2023-09-24 NOTE — Assessment & Plan Note (Signed)
 Well-controlled. Continue current medications losartan, carvedilol, hydralazine as above.

## 2023-10-03 DIAGNOSIS — E113211 Type 2 diabetes mellitus with mild nonproliferative diabetic retinopathy with macular edema, right eye: Secondary | ICD-10-CM | POA: Diagnosis not present

## 2023-10-08 DIAGNOSIS — I5020 Unspecified systolic (congestive) heart failure: Secondary | ICD-10-CM | POA: Diagnosis not present

## 2023-10-08 DIAGNOSIS — E1121 Type 2 diabetes mellitus with diabetic nephropathy: Secondary | ICD-10-CM | POA: Diagnosis not present

## 2023-10-30 DIAGNOSIS — Z6829 Body mass index (BMI) 29.0-29.9, adult: Secondary | ICD-10-CM | POA: Diagnosis not present

## 2023-10-30 DIAGNOSIS — R059 Cough, unspecified: Secondary | ICD-10-CM | POA: Diagnosis not present

## 2023-10-30 DIAGNOSIS — J449 Chronic obstructive pulmonary disease, unspecified: Secondary | ICD-10-CM | POA: Diagnosis not present

## 2023-10-30 DIAGNOSIS — Z20822 Contact with and (suspected) exposure to covid-19: Secondary | ICD-10-CM | POA: Diagnosis not present

## 2023-11-04 DIAGNOSIS — I129 Hypertensive chronic kidney disease with stage 1 through stage 4 chronic kidney disease, or unspecified chronic kidney disease: Secondary | ICD-10-CM | POA: Diagnosis not present

## 2023-11-04 DIAGNOSIS — I160 Hypertensive urgency: Secondary | ICD-10-CM | POA: Diagnosis not present

## 2023-11-04 DIAGNOSIS — I10 Essential (primary) hypertension: Secondary | ICD-10-CM | POA: Diagnosis not present

## 2023-11-04 DIAGNOSIS — E1165 Type 2 diabetes mellitus with hyperglycemia: Secondary | ICD-10-CM | POA: Diagnosis not present

## 2023-11-04 DIAGNOSIS — Z6828 Body mass index (BMI) 28.0-28.9, adult: Secondary | ICD-10-CM | POA: Diagnosis not present

## 2023-11-04 DIAGNOSIS — J441 Chronic obstructive pulmonary disease with (acute) exacerbation: Secondary | ICD-10-CM | POA: Diagnosis not present

## 2023-11-04 DIAGNOSIS — J189 Pneumonia, unspecified organism: Secondary | ICD-10-CM | POA: Diagnosis not present

## 2023-11-04 DIAGNOSIS — J440 Chronic obstructive pulmonary disease with acute lower respiratory infection: Secondary | ICD-10-CM | POA: Diagnosis not present

## 2023-11-04 DIAGNOSIS — N1832 Chronic kidney disease, stage 3b: Secondary | ICD-10-CM | POA: Diagnosis not present

## 2023-11-04 DIAGNOSIS — E1122 Type 2 diabetes mellitus with diabetic chronic kidney disease: Secondary | ICD-10-CM | POA: Diagnosis not present

## 2023-11-04 DIAGNOSIS — I2510 Atherosclerotic heart disease of native coronary artery without angina pectoris: Secondary | ICD-10-CM | POA: Diagnosis not present

## 2023-11-04 DIAGNOSIS — R0602 Shortness of breath: Secondary | ICD-10-CM | POA: Diagnosis not present

## 2023-11-04 DIAGNOSIS — Z20822 Contact with and (suspected) exposure to covid-19: Secondary | ICD-10-CM | POA: Diagnosis not present

## 2023-11-04 DIAGNOSIS — R059 Cough, unspecified: Secondary | ICD-10-CM | POA: Diagnosis not present

## 2023-11-04 DIAGNOSIS — E8722 Chronic metabolic acidosis: Secondary | ICD-10-CM | POA: Diagnosis not present

## 2023-11-04 DIAGNOSIS — J449 Chronic obstructive pulmonary disease, unspecified: Secondary | ICD-10-CM | POA: Diagnosis not present

## 2023-11-04 DIAGNOSIS — N179 Acute kidney failure, unspecified: Secondary | ICD-10-CM | POA: Diagnosis not present

## 2023-11-04 DIAGNOSIS — D631 Anemia in chronic kidney disease: Secondary | ICD-10-CM | POA: Diagnosis not present

## 2023-11-04 DIAGNOSIS — J9620 Acute and chronic respiratory failure, unspecified whether with hypoxia or hypercapnia: Secondary | ICD-10-CM | POA: Diagnosis not present

## 2023-11-05 DIAGNOSIS — J441 Chronic obstructive pulmonary disease with (acute) exacerbation: Secondary | ICD-10-CM | POA: Diagnosis not present

## 2023-11-05 DIAGNOSIS — I160 Hypertensive urgency: Secondary | ICD-10-CM | POA: Diagnosis not present

## 2023-11-05 DIAGNOSIS — E8722 Chronic metabolic acidosis: Secondary | ICD-10-CM | POA: Diagnosis not present

## 2023-11-06 DIAGNOSIS — I160 Hypertensive urgency: Secondary | ICD-10-CM | POA: Diagnosis not present

## 2023-11-06 DIAGNOSIS — J441 Chronic obstructive pulmonary disease with (acute) exacerbation: Secondary | ICD-10-CM | POA: Diagnosis not present

## 2023-11-06 DIAGNOSIS — E8722 Chronic metabolic acidosis: Secondary | ICD-10-CM | POA: Diagnosis not present

## 2023-11-07 DIAGNOSIS — E1121 Type 2 diabetes mellitus with diabetic nephropathy: Secondary | ICD-10-CM | POA: Diagnosis not present

## 2023-11-07 DIAGNOSIS — I5020 Unspecified systolic (congestive) heart failure: Secondary | ICD-10-CM | POA: Diagnosis not present

## 2023-11-18 DIAGNOSIS — Z6827 Body mass index (BMI) 27.0-27.9, adult: Secondary | ICD-10-CM | POA: Diagnosis not present

## 2023-11-18 DIAGNOSIS — T50905A Adverse effect of unspecified drugs, medicaments and biological substances, initial encounter: Secondary | ICD-10-CM | POA: Diagnosis not present

## 2023-11-18 DIAGNOSIS — E1121 Type 2 diabetes mellitus with diabetic nephropathy: Secondary | ICD-10-CM | POA: Diagnosis not present

## 2023-11-18 DIAGNOSIS — J449 Chronic obstructive pulmonary disease, unspecified: Secondary | ICD-10-CM | POA: Diagnosis not present

## 2023-12-03 ENCOUNTER — Telehealth: Payer: Self-pay

## 2023-12-03 DIAGNOSIS — K219 Gastro-esophageal reflux disease without esophagitis: Secondary | ICD-10-CM | POA: Diagnosis not present

## 2023-12-03 DIAGNOSIS — J9610 Chronic respiratory failure, unspecified whether with hypoxia or hypercapnia: Secondary | ICD-10-CM | POA: Diagnosis not present

## 2023-12-03 DIAGNOSIS — J449 Chronic obstructive pulmonary disease, unspecified: Secondary | ICD-10-CM | POA: Diagnosis not present

## 2023-12-03 DIAGNOSIS — Z6827 Body mass index (BMI) 27.0-27.9, adult: Secondary | ICD-10-CM | POA: Diagnosis not present

## 2023-12-03 DIAGNOSIS — D649 Anemia, unspecified: Secondary | ICD-10-CM | POA: Diagnosis not present

## 2023-12-03 DIAGNOSIS — D638 Anemia in other chronic diseases classified elsewhere: Secondary | ICD-10-CM | POA: Diagnosis not present

## 2023-12-03 NOTE — Telephone Encounter (Signed)
 Dr Arlena Lacrosse would like you to see pt and discuss epoetin injections w/pt. Hgb 7.7

## 2023-12-04 DIAGNOSIS — D649 Anemia, unspecified: Secondary | ICD-10-CM | POA: Diagnosis not present

## 2023-12-04 NOTE — Telephone Encounter (Signed)
 Contacted pt to schedule an appt. Unable to reach via phone, voicemail was left.

## 2023-12-05 ENCOUNTER — Telehealth: Payer: Self-pay | Admitting: Oncology

## 2023-12-05 ENCOUNTER — Other Ambulatory Visit: Payer: Self-pay | Admitting: Oncology

## 2023-12-05 DIAGNOSIS — D649 Anemia, unspecified: Secondary | ICD-10-CM

## 2023-12-05 NOTE — Telephone Encounter (Signed)
 Patient has been scheduled. Aware of appt date and time.    Scheduling Message Entered by [place], AMY W on 12/04/2023 at 10:58 AM Priority: High EST PT 30  Department: [facility] MED ONC  Provider: Nolia Baumgartner, MD  Appointment Notes:  Please schedule lab & f/u appt, maybe within next wk or so per Dr Almer Jacobson

## 2023-12-08 DIAGNOSIS — E1121 Type 2 diabetes mellitus with diabetic nephropathy: Secondary | ICD-10-CM | POA: Diagnosis not present

## 2023-12-08 DIAGNOSIS — I5020 Unspecified systolic (congestive) heart failure: Secondary | ICD-10-CM | POA: Diagnosis not present

## 2023-12-10 NOTE — Progress Notes (Signed)
 [facility]  [address] [place],  [place]  [address] [phone]  Clinic Day:  12/12/23  Referring physician: Trinidad Hun, MD  CHIEF COMPLAINT:  CC: Stage IA hormone receptor positive right breast cancer, Anemia  Current Treatment:  Surveillance, lab evaluation  HISTORY OF PRESENT ILLNESS:  Amber [name] is a 78 y.o. 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  She was treated with a right simple mastectomy.  She had not been undergoing routine annual mammograms, then had a screening mammogram in June showing possible masses in both breasts.  Diagnostic mammogram which showed a 7 x 3 x 7 mm lobular hypoechoic mass in the right breast at the 10 o'clock position 10 cm from the nipple.  Additional bilateral oval circumscribed masses favored to represent cysts or complicated cysts were also seen.  She had a biopsy of the right breast in July, which revealed ductal carcinoma in situ partially involving a complex sclerosing lesion.  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  No atypia, in situ or invasive malignancy was identified.  She had an MRI was done in early August, which revealed a suspicious lesion in the upper inner quadrant of the right breast, as well as multiple small enhancing masses in the left breast consistent with benign lesions.  Axillary lymph nodes appeared normal.  She then had a final biopsy of the upper inner quadrant of the right breast in August.  Pathology revealed invasive ductal carcinoma, grade 1, and ductal carcinoma in situ.  Estrogen and progesterone receptors were both positive at 100% and HER2 was negative.  Ki67 was 5%.  She underwent right simple mastectomy and right axillary sentinel lymph node biopsy in September.  Three sentinel lymph nodes were negative for metastasis.   Adjuvant chemotherapy and radiation therapy were not recommended.  Bone density scan in June 2020 revealed osteopenia with a T-score of -2.1 in the left femur neck and a T-score of -1.5 in the spine, for which she is on calcium  and vitamin-D.  She was placed on anastrozole 1 mg daily in October 2020.  Left diagnostic mammogram and breast ultrasound in June revealed 3 small, benign left breast cysts, which do not need any further follow-up.  She has stage IV chronic kidney disease and is followed by a nephrologist, Dr. Jerrye. Bone density from August revealed stable to improved osteopenia with a T-score of -2.0 of the left femur neck, previously -2.1.  Dual femur total mean measures -1.7, stable.  AP Spine measures -1.5, stable. She developed severe swelling and stiffness of both hands associated with erythema and inability to use her hands. We stopped the anastrozole but it did not seem to improve. She was given two short course of corticosteroids with limited benefit and then referred to a rheumatologist. ANA and RA were negative. She takes Tylenol  arthritis and her hand issues have resolved.  She was seen back in August of 2024 and had stopped the anastrozole in February of 2023. She had a screening mammogram done on 01/14/2023 that revealed a possible asymmetry. She then had a diagnostic left breast mammogram on 01/30/2023 that revealed persistence of the asymmetry of the upper outer quadrant of the left breat. There was no palpable abnormality. Ultrasound revealed an irregular hypoechoic mass at 2 o'clock, 4 cm from the  nipple and measuring 1.0 cm, the left axilla was negative. She then had a ultrasound guided core biopsy of the mass at 2 o'clock in the left breast on 02/07/2023. Pathology revealed fibrocystic changes with usual ductal hyperplasia no atypia, in situ or invasive malignancy identified.  her B12 and folate were normal but the iron was decreased at 29 with a TIBC for a low saturation of 11.8%  and ferritin was 55.5. She tells me she is scheduled to receive IV iron tomorrow but did not know the type or how much. Her hemoglobin today is 8.6 with an MCV of 84. I will do a further evaluation for  INTERVAL HISTORY:  Amber [name] is now here for follow up of worsening anemia. Her hemoglobin was 11.0 in August of 2024 but decreased to 7.7 on 12/03/23 and is referred for further evaluation and treatment. She had been anemic in the past with both iron and B12 deficiency and was placed on supplements of both. She has known chronic kidney disease and her creatinine is relatively stable at 1.82 with an eGFR of 28. She is followed by her nephrologist, Dr. Jerrye. Her total protein was low at 5.4 with normal albumin and decreased globulin of 1.4. Dr. 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  However, while the patient is here, she notes increased dyspnea, especially with any exertion. She denies chest pain but is sore from coughing so much, occasionally productive of white sputum but no hemoptysis. Her WBC's are elevated at 13.5 with 78% neutrophils, 10% lymphocytes and 9% monocytes. I will try to contact Dr. Trinidad but will proceed with a chest x-ray today and place her on Zithromax  500 mg daily for 10 days and Tessalon  perles 200 mg TID as needed for cough. I will see her back in 3 weeks with CBC, CMP, iron/TIBC and ferritin to re-assess her anemia and response to the IV iron as well as follow up on her respiratory infection.  I will also plan to perform her annual breast exam at that time.  She denies fever, chills, night sweats, or other signs of infection. She denies urinary or gastrointestinal issues. She  denies pain. Her appetite is good and Her weight has decreased 10 pounds over last 3 months.  REVIEW OF SYSTEMS:  Review of Systems  Constitutional: Negative.  Negative for appetite change, chills, diaphoresis, fatigue, fever  and unexpected weight change.  HENT:  Negative.  Negative for hearing loss, lump/mass, mouth sores, nosebleeds, sore throat, tinnitus, trouble swallowing and voice change.   Eyes: Negative.  Negative for eye problems and icterus.  Respiratory: Negative.  Negative for chest tightness, cough, hemoptysis, shortness of breath and wheezing.   Cardiovascular: Negative.  Negative for chest pain, leg swelling and palpitations.  Gastrointestinal: Negative.  Negative for abdominal distention, abdominal pain, blood in stool, constipation, diarrhea, nausea, rectal pain and vomiting.  Endocrine: Negative.   Genitourinary: Negative.  Negative for bladder incontinence, difficulty urinating, dyspareunia, dysuria, frequency, hematuria, menstrual problem, nocturia, pelvic pain, vaginal bleeding and vaginal discharge.   Musculoskeletal: Negative.  Negative for arthralgias, back pain, flank pain, gait problem, myalgias, neck pain and neck stiffness.  Skin: Negative.  Negative for itching, rash and wound.  Neurological:  Negative for dizziness, extremity weakness, gait problem, headaches, light-headedness, numbness (and paresthesias of the bilateral hands), seizures and speech difficulty.  Hematological: Negative.  Negative for adenopathy. Does not bruise/bleed easily.  Psychiatric/Behavioral: Negative.  Negative  for confusion, decreased concentration, depression, sleep disturbance and suicidal ideas. The patient is not nervous/anxious.      VITALS:  Blood pressure (!) 160/79, pulse 80, resp. rate (!) 22, height 5' 5.6 (1.666 m), weight 168 lb 4.8 oz (76.3 kg), SpO2 93%.  Wt Readings from Last 3 Encounters:  12/12/23 168 lb 4.8 oz (76.3 kg)  09/24/23 178 lb 12.8 oz (81.1 kg)  06/25/23 184 lb (83.5 kg)    Body mass index is 27.5 kg/m.  Performance status (ECOG): 1 - Symptomatic but completely ambulatory  PHYSICAL EXAM:  Physical Exam Constitutional:      General: She is not in acute distress.     Appearance: Normal appearance. She is normal weight. She is not ill-appearing, toxic-appearing or diaphoretic.  HENT:     Head: Normocephalic and atraumatic.     Right Ear: Tympanic membrane, ear canal and external ear normal. There is no impacted cerumen.     Left Ear: Tympanic membrane, ear canal and external ear normal. There is no impacted cerumen.     Nose: Nose normal. No congestion or rhinorrhea.     Mouth/Throat:     Mouth: Mucous membranes are moist.     Pharynx: Oropharynx is clear. No oropharyngeal exudate or posterior oropharyngeal erythema.   Eyes:     General: No scleral icterus.       Right eye: No discharge.        Left eye: No discharge.     Extraocular Movements: Extraocular movements intact.     Conjunctiva/sclera: Conjunctivae normal.     Pupils: Pupils are equal, round, and reactive to light.   Neck:     Vascular: No carotid bruit.   Cardiovascular:     Rate and Rhythm: Normal rate and regular rhythm.     Pulses: Normal pulses.     Heart sounds: Normal heart sounds. No murmur heard.    No friction rub. No gallop.  Pulmonary:     Effort: Pulmonary effort is normal. No respiratory distress.     Breath sounds: Normal breath sounds. No stridor. No wheezing, rhonchi or rales.  Chest:     Chest wall: No tenderness.     Comments: Moderate fibrocystic changes in the left breast but no masses.  Right mastectomy is negative. Abdominal:     General: Bowel sounds are normal. There is no distension.     Palpations: Abdomen is soft. There is no hepatomegaly, splenomegaly or mass.     Tenderness: There is no abdominal tenderness. There is no right CVA tenderness, left CVA tenderness, guarding or rebound.     Hernia: No hernia is present.   Musculoskeletal:        General: No swelling, tenderness, deformity or signs of injury. Normal range of motion.     Right hand: No swelling.     Left hand: No swelling.     Cervical back: Normal range of motion and neck supple. No  rigidity or tenderness.     Right lower leg: No edema.     Left lower leg: No edema.  Lymphadenopathy:     Cervical: No cervical adenopathy.     Right cervical: No superficial, deep or posterior cervical adenopathy.    Left cervical: No superficial, deep or posterior cervical adenopathy.     Upper Body:     Right upper body: No supraclavicular, axillary or pectoral adenopathy.     Left upper body: No supraclavicular, axillary or pectoral adenopathy.   Skin:  General: Skin is warm and dry.     Coloration: Skin is not jaundiced or pale.     Findings: No bruising, erythema, lesion or rash.   Neurological:     General: No focal deficit present.     Mental Status: She is alert and oriented to person, place, and time. Mental status is at baseline.     Cranial Nerves: No cranial nerve deficit.     Sensory: No sensory deficit.     Motor: No weakness.     Coordination: Coordination normal.     Gait: Gait normal.     Deep Tendon Reflexes: Reflexes normal.   Psychiatric:        Mood and Affect: Mood normal.        Behavior: Behavior normal.        Thought Content: Thought content normal.        Judgment: Judgment normal.   LABS:      Latest Ref Rng & Units 12/12/2023    2:54 PM 05/12/2023    6:04 AM 05/11/2023    2:39 AM  CBC  WBC 4.0 - 10.5 K/uL 13.5  9.7  10.9   Hemoglobin 12.0 - 15.0 g/dL 8.6  9.1  8.6   Hematocrit 36.0 - 46.0 % 29.0  28.1  27.3   Platelets 150 - 400 K/uL 335  217  219       Latest Ref Rng & Units 12/12/2023    2:54 PM 05/22/2023   10:58 AM 05/12/2023    6:04 AM  CMP  Glucose 70 - 99 mg/dL 876   830   BUN 8 - 23 mg/dL 18   21   Creatinine 9.55 - 1.00 mg/dL 8.19   8.26   Sodium 864 - 145 mmol/L 142   140   Potassium 3.5 - 5.1 mmol/L 3.7   3.8   Chloride 98 - 111 mmol/L 106   106   CO2 22 - 32 mmol/L 23   20   Calcium  8.9 - 10.3 mg/dL 9.5   8.9   Total Protein 6.5 - 8.1 g/dL 6.1  5.9    Total Bilirubin 0.0 - 1.2 mg/dL 0.3  0.6    Alkaline Phos 38 - 126  U/L 119  95    AST 15 - 41 U/L 8  12    ALT 0 - 44 U/L <5  13     STUDIES:  No results found.     Allergies:  Allergies  Allergen Reactions   Advil [Ibuprofen] Other (See Comments)    Intolerance due to kidney problem   Aleve [Naproxen] Other (See Comments)    Intolerance due to kidney problem    Current Medications: Current Outpatient Medications  Medication Sig Dispense Refill   acetaminophen  (TYLENOL ) 650 MG CR tablet Take 650 mg by mouth every 8 (eight) hours as needed for pain.     albuterol  (VENTOLIN  HFA) 108 (90 Base) MCG/ACT inhaler Inhale 2 puffs into the lungs every 4 (four) hours as needed for wheezing or shortness of breath.     aspirin  81 MG EC tablet Take 81 mg by mouth daily.     atorvastatin  (LIPITOR) 40 MG tablet Take 1 tablet (40 mg total) by mouth daily. 90 tablet 3   carvedilol  (COREG ) 12.5 MG tablet Take 1 tablet (12.5 mg total) by mouth 2 (two) times daily. 180 tablet 3   Cholecalciferol  125 MCG (5000 UT) capsule Take 5,000 Units by mouth daily.  clopidogrel  (PLAVIX ) 75 MG tablet Take 1 tablet (75 mg total) by mouth daily. 90 tablet 3   doxazosin (CARDURA) 2 MG tablet Take 2 mg by mouth daily.     esomeprazole (NEXIUM) 40 MG capsule Take 40 mg by mouth daily.     ferrous sulfate 325 (65 FE) MG EC tablet Take 325 mg by mouth 3 (three) times daily with meals.     fluticasone  furoate-vilanterol (BREO ELLIPTA ) 100-25 MCG/INH AEPB Inhale 1 puff into the lungs daily.     furosemide  (LASIX ) 40 MG tablet Take 40 mg by mouth daily.     glimepiride (AMARYL) 2 MG tablet Take 2 mg by mouth daily with breakfast.     hydrALAZINE  (APRESOLINE ) 50 MG tablet Take 1.5 tablets (75 mg total) by mouth 3 (three) times daily. 405 tablet 1   losartan  (COZAAR ) 100 MG tablet Take 100 mg by mouth daily.     potassium chloride  (KLOR-CON ) 10 MEQ tablet Take 10 mEq by mouth 2 (two) times daily.     sodium bicarbonate  650 MG tablet Take 1,300 mg by mouth daily as needed for heartburn.      benzonatate  (TESSALON ) 200 MG capsule Take 1 capsule (200 mg total) by mouth 3 (three) times daily as needed for cough. 20 capsule 0   nitroGLYCERIN  (NITROSTAT ) 0.4 MG SL tablet Place 0.4 mg under the tongue every 5 (five) minutes as needed for chest pain.     No current facility-administered medications for this visit.    ASSESSMENT & PLAN:  Assessment:   Stage IA hormone receptor positive breast cancer (T1a N0 M0), diagnosed in July 2020.  She remains without evidence of recurrence.  Anastrozole was stopped in February of 2023. Fortunately she had a tiny breast cancer and is at low risk for recurrence.    2.  Osteopenia, for which she takes calcium /vitamin D .  Her last bone density scan was in August 2022.  3.  Acute bronchitis vs pneumonia with leukocytosis, increased dyspnea and severe cough.  I will obtain a chest x-ray to rule out pneumonia or other pathology. I will prescribe Zithromax  and Tessalon  perles and will contact Dr. Trinidad about this.  I will re-evaluate in 3 weeks.  4. Anemia, worse again. Her B12 and folate were normal but the iron was decreased at 29 with a TIBC for a low saturation of 11.8% and ferritin was 55.5. She tells me she is scheduled to receive IV iron tomorrow. Her hemoglobin today is 8.6 with an MCV of 84. I will do a further evaluation for hemolytic anemia or multiple myeloma, and will recheck her in 3 weeks to assess her response to the IV iron. Certainly some of her anemia is from her chronic kidney disease. She may need further GI evaluation for blood loss.  5. Renal insufficiency, stable. She continues to follow with her nephrologist, Dr. Katheryn Saba.  6. Fibrocystic changes and especially prominent in the upper outer quadrant of the left breast. Biopsy in August of 2024 confirmed this to be benign.    Plan: Amber [name] is now here for follow up of worsening anemia. Her hemoglobin was 11.0 in August of 2024 but decreased to 7.7 on 12/03/23 and is referred for  further evaluation and treatment. She had been anemic in the past with both iron and B12 deficiency and was placed on supplements of both. She has known chronic kidney disease and her creatinine is relatively stable at 1.82 with an eGFR of 28. She is followed by  her nephrologist, Dr. Jerrye. Her total protein was low at 5.4 with normal albumin and decreased globulin of 1.4. Dr. 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  However, while the patient is here, she notes increased dyspnea, especially with any exertion. She denies chest pain but is sore from coughing so much, occasionally productive of white sputum but no hemoptysis. Her WBC's are elevated at 13.5 with 78% neutrophils, 10% lymphocytes and 9% monocytes. I will try to contact Dr. Trinidad but will proceed with a chest x-ray today and place her on Zithromax  500 mg daily for 10 days and Tessalon  perles 200 mg TID as needed for cough. I will see her back in 3 weeks with CBC, CMP, iron/TIBC and ferritin to re-assess her anemia and response to the IV iron as well as follow up on her respiratory infection.  I will also plan to perform her annual breast exam at that time. The patient understands the plans discussed today and is in agreement with them. I advised her to contact Dr. Trinidad if her respiratory symptoms worsen. She knows to contact our office if she develops concerns regarding her breast cancer.  I provided 26 minutes of face-to-face time during this this encounter and > 50% was spent counseling as documented under my assessment and plan.   Wanda VEAR Cornish, MD  [facility] - A DEPT OF [facility] [address] [place] [address] Dept: [phone] Dept Fax: [phone]   No orders of the defined types were placed in this encounter.   I,Jasmine M Lassiter,acting as a scribe for Wanda VEAR Cornish,  MD.,have documented all relevant documentation on the behalf of Wanda VEAR Cornish, MD,as directed by  Wanda VEAR Cornish, MD while in the presence of Wanda VEAR Cornish, MD.   I have reviewed this report as typed by the medical scribe, and it is complete and accurate.

## 2023-12-11 ENCOUNTER — Other Ambulatory Visit: Payer: Self-pay | Admitting: Oncology

## 2023-12-11 DIAGNOSIS — D649 Anemia, unspecified: Secondary | ICD-10-CM

## 2023-12-12 ENCOUNTER — Inpatient Hospital Stay (HOSPITAL_BASED_OUTPATIENT_CLINIC_OR_DEPARTMENT_OTHER): Admitting: Oncology

## 2023-12-12 ENCOUNTER — Inpatient Hospital Stay: Attending: Oncology

## 2023-12-12 ENCOUNTER — Other Ambulatory Visit: Payer: Self-pay | Admitting: Oncology

## 2023-12-12 ENCOUNTER — Encounter: Payer: Self-pay | Admitting: Oncology

## 2023-12-12 VITALS — BP 160/79 | HR 80 | Resp 22 | Ht 65.6 in | Wt 168.3 lb

## 2023-12-12 DIAGNOSIS — D649 Anemia, unspecified: Secondary | ICD-10-CM

## 2023-12-12 DIAGNOSIS — R059 Cough, unspecified: Secondary | ICD-10-CM | POA: Insufficient documentation

## 2023-12-12 DIAGNOSIS — C50211 Malignant neoplasm of upper-inner quadrant of right female breast: Secondary | ICD-10-CM

## 2023-12-12 DIAGNOSIS — R0600 Dyspnea, unspecified: Secondary | ICD-10-CM | POA: Insufficient documentation

## 2023-12-12 DIAGNOSIS — N289 Disorder of kidney and ureter, unspecified: Secondary | ICD-10-CM | POA: Insufficient documentation

## 2023-12-12 DIAGNOSIS — J209 Acute bronchitis, unspecified: Secondary | ICD-10-CM

## 2023-12-12 DIAGNOSIS — Z853 Personal history of malignant neoplasm of breast: Secondary | ICD-10-CM | POA: Insufficient documentation

## 2023-12-12 DIAGNOSIS — M8580 Other specified disorders of bone density and structure, unspecified site: Secondary | ICD-10-CM | POA: Diagnosis not present

## 2023-12-12 LAB — CMP (CANCER CENTER ONLY)
ALT: <5 U/L (ref 0–44)
AST: 8 U/L — ABNORMAL LOW (ref 15–41)
Albumin: 3.5 g/dL (ref 3.5–5.0)
Alkaline Phosphatase: 119 U/L (ref 38–126)
Anion gap: 13 (ref 5–15)
BUN: 18 mg/dL (ref 8–23)
CO2: 23 mmol/L (ref 22–32)
Calcium: 9.5 mg/dL (ref 8.9–10.3)
Chloride: 106 mmol/L (ref 98–111)
Creatinine: 1.8 mg/dL — ABNORMAL HIGH (ref 0.44–1.00)
GFR, Estimated: 28 mL/min — ABNORMAL LOW (ref >60)
Glucose, Bld: 123 mg/dL — ABNORMAL HIGH (ref 70–99)
Potassium: 3.7 mmol/L (ref 3.5–5.1)
Sodium: 142 mmol/L (ref 135–145)
Total Bilirubin: 0.3 mg/dL (ref 0.0–1.2)
Total Protein: 6.1 g/dL — ABNORMAL LOW (ref 6.5–8.1)

## 2023-12-12 LAB — CBC WITH DIFFERENTIAL (CANCER CENTER ONLY)
Abs Immature Granulocytes: 0.16 10*3/uL — ABNORMAL HIGH (ref 0.00–0.07)
Basophils Absolute: 0.1 10*3/uL (ref 0.0–0.1)
Basophils Relative: 1 %
Eosinophils Absolute: 0.2 10*3/uL (ref 0.0–0.5)
Eosinophils Relative: 1 %
HCT: 29 % — ABNORMAL LOW (ref 36.0–46.0)
Hemoglobin: 8.6 g/dL — ABNORMAL LOW (ref 12.0–15.0)
Immature Granulocytes: 1 %
Lymphocytes Relative: 10 %
Lymphs Abs: 1.4 10*3/uL (ref 0.7–4.0)
MCH: 24.9 pg — ABNORMAL LOW (ref 26.0–34.0)
MCHC: 29.7 g/dL — ABNORMAL LOW (ref 30.0–36.0)
MCV: 84.1 fL (ref 80.0–100.0)
Monocytes Absolute: 1.2 10*3/uL — ABNORMAL HIGH (ref 0.1–1.0)
Monocytes Relative: 9 %
Neutro Abs: 10.5 10*3/uL — ABNORMAL HIGH (ref 1.7–7.7)
Neutrophils Relative %: 78 %
Platelet Count: 335 10*3/uL (ref 150–400)
RBC: 3.45 MIL/uL — ABNORMAL LOW (ref 3.87–5.11)
RDW: 17.1 % — ABNORMAL HIGH (ref 11.5–15.5)
WBC Count: 13.5 10*3/uL — ABNORMAL HIGH (ref 4.0–10.5)
nRBC: 0 % (ref 0.0–0.2)

## 2023-12-12 LAB — SAMPLE TO BLOOD BANK

## 2023-12-12 LAB — IRON AND TIBC
Iron: 17 ug/dL — ABNORMAL LOW (ref 28–170)
Saturation Ratios: 7 % — ABNORMAL LOW (ref 10.4–31.8)
TIBC: 258 ug/dL (ref 250–450)
UIBC: 241 ug/dL

## 2023-12-12 LAB — LACTATE DEHYDROGENASE: LDH: 178 U/L (ref 98–192)

## 2023-12-12 LAB — RETICULOCYTES
Immature Retic Fract: 20.9 % — ABNORMAL HIGH (ref 2.3–15.9)
RBC.: 3.39 MIL/uL — ABNORMAL LOW (ref 3.87–5.11)
Retic Count, Absolute: 103.1 10*3/uL (ref 19.0–186.0)
Retic Ct Pct: 3 % (ref 0.4–3.1)

## 2023-12-12 LAB — FERRITIN: Ferritin: 44 ng/mL (ref 11–307)

## 2023-12-12 MED ORDER — BENZONATATE 200 MG PO CAPS: 200.0000 mg | ORAL_CAPSULE | Freq: Three times a day (TID) | ORAL | 0 refills | Status: AC | PRN

## 2023-12-12 MED ORDER — AZITHROMYCIN 500 MG PO TABS: 500.0000 mg | ORAL_TABLET | Freq: Every day | ORAL | 0 refills | Status: AC

## 2023-12-13 DIAGNOSIS — N189 Chronic kidney disease, unspecified: Secondary | ICD-10-CM | POA: Diagnosis not present

## 2023-12-13 DIAGNOSIS — D631 Anemia in chronic kidney disease: Secondary | ICD-10-CM | POA: Diagnosis not present

## 2023-12-13 LAB — SOLUBLE TRANSFERRIN RECEPTOR: Transferrin Receptor: 49.2 nmol/L — ABNORMAL HIGH (ref 12.2–27.3)

## 2023-12-13 LAB — HAPTOGLOBIN: Haptoglobin: 249 mg/dL (ref 42–346)

## 2023-12-13 LAB — ERYTHROPOIETIN: Erythropoietin: 45 m[IU]/mL — ABNORMAL HIGH (ref 2.6–18.5)

## 2023-12-16 LAB — PROTEIN ELECTROPHORESIS, SERUM
A/G Ratio: 1 (ref 0.7–1.7)
Albumin ELP: 2.8 g/dL — ABNORMAL LOW (ref 2.9–4.4)
Alpha-1-Globulin: 0.4 g/dL (ref 0.0–0.4)
Alpha-2-Globulin: 0.9 g/dL (ref 0.4–1.0)
Beta Globulin: 0.8 g/dL (ref 0.7–1.3)
Gamma Globulin: 0.8 g/dL (ref 0.4–1.8)
Globulin, Total: 2.8 g/dL (ref 2.2–3.9)
Total Protein ELP: 5.6 g/dL — ABNORMAL LOW (ref 6.0–8.5)

## 2023-12-17 DIAGNOSIS — N184 Chronic kidney disease, stage 4 (severe): Secondary | ICD-10-CM | POA: Diagnosis not present

## 2023-12-25 DIAGNOSIS — E876 Hypokalemia: Secondary | ICD-10-CM | POA: Diagnosis not present

## 2023-12-25 DIAGNOSIS — E1122 Type 2 diabetes mellitus with diabetic chronic kidney disease: Secondary | ICD-10-CM | POA: Diagnosis not present

## 2023-12-25 DIAGNOSIS — N184 Chronic kidney disease, stage 4 (severe): Secondary | ICD-10-CM | POA: Diagnosis not present

## 2023-12-25 DIAGNOSIS — D631 Anemia in chronic kidney disease: Secondary | ICD-10-CM | POA: Diagnosis not present

## 2023-12-25 DIAGNOSIS — E872 Acidosis, unspecified: Secondary | ICD-10-CM | POA: Diagnosis not present

## 2023-12-25 DIAGNOSIS — I129 Hypertensive chronic kidney disease with stage 1 through stage 4 chronic kidney disease, or unspecified chronic kidney disease: Secondary | ICD-10-CM | POA: Diagnosis not present

## 2023-12-25 DIAGNOSIS — N261 Atrophy of kidney (terminal): Secondary | ICD-10-CM | POA: Diagnosis not present

## 2023-12-25 DIAGNOSIS — R809 Proteinuria, unspecified: Secondary | ICD-10-CM | POA: Diagnosis not present

## 2023-12-30 ENCOUNTER — Telehealth: Payer: Self-pay

## 2023-12-30 DIAGNOSIS — N184 Chronic kidney disease, stage 4 (severe): Secondary | ICD-10-CM | POA: Diagnosis not present

## 2023-12-30 DIAGNOSIS — D631 Anemia in chronic kidney disease: Secondary | ICD-10-CM | POA: Diagnosis not present

## 2023-12-30 NOTE — Telephone Encounter (Signed)
-----   Message from Wanda VEAR Cornish sent at 12/28/2023 11:40 AM EDT ----- Regarding: CXR I had ordered chest x-ray when I saw her 6/5 - did she have it done at [facility]?

## 2023-12-30 NOTE — Telephone Encounter (Signed)
 I do not see where she has had a chest x-ray done in [facility] nor [facility]

## 2024-01-02 ENCOUNTER — Other Ambulatory Visit: Payer: Self-pay | Admitting: Oncology

## 2024-01-02 ENCOUNTER — Inpatient Hospital Stay (HOSPITAL_BASED_OUTPATIENT_CLINIC_OR_DEPARTMENT_OTHER): Admitting: Oncology

## 2024-01-02 ENCOUNTER — Inpatient Hospital Stay

## 2024-01-02 ENCOUNTER — Telehealth: Payer: Self-pay | Admitting: Oncology

## 2024-01-02 ENCOUNTER — Encounter: Payer: Self-pay | Admitting: Oncology

## 2024-01-02 VITALS — BP 140/55 | HR 68 | Temp 97.7°F | Resp 20 | Ht 65.6 in | Wt 159.6 lb

## 2024-01-02 DIAGNOSIS — N289 Disorder of kidney and ureter, unspecified: Secondary | ICD-10-CM | POA: Diagnosis not present

## 2024-01-02 DIAGNOSIS — D649 Anemia, unspecified: Secondary | ICD-10-CM

## 2024-01-02 DIAGNOSIS — R059 Cough, unspecified: Secondary | ICD-10-CM | POA: Diagnosis not present

## 2024-01-02 DIAGNOSIS — C50211 Malignant neoplasm of upper-inner quadrant of right female breast: Secondary | ICD-10-CM

## 2024-01-02 DIAGNOSIS — D509 Iron deficiency anemia, unspecified: Secondary | ICD-10-CM | POA: Diagnosis not present

## 2024-01-02 DIAGNOSIS — R0600 Dyspnea, unspecified: Secondary | ICD-10-CM | POA: Diagnosis not present

## 2024-01-02 DIAGNOSIS — Z853 Personal history of malignant neoplasm of breast: Secondary | ICD-10-CM | POA: Diagnosis not present

## 2024-01-02 DIAGNOSIS — M8580 Other specified disorders of bone density and structure, unspecified site: Secondary | ICD-10-CM | POA: Diagnosis not present

## 2024-01-02 LAB — CMP (CANCER CENTER ONLY)
ALT: 5 U/L (ref 0–44)
AST: 14 U/L — ABNORMAL LOW (ref 15–41)
Albumin: 4 g/dL (ref 3.5–5.0)
Alkaline Phosphatase: 105 U/L (ref 38–126)
Anion gap: 13 (ref 5–15)
BUN: 33 mg/dL — ABNORMAL HIGH (ref 8–23)
CO2: 27 mmol/L (ref 22–32)
Calcium: 9.7 mg/dL (ref 8.9–10.3)
Chloride: 101 mmol/L (ref 98–111)
Creatinine: 2.34 mg/dL — ABNORMAL HIGH (ref 0.44–1.00)
GFR, Estimated: 20 mL/min — ABNORMAL LOW (ref >60)
Glucose, Bld: 147 mg/dL — ABNORMAL HIGH (ref 70–99)
Potassium: 3.6 mmol/L (ref 3.5–5.1)
Sodium: 140 mmol/L (ref 135–145)
Total Bilirubin: 0.3 mg/dL (ref 0.0–1.2)
Total Protein: 6.7 g/dL (ref 6.5–8.1)

## 2024-01-02 LAB — CBC WITH DIFFERENTIAL (CANCER CENTER ONLY)
Abs Immature Granulocytes: 0.05 10*3/uL (ref 0.00–0.07)
Basophils Absolute: 0.1 10*3/uL (ref 0.0–0.1)
Basophils Relative: 1 %
Eosinophils Absolute: 0.3 10*3/uL (ref 0.0–0.5)
Eosinophils Relative: 2 %
HCT: 32.4 % — ABNORMAL LOW (ref 36.0–46.0)
Hemoglobin: 9.9 g/dL — ABNORMAL LOW (ref 12.0–15.0)
Immature Granulocytes: 0 %
Lymphocytes Relative: 11 %
Lymphs Abs: 1.4 10*3/uL (ref 0.7–4.0)
MCH: 26.1 pg (ref 26.0–34.0)
MCHC: 30.6 g/dL (ref 30.0–36.0)
MCV: 85.5 fL (ref 80.0–100.0)
Monocytes Absolute: 0.8 10*3/uL (ref 0.1–1.0)
Monocytes Relative: 6 %
Neutro Abs: 9.6 10*3/uL — ABNORMAL HIGH (ref 1.7–7.7)
Neutrophils Relative %: 80 %
Platelet Count: 251 10*3/uL (ref 150–400)
RBC: 3.79 MIL/uL — ABNORMAL LOW (ref 3.87–5.11)
RDW: 17.7 % — ABNORMAL HIGH (ref 11.5–15.5)
WBC Count: 12.2 10*3/uL — ABNORMAL HIGH (ref 4.0–10.5)
nRBC: 0 % (ref 0.0–0.2)

## 2024-01-02 LAB — IRON AND TIBC
Iron: 116 ug/dL (ref 28–170)
Saturation Ratios: 34 % — ABNORMAL HIGH (ref 10.4–31.8)
TIBC: 342 ug/dL (ref 250–450)
UIBC: 226 ug/dL

## 2024-01-02 LAB — FERRITIN: Ferritin: 38 ng/mL (ref 11–307)

## 2024-01-02 NOTE — Progress Notes (Signed)
 [facility]  [address] [place],  [place]  [address] [phone]  Clinic Day:  01/02/24  Referring physician: Trinidad Hun, MD  CHIEF COMPLAINT:  CC: Stage IA hormone receptor positive right breast cancer, Anemia  Current Treatment:  Surveillance, lab evaluation  HISTORY OF PRESENT ILLNESS:  Amber [name] is a 78 y.o. 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  She was treated with a right simple mastectomy.  She had not been undergoing routine annual mammograms, then had a screening mammogram in June showing possible masses in both breasts.  Diagnostic mammogram which showed a 7 x 3 x 7 mm lobular hypoechoic mass in the right breast at the 10 o'clock position 10 cm from the nipple.  Additional bilateral oval circumscribed masses favored to represent cysts or complicated cysts were also seen.  She had a biopsy of the right breast in July, which revealed ductal carcinoma in situ partially involving a complex sclerosing lesion.  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  No atypia, in situ or invasive malignancy was identified.  She had an MRI was done in early August, which revealed a suspicious lesion in the upper inner quadrant of the right breast, as well as multiple small enhancing masses in the left breast consistent with benign lesions.  Axillary lymph nodes appeared normal.  She then had a final biopsy of the upper inner quadrant of the right breast in August.  Pathology revealed invasive ductal carcinoma, grade 1, and ductal carcinoma in situ.  Estrogen and progesterone receptors were both positive at 100% and HER2 was negative.  Ki67 was 5%.  She underwent right simple mastectomy and right axillary sentinel lymph node biopsy in September.  Three sentinel lymph nodes were negative for metastasis.   Adjuvant chemotherapy and radiation therapy were not recommended.  Bone density scan in June 2020 revealed osteopenia with a T-score of -2.1 in the left femur neck and a T-score of -1.5 in the spine, for which she is on calcium  and vitamin-D.  She was placed on anastrozole 1 mg daily in October 2020.  Left diagnostic mammogram and breast ultrasound in June revealed 3 small, benign left breast cysts, which do not need any further follow-up.  She has stage IV chronic kidney disease and is followed by a nephrologist, Dr. Jerrye. Bone density from August revealed stable to improved osteopenia with a T-score of -2.0 of the left femur neck, previously -2.1.  Dual femur total mean measures -1.7, stable.  AP Spine measures -1.5, stable. She developed severe swelling and stiffness of both hands associated with erythema and inability to use her hands. We stopped the anastrozole but it did not seem to improve. She was given two short course of corticosteroids with limited benefit and then referred to a rheumatologist. ANA and RA were negative. She takes Tylenol  arthritis and her hand issues have resolved.  She was seen back in August of 2024 and had stopped the anastrozole in February of 2023. She had a screening mammogram done on 01/14/2023 that revealed a possible asymmetry. She then had a diagnostic left breast mammogram on 01/30/2023 that revealed persistence of the asymmetry of the upper outer quadrant of the left breat. There was no palpable abnormality. Ultrasound revealed an irregular hypoechoic mass at 2 o'clock, 4 cm from the nipple and  measuring 1.0 cm, the left axilla was negative. She then had a ultrasound guided core biopsy of the mass at 2 o'clock in the left breast on 02/07/2023. Pathology revealed fibrocystic changes with usual ductal hyperplasia no atypia, in situ or invasive malignancy identified. Her B12 and folate were normal but the iron was decreased at 29 with a TIBC for a low saturation of 11.8% and  ferritin was 55.5. She tells me she is scheduled to receive IV iron tomorrow but did not know the type or how much. Her hemoglobin today is 8.6 with an MCV of 84. I will do a further evaluation for other causes.  INTERVAL HISTORY:  Amber [name] is now here for follow up of her stage IA hormone receptor positive right breast cancer and worsening anemia. Patient states that she feels ok but complains of severe shortness of breath, she states that she cannot walk short distances or even go to the bathroom without using her oxygen . When I saw her 3 weeks ago she had a respiratory infection with increased cough and leukocytosis but chest X-ray did not show pneumonia. I placed her on antibiotics and her cough is improved and she feels slightly better but is still extremely dyspneic. Patient was found to be iron deficient on 12/12/2023 with an iron saturation of 7% so she received IV Feraheme at [facility]. I held off on ordering the procrit  as this will not be reimbursed if she is iron deficient. She informed me that she has now started Procrit  injections on 12/30/2023 and her next injection is scheduled on July 7th. She will receive this every 2 weeks at [facility] and the nephrologist is managing this and checking her CBC's regularly. She will see her again July 18th so I will hold off on follow-up but will need to check her iron stores periodically. She has a elevated WBC of 12.2, low hemoglobin of 9.9 improved from 8.6, and platelet count 251,000. Her CMP is fairly normal other than a elevated creatinine of 2.34 with a BUN of 33. She also had a SPEP panel done and no M-spike was observed. I will see her back in 3 months with CBC, CMP, ferritin, iron, and TIBC. She denies fever, chills, night sweats, or other signs of infection. She denies gastrointestinal issues. She  denies pain. Her appetite is good and Her weight has decreased 9 pounds over last 3 weeks.   REVIEW OF SYSTEMS:  Review of Systems   Constitutional: Negative.  Negative for appetite change, chills, diaphoresis, fatigue, fever and unexpected weight change.  HENT:  Negative.  Negative for hearing loss, lump/mass, mouth sores, nosebleeds, sore throat, tinnitus, trouble swallowing and voice change.   Eyes: Negative.  Negative for eye problems and icterus.  Respiratory:  Positive for shortness of breath (severe, on oxygen ). Negative for chest tightness, cough, hemoptysis and wheezing.   Cardiovascular: Negative.  Negative for chest pain, leg swelling and palpitations.  Gastrointestinal: Negative.  Negative for abdominal distention, abdominal pain, blood in stool, constipation, diarrhea, nausea, rectal pain and vomiting.  Endocrine: Negative.   Genitourinary: Negative.  Negative for bladder incontinence, difficulty urinating, dyspareunia, dysuria, frequency, hematuria, menstrual problem, nocturia, pelvic pain, vaginal bleeding and vaginal discharge.   Musculoskeletal:  Negative for arthralgias, back pain, flank pain, gait problem, myalgias, neck pain and neck stiffness.  Skin: Negative.  Negative for itching, rash and wound.  Neurological:  Positive for extremity weakness and numbness (and paresthesias of the bilateral hands). Negative for dizziness, gait problem, headaches, light-headedness,  seizures and speech difficulty.  Hematological: Negative.  Negative for adenopathy. Does not bruise/bleed easily.  Psychiatric/Behavioral: Negative.  Negative for confusion, decreased concentration, depression, sleep disturbance and suicidal ideas. The patient is not nervous/anxious.     VITALS:  Blood pressure (!) 140/55, pulse 68, temperature 97.7 F (36.5 C), temperature source Oral, resp. rate 20, height 5' 5.6 (1.666 m), weight 159 lb 9.6 oz (72.4 kg), SpO2 94%.  Wt Readings from Last 3 Encounters:  01/02/24 159 lb 9.6 oz (72.4 kg)  12/12/23 168 lb 4.8 oz (76.3 kg)  09/24/23 178 lb 12.8 oz (81.1 kg)    Body mass index is 26.08  kg/m.  Performance status (ECOG): 2 - Symptomatic, <50% confined to bed  PHYSICAL EXAM:  Physical Exam Constitutional:      General: She is not in acute distress.    Appearance: Normal appearance. She is normal weight. She is not ill-appearing, toxic-appearing or diaphoretic.  HENT:     Head: Normocephalic and atraumatic.     Right Ear: Tympanic membrane, ear canal and external ear normal. There is no impacted cerumen.     Left Ear: Tympanic membrane, ear canal and external ear normal. There is no impacted cerumen.     Nose: Nose normal. No congestion or rhinorrhea.     Mouth/Throat:     Mouth: Mucous membranes are moist.     Pharynx: Oropharynx is clear. No oropharyngeal exudate or posterior oropharyngeal erythema.  Eyes:     General: No scleral icterus.       Right eye: No discharge.        Left eye: No discharge.     Extraocular Movements: Extraocular movements intact.     Conjunctiva/sclera: Conjunctivae normal.     Pupils: Pupils are equal, round, and reactive to light.  Neck:     Vascular: No carotid bruit.  Cardiovascular:     Rate and Rhythm: Normal rate and regular rhythm.     Pulses: Normal pulses.     Heart sounds: Normal heart sounds. No murmur heard.    No friction rub. No gallop.  Pulmonary:     Effort: Pulmonary effort is normal. No respiratory distress.     Breath sounds: Normal breath sounds. No stridor. No wheezing, rhonchi or rales.  Chest:     Chest wall: No tenderness.     Comments: Moderate fibrocystic changes in the left breast but no masses.  Right mastectomy is negative. Abdominal:     General: Bowel sounds are normal. There is no distension.     Palpations: Abdomen is soft. There is no hepatomegaly, splenomegaly or mass.     Tenderness: There is no abdominal tenderness. There is no right CVA tenderness, left CVA tenderness, guarding or rebound.     Hernia: No hernia is present.  Musculoskeletal:        General: No swelling, tenderness, deformity  or signs of injury. Normal range of motion.     Right hand: No swelling.     Left hand: No swelling.     Cervical back: Normal range of motion and neck supple. No rigidity or tenderness.     Right lower leg: No edema.     Left lower leg: No edema.  Lymphadenopathy:     Cervical: No cervical adenopathy.     Right cervical: No superficial, deep or posterior cervical adenopathy.    Left cervical: No superficial, deep or posterior cervical adenopathy.     Upper Body:     Right upper  body: No supraclavicular, axillary or pectoral adenopathy.     Left upper body: No supraclavicular, axillary or pectoral adenopathy.  Skin:    General: Skin is warm and dry.     Coloration: Skin is not jaundiced or pale.     Findings: No bruising, erythema, lesion or rash.  Neurological:     General: No focal deficit present.     Mental Status: She is alert and oriented to person, place, and time. Mental status is at baseline.     Cranial Nerves: No cranial nerve deficit.     Sensory: No sensory deficit.     Motor: No weakness.     Coordination: Coordination normal.     Gait: Gait normal.     Deep Tendon Reflexes: Reflexes normal.  Psychiatric:        Mood and Affect: Mood normal.        Behavior: Behavior normal.        Thought Content: Thought content normal.        Judgment: Judgment normal.    LABS:      Latest Ref Rng & Units 01/02/2024   10:54 AM 12/12/2023    2:54 PM 05/12/2023    6:04 AM  CBC  WBC 4.0 - 10.5 K/uL 12.2  13.5  9.7   Hemoglobin 12.0 - 15.0 g/dL 9.9  8.6  9.1   Hematocrit 36.0 - 46.0 % 32.4  29.0  28.1   Platelets 150 - 400 K/uL 251  335  217       Latest Ref Rng & Units 01/02/2024   10:54 AM 12/12/2023    2:54 PM 05/22/2023   10:58 AM  CMP  Glucose 70 - 99 mg/dL 852  876    BUN 8 - 23 mg/dL 33  18    Creatinine 9.55 - 1.00 mg/dL 7.65  8.19    Sodium 864 - 145 mmol/L 140  142    Potassium 3.5 - 5.1 mmol/L 3.6  3.7    Chloride 98 - 111 mmol/L 101  106    CO2 22 - 32  mmol/L 27  23    Calcium  8.9 - 10.3 mg/dL 9.7  9.5    Total Protein 6.5 - 8.1 g/dL 6.7  6.1  5.9   Total Bilirubin 0.0 - 1.2 mg/dL 0.3  0.3  0.6   Alkaline Phos 38 - 126 U/L 105  119  95   AST 15 - 41 U/L 14  8  12    ALT 0 - 44 U/L 5  <5  13    Lab Results  Component Value Date   IRON 116 01/02/2024   TIBC 342 01/02/2024   FERRITIN 38 01/02/2024   STUDIES:  No results found.     Allergies:  Allergies  Allergen Reactions   Advil [Ibuprofen] Other (See Comments)    Intolerance due to kidney problem   Aleve [Naproxen] Other (See Comments)    Intolerance due to kidney problem    Current Medications: Current Outpatient Medications  Medication Sig Dispense Refill   acetaminophen  (TYLENOL ) 650 MG CR tablet Take 650 mg by mouth every 8 (eight) hours as needed for pain.     albuterol  (VENTOLIN  HFA) 108 (90 Base) MCG/ACT inhaler Inhale 2 puffs into the lungs every 4 (four) hours as needed for wheezing or shortness of breath.     aspirin  81 MG EC tablet Take 81 mg by mouth daily.     atorvastatin  (LIPITOR) 40 MG tablet Take 1  tablet (40 mg total) by mouth daily. 90 tablet 3   benzonatate  (TESSALON ) 200 MG capsule Take 1 capsule (200 mg total) by mouth 3 (three) times daily as needed for cough. 20 capsule 0   carvedilol  (COREG ) 12.5 MG tablet Take 1 tablet (12.5 mg total) by mouth 2 (two) times daily. 180 tablet 3   Cholecalciferol  125 MCG (5000 UT) capsule Take 5,000 Units by mouth daily.     clopidogrel  (PLAVIX ) 75 MG tablet Take 1 tablet (75 mg total) by mouth daily. 90 tablet 3   doxazosin (CARDURA) 2 MG tablet Take 2 mg by mouth daily.     esomeprazole (NEXIUM) 40 MG capsule Take 40 mg by mouth daily.     fluticasone  furoate-vilanterol (BREO ELLIPTA ) 100-25 MCG/INH AEPB Inhale 1 puff into the lungs daily.     furosemide  (LASIX ) 40 MG tablet Take 40 mg by mouth daily.     glimepiride (AMARYL) 2 MG tablet Take 2 mg by mouth daily with breakfast.     hydrALAZINE  (APRESOLINE ) 50 MG  tablet Take 1.5 tablets (75 mg total) by mouth 3 (three) times daily. 405 tablet 1   losartan  (COZAAR ) 100 MG tablet Take 100 mg by mouth daily.     nitroGLYCERIN  (NITROSTAT ) 0.4 MG SL tablet Place 0.4 mg under the tongue every 5 (five) minutes as needed for chest pain.     potassium chloride  (KLOR-CON ) 10 MEQ tablet Take 10 mEq by mouth 2 (two) times daily.     sodium bicarbonate  650 MG tablet Take 1,300 mg by mouth daily as needed for heartburn.     No current facility-administered medications for this visit.   ASSESSMENT & PLAN:  Assessment:   Stage IA hormone receptor positive breast cancer (T1a N0 M0), diagnosed in July 2020.  She remains without evidence of recurrence.  Anastrozole was stopped in February of 2023. Fortunately she had a tiny breast cancer and is at low risk for recurrence.    2.  Osteopenia, for which she takes calcium /vitamin D .  Her last bone density scan was in August 2022.  3.  Acute bronchitis vs pneumonia with leukocytosis, increased dyspnea and severe cough. Chest X-ray was unremarkable but I treated her with antibiotics and her cough has improved, but she is still very dyspneic.   4. Anemia, tests were negative for multiple myeloma or hemolytic anemia. She was definitely iron deficient and received IV iron. I think the improvement in her hemoglobin to 9.9 is largely from her iron infusion as she just received her first Procrit  3 days ago. We will need to monitor her iron stores periodically as insurance will not cover the Procrit  injections if she is iron deficient, since she will not respond as well without adequate iron stores.   5. Renal insufficiency, stable. She continues to follow with her nephrologist, Dr. Katheryn Saba, she is managing the Procrit  injections and lab appointments.   6. Fibrocystic changes and especially prominent in the upper outer quadrant of the left breast. Biopsy in August of 2024 confirmed this to be benign.    Plan: Amber [name] complains of  severe shortness of breath, she states that she cannot walk short distances or even go to the bathroom without using her oxygen . When I saw her 3 weeks ago she had a respiratory infection with increased cough and leukocytosis but chest X-ray did not show pneumonia. I placed her on antibiotics and her cough is improved and she feels slightly better but is still extremely dyspneic. Patient was  found to be iron deficient on 12/12/2023 with a iron saturation of 7% so she received IV feraheme at [facility]. I held off on ordering the procrit  as this will not be reimbursed if she is iron deficient. She informed me that she has now started Procrit  injections on 12/30/2023 and her next injection is scheduled on July 7th. She will receive this every 2 weeks at [facility] and the nephrologist is managing this and checking her CBC's regularly. She will see her again July 18th so I will hold off on follow-up but will need to check her iron store periodically. She has a elevated WBC of 12.2, low hemoglobin of 9.9 improved from 8.6, and platelet count 251,000. Her CMP is fairly normal other than a elevated creatinine of 2.34 with a BUN of 33. She also had a SPEP panel done and no M-spike was observed. I will see her back in 3 months with CBC, CMP, ferritin, iron, and TIBC. The patient understands the plans discussed today and is in agreement with them. I advised her to contact Dr. Trinidad if her respiratory symptoms worsen. She knows to contact our office if she develops concerns regarding her breast cancer.  I provided 26 minutes of face-to-face time during this this encounter and > 50% was spent counseling as documented under my assessment and plan.   Wanda VEAR Cornish, MD  [facility] A DEPT OF [facility] [address] [place] [address] Dept: [phone] Dept Fax: [phone]   No orders of the defined types were placed in this  encounter.   I,Jasmine M Lassiter,acting as a scribe for Wanda VEAR Cornish, MD.,have documented all relevant documentation on the behalf of Wanda VEAR Cornish, MD,as directed by  Wanda VEAR Cornish, MD while in the presence of Wanda VEAR Cornish, MD.   I have reviewed this report as typed by the medical scribe, and it is complete and accurate.

## 2024-01-02 NOTE — Telephone Encounter (Signed)
 Patient has been scheduled for follow-up visit per 01/01/24 LOS.  Pt noted appt details on personal planner/calendar.

## 2024-01-07 DIAGNOSIS — E1121 Type 2 diabetes mellitus with diabetic nephropathy: Secondary | ICD-10-CM | POA: Diagnosis not present

## 2024-01-07 DIAGNOSIS — I5020 Unspecified systolic (congestive) heart failure: Secondary | ICD-10-CM | POA: Diagnosis not present

## 2024-01-13 DIAGNOSIS — D631 Anemia in chronic kidney disease: Secondary | ICD-10-CM | POA: Diagnosis not present

## 2024-01-13 DIAGNOSIS — N184 Chronic kidney disease, stage 4 (severe): Secondary | ICD-10-CM | POA: Diagnosis not present

## 2024-01-20 DIAGNOSIS — N184 Chronic kidney disease, stage 4 (severe): Secondary | ICD-10-CM | POA: Diagnosis not present

## 2024-01-22 ENCOUNTER — Other Ambulatory Visit: Payer: Self-pay | Admitting: Student

## 2024-01-27 DIAGNOSIS — D631 Anemia in chronic kidney disease: Secondary | ICD-10-CM | POA: Diagnosis not present

## 2024-01-27 DIAGNOSIS — N184 Chronic kidney disease, stage 4 (severe): Secondary | ICD-10-CM | POA: Diagnosis not present

## 2024-01-28 DIAGNOSIS — R809 Proteinuria, unspecified: Secondary | ICD-10-CM | POA: Diagnosis not present

## 2024-01-28 DIAGNOSIS — N184 Chronic kidney disease, stage 4 (severe): Secondary | ICD-10-CM | POA: Diagnosis not present

## 2024-01-28 DIAGNOSIS — N261 Atrophy of kidney (terminal): Secondary | ICD-10-CM | POA: Diagnosis not present

## 2024-01-28 DIAGNOSIS — E1121 Type 2 diabetes mellitus with diabetic nephropathy: Secondary | ICD-10-CM | POA: Diagnosis not present

## 2024-01-28 DIAGNOSIS — E872 Acidosis, unspecified: Secondary | ICD-10-CM | POA: Diagnosis not present

## 2024-01-28 DIAGNOSIS — E876 Hypokalemia: Secondary | ICD-10-CM | POA: Diagnosis not present

## 2024-01-28 DIAGNOSIS — D631 Anemia in chronic kidney disease: Secondary | ICD-10-CM | POA: Diagnosis not present

## 2024-01-28 DIAGNOSIS — E1122 Type 2 diabetes mellitus with diabetic chronic kidney disease: Secondary | ICD-10-CM | POA: Diagnosis not present

## 2024-01-28 DIAGNOSIS — I129 Hypertensive chronic kidney disease with stage 1 through stage 4 chronic kidney disease, or unspecified chronic kidney disease: Secondary | ICD-10-CM | POA: Diagnosis not present

## 2024-01-28 DIAGNOSIS — E785 Hyperlipidemia, unspecified: Secondary | ICD-10-CM | POA: Diagnosis not present

## 2024-01-31 DIAGNOSIS — Z1231 Encounter for screening mammogram for malignant neoplasm of breast: Secondary | ICD-10-CM | POA: Diagnosis not present

## 2024-01-31 DIAGNOSIS — Z9011 Acquired absence of right breast and nipple: Secondary | ICD-10-CM | POA: Diagnosis not present

## 2024-01-31 DIAGNOSIS — Z853 Personal history of malignant neoplasm of breast: Secondary | ICD-10-CM | POA: Diagnosis not present

## 2024-02-06 DIAGNOSIS — Z6826 Body mass index (BMI) 26.0-26.9, adult: Secondary | ICD-10-CM | POA: Diagnosis not present

## 2024-02-06 DIAGNOSIS — E785 Hyperlipidemia, unspecified: Secondary | ICD-10-CM | POA: Diagnosis not present

## 2024-02-06 DIAGNOSIS — I129 Hypertensive chronic kidney disease with stage 1 through stage 4 chronic kidney disease, or unspecified chronic kidney disease: Secondary | ICD-10-CM | POA: Diagnosis not present

## 2024-02-06 DIAGNOSIS — N183 Chronic kidney disease, stage 3 unspecified: Secondary | ICD-10-CM | POA: Diagnosis not present

## 2024-02-06 DIAGNOSIS — J9611 Chronic respiratory failure with hypoxia: Secondary | ICD-10-CM | POA: Diagnosis not present

## 2024-02-06 DIAGNOSIS — E1122 Type 2 diabetes mellitus with diabetic chronic kidney disease: Secondary | ICD-10-CM | POA: Diagnosis not present

## 2024-02-06 DIAGNOSIS — I5020 Unspecified systolic (congestive) heart failure: Secondary | ICD-10-CM | POA: Diagnosis not present

## 2024-02-07 DIAGNOSIS — I5020 Unspecified systolic (congestive) heart failure: Secondary | ICD-10-CM | POA: Diagnosis not present

## 2024-02-07 DIAGNOSIS — E1122 Type 2 diabetes mellitus with diabetic chronic kidney disease: Secondary | ICD-10-CM | POA: Diagnosis not present

## 2024-02-10 DIAGNOSIS — N184 Chronic kidney disease, stage 4 (severe): Secondary | ICD-10-CM | POA: Diagnosis not present

## 2024-02-10 DIAGNOSIS — D631 Anemia in chronic kidney disease: Secondary | ICD-10-CM | POA: Diagnosis not present

## 2024-02-24 DIAGNOSIS — D631 Anemia in chronic kidney disease: Secondary | ICD-10-CM | POA: Diagnosis not present

## 2024-02-24 DIAGNOSIS — N184 Chronic kidney disease, stage 4 (severe): Secondary | ICD-10-CM | POA: Diagnosis not present

## 2024-03-09 DIAGNOSIS — I5020 Unspecified systolic (congestive) heart failure: Secondary | ICD-10-CM | POA: Diagnosis not present

## 2024-03-09 DIAGNOSIS — E1122 Type 2 diabetes mellitus with diabetic chronic kidney disease: Secondary | ICD-10-CM | POA: Diagnosis not present

## 2024-03-10 ENCOUNTER — Ambulatory Visit: Payer: Medicare HMO | Admitting: Oncology

## 2024-03-10 ENCOUNTER — Other Ambulatory Visit: Payer: Medicare HMO

## 2024-03-13 DIAGNOSIS — Z23 Encounter for immunization: Secondary | ICD-10-CM | POA: Diagnosis not present

## 2024-03-23 DIAGNOSIS — D631 Anemia in chronic kidney disease: Secondary | ICD-10-CM | POA: Diagnosis not present

## 2024-03-23 DIAGNOSIS — N184 Chronic kidney disease, stage 4 (severe): Secondary | ICD-10-CM | POA: Diagnosis not present

## 2024-03-24 DIAGNOSIS — Z6825 Body mass index (BMI) 25.0-25.9, adult: Secondary | ICD-10-CM | POA: Diagnosis not present

## 2024-03-24 DIAGNOSIS — Z136 Encounter for screening for cardiovascular disorders: Secondary | ICD-10-CM | POA: Diagnosis not present

## 2024-03-24 DIAGNOSIS — Z0000 Encounter for general adult medical examination without abnormal findings: Secondary | ICD-10-CM | POA: Diagnosis not present

## 2024-03-24 DIAGNOSIS — I25119 Atherosclerotic heart disease of native coronary artery with unspecified angina pectoris: Secondary | ICD-10-CM | POA: Diagnosis not present

## 2024-03-24 DIAGNOSIS — Z1389 Encounter for screening for other disorder: Secondary | ICD-10-CM | POA: Diagnosis not present

## 2024-03-24 DIAGNOSIS — Z1339 Encounter for screening examination for other mental health and behavioral disorders: Secondary | ICD-10-CM | POA: Diagnosis not present

## 2024-03-24 DIAGNOSIS — Z1331 Encounter for screening for depression: Secondary | ICD-10-CM | POA: Diagnosis not present

## 2024-03-24 DIAGNOSIS — K219 Gastro-esophageal reflux disease without esophagitis: Secondary | ICD-10-CM | POA: Diagnosis not present

## 2024-03-24 DIAGNOSIS — Z139 Encounter for screening, unspecified: Secondary | ICD-10-CM | POA: Diagnosis not present

## 2024-03-31 ENCOUNTER — Other Ambulatory Visit

## 2024-03-31 ENCOUNTER — Ambulatory Visit: Admitting: Oncology

## 2024-04-02 ENCOUNTER — Encounter: Payer: Self-pay | Admitting: Oncology

## 2024-04-02 ENCOUNTER — Inpatient Hospital Stay: Attending: Oncology

## 2024-04-02 ENCOUNTER — Other Ambulatory Visit: Payer: Self-pay | Admitting: Oncology

## 2024-04-02 ENCOUNTER — Telehealth: Payer: Self-pay | Admitting: Oncology

## 2024-04-02 ENCOUNTER — Inpatient Hospital Stay (HOSPITAL_BASED_OUTPATIENT_CLINIC_OR_DEPARTMENT_OTHER): Admitting: Oncology

## 2024-04-02 ENCOUNTER — Telehealth: Payer: Self-pay

## 2024-04-02 VITALS — BP 182/68 | HR 59 | Temp 97.7°F | Resp 18 | Ht 65.6 in | Wt 157.4 lb

## 2024-04-02 DIAGNOSIS — Z853 Personal history of malignant neoplasm of breast: Secondary | ICD-10-CM | POA: Insufficient documentation

## 2024-04-02 DIAGNOSIS — E119 Type 2 diabetes mellitus without complications: Secondary | ICD-10-CM

## 2024-04-02 DIAGNOSIS — D649 Anemia, unspecified: Secondary | ICD-10-CM | POA: Diagnosis not present

## 2024-04-02 DIAGNOSIS — C50211 Malignant neoplasm of upper-inner quadrant of right female breast: Secondary | ICD-10-CM

## 2024-04-02 LAB — CMP (CANCER CENTER ONLY)
ALT: 9 U/L (ref 0–44)
AST: 16 U/L (ref 15–41)
Albumin: 4 g/dL (ref 3.5–5.0)
Alkaline Phosphatase: 129 U/L — ABNORMAL HIGH (ref 38–126)
Anion gap: 16 — ABNORMAL HIGH (ref 5–15)
BUN: 38 mg/dL — ABNORMAL HIGH (ref 8–23)
CO2: 25 mmol/L (ref 22–32)
Calcium: 9.3 mg/dL (ref 8.9–10.3)
Chloride: 90 mmol/L — ABNORMAL LOW (ref 98–111)
Creatinine: 1.99 mg/dL — ABNORMAL HIGH (ref 0.44–1.00)
GFR, Estimated: 25 mL/min — ABNORMAL LOW (ref >60)
Glucose, Bld: 613 mg/dL (ref 70–99)
Potassium: 3.2 mmol/L — ABNORMAL LOW (ref 3.5–5.1)
Sodium: 132 mmol/L — ABNORMAL LOW (ref 135–145)
Total Bilirubin: 0.4 mg/dL (ref 0.0–1.2)
Total Protein: 6.6 g/dL (ref 6.5–8.1)

## 2024-04-02 LAB — CBC WITH DIFFERENTIAL (CANCER CENTER ONLY)
Abs Immature Granulocytes: 0.04 K/uL (ref 0.00–0.07)
Basophils Absolute: 0 K/uL (ref 0.0–0.1)
Basophils Relative: 0 %
Eosinophils Absolute: 0.2 K/uL (ref 0.0–0.5)
Eosinophils Relative: 2 %
HCT: 36 % (ref 36.0–46.0)
Hemoglobin: 12 g/dL (ref 12.0–15.0)
Immature Granulocytes: 0 %
Lymphocytes Relative: 14 %
Lymphs Abs: 1.2 K/uL (ref 0.7–4.0)
MCH: 27.8 pg (ref 26.0–34.0)
MCHC: 33.3 g/dL (ref 30.0–36.0)
MCV: 83.3 fL (ref 80.0–100.0)
Monocytes Absolute: 0.6 K/uL (ref 0.1–1.0)
Monocytes Relative: 7 %
Neutro Abs: 7 K/uL (ref 1.7–7.7)
Neutrophils Relative %: 77 %
Platelet Count: 178 K/uL (ref 150–400)
RBC: 4.32 MIL/uL (ref 3.87–5.11)
RDW: 15.3 % (ref 11.5–15.5)
WBC Count: 9.1 K/uL (ref 4.0–10.5)
nRBC: 0 % (ref 0.0–0.2)

## 2024-04-02 LAB — FERRITIN: Ferritin: 38 ng/mL (ref 11–307)

## 2024-04-02 LAB — IRON AND TIBC
Iron: 63 ug/dL (ref 28–170)
Saturation Ratios: 21 % (ref 10.4–31.8)
TIBC: 305 ug/dL (ref 250–450)
UIBC: 242 ug/dL

## 2024-04-02 MED ORDER — GLIMEPIRIDE 2 MG PO TABS: 2.0000 mg | ORAL_TABLET | Freq: Every day | ORAL | 0 refills | Status: AC

## 2024-04-02 NOTE — Telephone Encounter (Signed)
 Patient has been scheduled for follow-up visit per 04/02/24 LOS.  Pt given an appt calendar with date and time.

## 2024-04-02 NOTE — Progress Notes (Signed)
 [facility]  [address] [place],  [place]  [address] [phone]  Clinic Day:  04/02/24  Referring physician: Trinidad Hun, MD  CHIEF COMPLAINT:  CC: Stage IA hormone receptor positive right breast cancer, Anemia  Current Treatment:  Surveillance, lab evaluation  HISTORY OF PRESENT ILLNESS:  Amber [name] is a 78 y.o. 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  She was treated with a right simple mastectomy.  She had not been undergoing routine annual mammograms, then had a screening mammogram in June showing possible masses in both breasts.  Diagnostic mammogram which showed a 7 x 3 x 7 mm lobular hypoechoic mass in the right breast at the 10 o'clock position 10 cm from the nipple.  Additional bilateral oval circumscribed masses favored to represent cysts or complicated cysts were also seen.  She had a biopsy of the right breast in July, which revealed ductal carcinoma in situ partially involving a complex sclerosing lesion.  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  No atypia, in situ or invasive malignancy was identified.  She had an MRI was done in early August, which revealed a suspicious lesion in the upper inner quadrant of the right breast, as well as multiple small enhancing masses in the left breast consistent with benign lesions.  Axillary lymph nodes appeared normal.  She then had a final biopsy of the upper inner quadrant of the right breast in August.  Pathology revealed invasive ductal carcinoma, grade 1, and ductal carcinoma in situ.  Estrogen and progesterone receptors were both positive at 100% and HER2 was negative.  Ki67 was 5%.  She underwent right simple mastectomy and right axillary sentinel lymph node biopsy in September.  Three sentinel lymph nodes were negative for metastasis.   Adjuvant chemotherapy and radiation therapy were not recommended.  Bone density scan in June 2020 revealed osteopenia with a T-score of -2.1 in the left femur neck and a T-score of -1.5 in the spine, for which she is on calcium  and vitamin-D.  She was placed on anastrozole 1 mg daily in October 2020.  Left diagnostic mammogram and breast ultrasound in June revealed 3 small, benign left breast cysts, which do not need any further follow-up.  She has stage IV chronic kidney disease and is followed by a nephrologist, Dr. Jerrye. Bone density from August revealed stable to improved osteopenia with a T-score of -2.0 of the left femur neck, previously -2.1.  Dual femur total mean measures -1.7, stable.  AP Spine measures -1.5, stable. She developed severe swelling and stiffness of both hands associated with erythema and inability to use her hands. We stopped the anastrozole but it did not seem to improve. She was given two short course of corticosteroids with limited benefit and then referred to a rheumatologist. ANA and RA were negative. She takes Tylenol  arthritis and her hand issues have resolved.  She was seen back in August of 2024 and had stopped the anastrozole in February of 2023. She had a screening mammogram done on 01/14/2023 that revealed a possible asymmetry. She then had a diagnostic left breast mammogram on 01/30/2023 that revealed persistence of the asymmetry of the upper outer quadrant of the left breat. There was no palpable abnormality. Ultrasound revealed an irregular hypoechoic mass at 2 o'clock, 4 cm from the nipple and  measuring 1.0 cm, the left axilla was negative. She then had a ultrasound guided core biopsy of the mass at 2 o'clock in the left breast on 02/07/2023. Pathology revealed fibrocystic changes with usual ductal hyperplasia no atypia, in situ or invasive malignancy identified. Her B12 and folate were normal but the iron was decreased at 29 with a TIBC for a low saturation of 11.8% and  ferritin was 55.5. She tells me she is scheduled to receive IV iron tomorrow but did not know the type or how much. Her hemoglobin today is 8.6 with an MCV of 84. I will do a further evaluation for other causes.  INTERVAL HISTORY:  Amber [name] is now here for follow up of her stage IA hormone receptor positive right breast cancer and anemia. Patient states that she feels good, but she informed me that she has abdominal pain after eating. She is currently taking potassium chloride  10 mEq BID and 80 mg Lasix  daily without difficulty. In clinic today, her blood glucose was 613. She informed me that she was instructed to discontinue glimepiride  and Actos by the cardiologist. I discussed this with Dr. Landry Georgi and she recommends that she restarts glimepiride . I will prescribe 2 mg glimepiride  once daily, starting today and she will have follow-up at [facility] next week. She has a WBC of 9.1, hemoglobin of 12.0, and platelet count of 178,000. Her CMP is normal other than a low sodium of 132 down from 140, a low potassium of 3.2 down from 3.6, a low chloride of 90 down from 101, an elevated glucose of 613, an elevated BUN of 38 up from 33, an elevated creatinine of 1.99 down from 2.34, and an elevated alkaline phosphatase of 129 up from 105.  Her iron, TIBC, and ferritin are pending. Her iron, TIBC, and ferritin are pending today. I instructed her to increase her potassium chloride  to TID. I gave her copies of these labs to take with her to her other physicians. She is scheduled to visit a nephrologist and does receive erythropoietin  injections with good improvement of her hemoglobin. She will have her mammogram completed in July 2026 with Dr. Joesph. She has asked whether I can take over ordering her mammograms going forward and I agreed. I will see her back after her mammogram, in 11 months, with CBC and CMP. She denies fever, chills, night sweats, or other signs of infection. She denies cardiorespiratory  and gastrointestinal issues. She  denies pain. Her appetite is good and Her weight has increased 2 pounds over last 3 months. She is accompanied by her husband.  REVIEW OF SYSTEMS:  Review of Systems  Constitutional: Negative.  Negative for appetite change, chills, diaphoresis, fatigue, fever and unexpected weight change.  HENT:  Negative.  Negative for hearing loss, lump/mass, mouth sores, nosebleeds, sore throat, tinnitus, trouble swallowing and voice change.   Eyes: Negative.  Negative for eye problems and icterus.  Respiratory:  Negative for chest tightness, cough, hemoptysis, shortness of breath and wheezing.   Cardiovascular: Negative.  Negative for chest pain, leg swelling and palpitations.  Gastrointestinal:  Positive for abdominal pain (after meals). Negative for abdominal distention, blood in stool, constipation, diarrhea, nausea, rectal pain and vomiting.  Endocrine: Negative.   Genitourinary: Negative.  Negative for bladder incontinence, difficulty urinating, dyspareunia, dysuria, frequency, hematuria, menstrual problem, nocturia, pelvic pain, vaginal bleeding and vaginal discharge.   Musculoskeletal:  Negative for arthralgias, back pain, flank pain, gait problem, myalgias, neck pain and neck stiffness.  Skin: Negative.  Negative for itching, rash and wound.  Neurological:  Positive for numbness (and paresthesias of the bilateral hands). Negative for dizziness, extremity weakness, gait problem, headaches, light-headedness, seizures and speech difficulty.  Hematological: Negative.  Negative for adenopathy. Does not bruise/bleed easily.  Psychiatric/Behavioral: Negative.  Negative for confusion, decreased concentration, depression, sleep disturbance and suicidal ideas. The patient is not nervous/anxious.     VITALS:  Blood pressure (!) 182/68, pulse (!) 59, temperature 97.7 F (36.5 C), temperature source Oral, resp. rate 18, height 5' 5.6 (1.666 m), weight 157 lb 6.4 oz (71.4 kg),  SpO2 95%.  Wt Readings from Last 3 Encounters:  04/02/24 157 lb 6.4 oz (71.4 kg)  01/02/24 159 lb 9.6 oz (72.4 kg)  12/12/23 168 lb 4.8 oz (76.3 kg)    Body mass index is 25.72 kg/m.  Performance status (ECOG): 1 - Symptomatic but completely ambulatory  PHYSICAL EXAM:  Physical Exam Constitutional:      General: She is not in acute distress.    Appearance: Normal appearance. She is normal weight. She is not ill-appearing, toxic-appearing or diaphoretic.  HENT:     Head: Normocephalic and atraumatic.     Right Ear: Tympanic membrane, ear canal and external ear normal. There is no impacted cerumen.     Left Ear: Tympanic membrane, ear canal and external ear normal. There is no impacted cerumen.     Nose: Nose normal. No congestion or rhinorrhea.     Mouth/Throat:     Mouth: Mucous membranes are moist.     Pharynx: Oropharynx is clear. No oropharyngeal exudate or posterior oropharyngeal erythema.  Eyes:     General: No scleral icterus.       Right eye: No discharge.        Left eye: No discharge.     Extraocular Movements: Extraocular movements intact.     Conjunctiva/sclera: Conjunctivae normal.     Pupils: Pupils are equal, round, and reactive to light.  Neck:     Vascular: No carotid bruit.  Cardiovascular:     Rate and Rhythm: Normal rate and regular rhythm.     Pulses: Normal pulses.     Heart sounds: Normal heart sounds. No murmur heard.    No friction rub. No gallop.  Pulmonary:     Effort: Pulmonary effort is normal. No respiratory distress.     Breath sounds: Normal breath sounds. No stridor. No wheezing, rhonchi or rales.  Chest:     Chest wall: No tenderness.     Comments: Moderate fibrocystic changes in the left breast but no masses.  Right mastectomy is negative. Abdominal:     General: Bowel sounds are normal. There is no distension.     Palpations: Abdomen is soft. There is no hepatomegaly, splenomegaly or mass.     Tenderness: There is no abdominal  tenderness. There is no right CVA tenderness, left CVA tenderness, guarding or rebound.     Hernia: No hernia is present.  Musculoskeletal:        General: No swelling, tenderness, deformity or signs of injury. Normal range of motion.     Right hand: No swelling.     Left hand: No swelling.     Cervical back: Normal range of motion and neck supple. No rigidity or tenderness.     Right lower leg: No edema.     Left lower leg: No edema.  Lymphadenopathy:     Cervical: No cervical adenopathy.     Right cervical: No superficial, deep or  posterior cervical adenopathy.    Left cervical: No superficial, deep or posterior cervical adenopathy.     Upper Body:     Right upper body: No supraclavicular, axillary or pectoral adenopathy.     Left upper body: No supraclavicular, axillary or pectoral adenopathy.  Skin:    General: Skin is warm and dry.     Coloration: Skin is not jaundiced or pale.     Findings: No bruising, erythema, lesion or rash.  Neurological:     General: No focal deficit present.     Mental Status: She is alert and oriented to person, place, and time. Mental status is at baseline.     Cranial Nerves: No cranial nerve deficit.     Sensory: No sensory deficit.     Motor: No weakness.     Coordination: Coordination normal.     Gait: Gait normal.     Deep Tendon Reflexes: Reflexes normal.  Psychiatric:        Mood and Affect: Mood normal.        Behavior: Behavior normal.        Thought Content: Thought content normal.        Judgment: Judgment normal.    LABS:      Latest Ref Rng & Units 04/02/2024    2:21 PM 01/02/2024   10:54 AM 12/12/2023    2:54 PM  CBC  WBC 4.0 - 10.5 K/uL 9.1  12.2  13.5   Hemoglobin 12.0 - 15.0 g/dL 87.9  9.9  8.6   Hematocrit 36.0 - 46.0 % 36.0  32.4  29.0   Platelets 150 - 400 K/uL 178  251  335       Latest Ref Rng & Units 04/02/2024    2:21 PM 01/02/2024   10:54 AM 12/12/2023    2:54 PM  CMP  Glucose 70 - 99 mg/dL 386  852  876   BUN 8  - 23 mg/dL 38  33  18   Creatinine 0.44 - 1.00 mg/dL 8.00  7.65  8.19   Sodium 135 - 145 mmol/L 132  140  142   Potassium 3.5 - 5.1 mmol/L 3.2  3.6  3.7   Chloride 98 - 111 mmol/L 90  101  106   CO2 22 - 32 mmol/L 25  27  23    Calcium  8.9 - 10.3 mg/dL 9.3  9.7  9.5   Total Protein 6.5 - 8.1 g/dL 6.6  6.7  6.1   Total Bilirubin 0.0 - 1.2 mg/dL 0.4  0.3  0.3   Alkaline Phos 38 - 126 U/L 129  105  119   AST 15 - 41 U/L 16  14  8    ALT 0 - 44 U/L 9  5  <5    Lab Results  Component Value Date   IRON 63 04/02/2024   TIBC 305 04/02/2024   FERRITIN 38 04/02/2024   STUDIES:  No results found.     Allergies:  Allergies  Allergen Reactions   Advil [Ibuprofen] Other (See Comments)    Intolerance due to kidney problem   Aleve [Naproxen] Other (See Comments)    Intolerance due to kidney problem    Current Medications: Current Outpatient Medications  Medication Sig Dispense Refill   dexlansoprazole (DEXILANT) 60 MG capsule Take 1 capsule by mouth daily.     furosemide  (LASIX ) 80 MG tablet Take by mouth.     acetaminophen  (TYLENOL ) 650 MG CR tablet Take 650 mg by mouth every  8 (eight) hours as needed for pain.     albuterol  (VENTOLIN  HFA) 108 (90 Base) MCG/ACT inhaler Inhale 2 puffs into the lungs every 4 (four) hours as needed for wheezing or shortness of breath.     aspirin  81 MG EC tablet Take 81 mg by mouth daily.     atorvastatin  (LIPITOR) 40 MG tablet Take 1 tablet (40 mg total) by mouth daily. 90 tablet 3   benzonatate  (TESSALON ) 200 MG capsule Take 1 capsule (200 mg total) by mouth 3 (three) times daily as needed for cough. 20 capsule 0   carvedilol  (COREG ) 12.5 MG tablet Take 1 tablet (12.5 mg total) by mouth 2 (two) times daily. 180 tablet 3   Cholecalciferol  125 MCG (5000 UT) capsule Take 5,000 Units by mouth daily.     clopidogrel  (PLAVIX ) 75 MG tablet Take 1 tablet (75 mg total) by mouth daily. 90 tablet 3   doxazosin (CARDURA) 2 MG tablet Take 2 mg by mouth daily.      esomeprazole (NEXIUM) 40 MG capsule Take 40 mg by mouth daily.     fluticasone  furoate-vilanterol (BREO ELLIPTA ) 100-25 MCG/INH AEPB Inhale 1 puff into the lungs daily.     glimepiride  (AMARYL ) 2 MG tablet Take 1 tablet (2 mg total) by mouth daily with breakfast. 30 tablet 0   hydrALAZINE  (APRESOLINE ) 50 MG tablet TAKE 1 AND 1/2 TABLETS (75MG ) THREE TIMES DAILY. 90 tablet 3   losartan  (COZAAR ) 100 MG tablet Take 100 mg by mouth daily.     nitroGLYCERIN  (NITROSTAT ) 0.4 MG SL tablet Place 0.4 mg under the tongue every 5 (five) minutes as needed for chest pain.     potassium chloride  (KLOR-CON ) 10 MEQ tablet Take 10 mEq by mouth 2 (two) times daily.     sodium bicarbonate  650 MG tablet Take 1,300 mg by mouth daily as needed for heartburn.     No current facility-administered medications for this visit.   ASSESSMENT & PLAN:  Assessment:   Stage IA hormone receptor positive breast cancer (T1a N0 M0), diagnosed in July 2020.  She remains without evidence of recurrence.  Anastrozole was stopped in February of 2023. Fortunately she had a tiny breast cancer and is at low risk for recurrence.    2.  Osteopenia, for which she takes calcium /vitamin D .  Her last bone density scan was in August 2022. I will suggest she have one sometime soon if it has not already been done.   3. Anemia, tests were negative for multiple myeloma or hemolytic anemia. She was definitely iron deficient and received IV iron with definite improvement. We will need to monitor her iron stores periodically as insurance will not cover the Procrit  injections if she is iron deficient, since she will not respond as well without adequate iron stores.   4. Renal insufficiency, stable. She continues to follow with her nephrologist, Dr. Katheryn Saba, she is managing the Procrit  injections and lab appointments.   5. Fibrocystic changes and especially prominent in the lower left breast. Biopsy in August of 2024 confirmed this to be benign.   6.  Hypokalemia. I am sure this is from her high doses of Lasix , but I do not want to change this. I recommended she increase her potassium chloride  to 10 mEq TID.  7. Uncontrolled diabetes with a blood sugar of 613. The cardiologist had recommended a cessation of Actos, but her glimepiride  was also stopped in the process so now her sugars are out of control since she is  not on therapy. I conferred with Dr. Landry Georgi, her PCP, who requested we resume the glimepiride  at 2 mg daily and her office will follow up next week.  I will give her a copy of her labs to take with her.   Plan: She informed me that she has abdominal pain after eating. She is currently taking potassium chloride  10 mEq BID and 80 mg Lasix  daily without difficulty. In clinic today, her blood glucose was 613. She informed me that she was instructed to discontinue glimepiride  and Actos. I discussed this with Dr. Landry Georgi and she recommends that she restarts glimepiride . I will prescribe 2 mg glimepiride  once daily, starting today and she will have follow-up at [facility] next week. She has a WBC of 9.1, hemoglobin of 12.0, and platelet count of 178,000. Her CMP is normal other than a low sodium of 132 down from 140, a low potassium of 3.2 down from 3.6, a low chloride of 90 down from 101, an elevated glucose of 613, an elevated BUN of 38 up from 33, an elevated creatinine of 1.99 down from 2.34, and an elevated alkaline phosphatase of 129 up from 105.  Her iron, TIBC, and ferritin are pending. Her iron, TIBC, and ferritin are pending today. I instructed her to begin taking her potassium chloride  TID. I gave her copies of these labs to take with her to her other physicians. She is scheduled to visit her nephrologist and does receive erythropoietin  injections with good improvement of her hemoglobin. She will have her mammogram completed in July 2026 with Dr. Joesph. She has asked whether I can take over ordering her mammograms going  forward and I agreed. I will see her back after her mammogram, in 11 months, with CBC and CMP. The patient understands the plans discussed today and is in agreement with them. I advised her to contact Dr. Georgi if her respiratory symptoms worsen. She knows to contact our office if she develops concerns regarding her breast cancer.  I provided 24 minutes of face-to-face time during this this encounter and > 50% was spent counseling as documented under my assessment and plan.   Wanda VEAR Cornish, MD   [facility] - A DEPT OF [facility] [address] [place] [address] Dept: [phone] Dept Fax: [phone]   I have reviewed this report as typed by the medical scribe, and it is complete and accurate.

## 2024-04-02 NOTE — Telephone Encounter (Signed)
 CRITICAL VALUE STICKER  CRITICAL VALUE:  Glucose 613  RECEIVER (on-site recipient of call): Verlon Pischke,RN  DATE & TIME NOTIFIED: 04/02/24 @1507   MESSENGER (representative from lab):I'm Barr  MD NOTIFIED: Dr Cornelius  TIME OF NOTIFICATION: 1510  RESPONSE:  Dr in with pt @ present.

## 2024-04-06 DIAGNOSIS — E1122 Type 2 diabetes mellitus with diabetic chronic kidney disease: Secondary | ICD-10-CM | POA: Diagnosis not present

## 2024-04-06 DIAGNOSIS — I129 Hypertensive chronic kidney disease with stage 1 through stage 4 chronic kidney disease, or unspecified chronic kidney disease: Secondary | ICD-10-CM | POA: Diagnosis not present

## 2024-04-06 DIAGNOSIS — N183 Chronic kidney disease, stage 3 unspecified: Secondary | ICD-10-CM | POA: Diagnosis not present

## 2024-04-06 DIAGNOSIS — Z6825 Body mass index (BMI) 25.0-25.9, adult: Secondary | ICD-10-CM | POA: Diagnosis not present

## 2024-04-06 DIAGNOSIS — J9611 Chronic respiratory failure with hypoxia: Secondary | ICD-10-CM | POA: Diagnosis not present

## 2024-04-08 DIAGNOSIS — E1122 Type 2 diabetes mellitus with diabetic chronic kidney disease: Secondary | ICD-10-CM | POA: Diagnosis not present

## 2024-04-08 DIAGNOSIS — I5020 Unspecified systolic (congestive) heart failure: Secondary | ICD-10-CM | POA: Diagnosis not present

## 2024-04-13 DIAGNOSIS — Z6825 Body mass index (BMI) 25.0-25.9, adult: Secondary | ICD-10-CM | POA: Diagnosis not present

## 2024-04-13 DIAGNOSIS — K449 Diaphragmatic hernia without obstruction or gangrene: Secondary | ICD-10-CM | POA: Diagnosis not present

## 2024-04-13 DIAGNOSIS — I129 Hypertensive chronic kidney disease with stage 1 through stage 4 chronic kidney disease, or unspecified chronic kidney disease: Secondary | ICD-10-CM | POA: Diagnosis not present

## 2024-04-13 DIAGNOSIS — N183 Chronic kidney disease, stage 3 unspecified: Secondary | ICD-10-CM | POA: Diagnosis not present

## 2024-04-13 DIAGNOSIS — E1122 Type 2 diabetes mellitus with diabetic chronic kidney disease: Secondary | ICD-10-CM | POA: Diagnosis not present

## 2024-04-20 DIAGNOSIS — D631 Anemia in chronic kidney disease: Secondary | ICD-10-CM | POA: Diagnosis not present

## 2024-04-20 DIAGNOSIS — N184 Chronic kidney disease, stage 4 (severe): Secondary | ICD-10-CM | POA: Diagnosis not present

## 2024-04-21 DIAGNOSIS — N184 Chronic kidney disease, stage 4 (severe): Secondary | ICD-10-CM | POA: Diagnosis not present

## 2024-04-23 DIAGNOSIS — Z6825 Body mass index (BMI) 25.0-25.9, adult: Secondary | ICD-10-CM | POA: Diagnosis not present

## 2024-04-23 DIAGNOSIS — N183 Chronic kidney disease, stage 3 unspecified: Secondary | ICD-10-CM | POA: Diagnosis not present

## 2024-04-23 DIAGNOSIS — E1122 Type 2 diabetes mellitus with diabetic chronic kidney disease: Secondary | ICD-10-CM | POA: Diagnosis not present

## 2024-04-23 DIAGNOSIS — I129 Hypertensive chronic kidney disease with stage 1 through stage 4 chronic kidney disease, or unspecified chronic kidney disease: Secondary | ICD-10-CM | POA: Diagnosis not present

## 2024-04-23 DIAGNOSIS — N184 Chronic kidney disease, stage 4 (severe): Secondary | ICD-10-CM | POA: Diagnosis not present

## 2024-04-29 DIAGNOSIS — D631 Anemia in chronic kidney disease: Secondary | ICD-10-CM | POA: Diagnosis not present

## 2024-04-29 DIAGNOSIS — E1122 Type 2 diabetes mellitus with diabetic chronic kidney disease: Secondary | ICD-10-CM | POA: Diagnosis not present

## 2024-04-29 DIAGNOSIS — E876 Hypokalemia: Secondary | ICD-10-CM | POA: Diagnosis not present

## 2024-04-29 DIAGNOSIS — N184 Chronic kidney disease, stage 4 (severe): Secondary | ICD-10-CM | POA: Diagnosis not present

## 2024-04-29 DIAGNOSIS — R809 Proteinuria, unspecified: Secondary | ICD-10-CM | POA: Diagnosis not present

## 2024-04-29 DIAGNOSIS — I129 Hypertensive chronic kidney disease with stage 1 through stage 4 chronic kidney disease, or unspecified chronic kidney disease: Secondary | ICD-10-CM | POA: Diagnosis not present

## 2024-04-29 DIAGNOSIS — E872 Acidosis, unspecified: Secondary | ICD-10-CM | POA: Diagnosis not present

## 2024-04-29 DIAGNOSIS — N261 Atrophy of kidney (terminal): Secondary | ICD-10-CM | POA: Diagnosis not present

## 2024-05-09 DIAGNOSIS — E1122 Type 2 diabetes mellitus with diabetic chronic kidney disease: Secondary | ICD-10-CM | POA: Diagnosis not present

## 2024-05-09 DIAGNOSIS — I5020 Unspecified systolic (congestive) heart failure: Secondary | ICD-10-CM | POA: Diagnosis not present

## 2024-05-13 DIAGNOSIS — J449 Chronic obstructive pulmonary disease, unspecified: Secondary | ICD-10-CM | POA: Diagnosis not present

## 2024-05-13 DIAGNOSIS — E1121 Type 2 diabetes mellitus with diabetic nephropathy: Secondary | ICD-10-CM | POA: Diagnosis not present

## 2024-05-13 DIAGNOSIS — Z6825 Body mass index (BMI) 25.0-25.9, adult: Secondary | ICD-10-CM | POA: Diagnosis not present

## 2024-05-18 DIAGNOSIS — E1121 Type 2 diabetes mellitus with diabetic nephropathy: Secondary | ICD-10-CM | POA: Diagnosis not present

## 2024-05-18 DIAGNOSIS — Z6825 Body mass index (BMI) 25.0-25.9, adult: Secondary | ICD-10-CM | POA: Diagnosis not present

## 2024-05-18 DIAGNOSIS — Z23 Encounter for immunization: Secondary | ICD-10-CM | POA: Diagnosis not present

## 2024-05-18 DIAGNOSIS — I25119 Atherosclerotic heart disease of native coronary artery with unspecified angina pectoris: Secondary | ICD-10-CM | POA: Diagnosis not present

## 2024-05-18 DIAGNOSIS — N184 Chronic kidney disease, stage 4 (severe): Secondary | ICD-10-CM | POA: Diagnosis not present

## 2024-05-19 DIAGNOSIS — K219 Gastro-esophageal reflux disease without esophagitis: Secondary | ICD-10-CM | POA: Diagnosis not present

## 2024-05-22 DIAGNOSIS — J020 Streptococcal pharyngitis: Secondary | ICD-10-CM | POA: Diagnosis not present

## 2024-05-22 DIAGNOSIS — R051 Acute cough: Secondary | ICD-10-CM | POA: Diagnosis not present

## 2024-06-07 DIAGNOSIS — E1121 Type 2 diabetes mellitus with diabetic nephropathy: Secondary | ICD-10-CM | POA: Diagnosis not present

## 2024-06-08 DIAGNOSIS — I5020 Unspecified systolic (congestive) heart failure: Secondary | ICD-10-CM | POA: Diagnosis not present

## 2024-06-08 DIAGNOSIS — E1121 Type 2 diabetes mellitus with diabetic nephropathy: Secondary | ICD-10-CM | POA: Diagnosis not present

## 2024-07-09 ENCOUNTER — Other Ambulatory Visit: Payer: Self-pay

## 2025-02-10 ENCOUNTER — Ambulatory Visit: Admitting: Oncology

## 2025-02-10 ENCOUNTER — Other Ambulatory Visit
